# Patient Record
Sex: Female | Born: 1940
Health system: Southern US, Community
[De-identification: ages and names within clinical notes are randomized; demographics above are authoritative.]

## PROBLEM LIST (undated history)

## (undated) DIAGNOSIS — K589 Irritable bowel syndrome without diarrhea: Secondary | ICD-10-CM

## (undated) DIAGNOSIS — N189 Chronic kidney disease, unspecified: Secondary | ICD-10-CM

## (undated) DIAGNOSIS — R7301 Impaired fasting glucose: Secondary | ICD-10-CM

## (undated) DIAGNOSIS — K649 Unspecified hemorrhoids: Secondary | ICD-10-CM

## (undated) DIAGNOSIS — L409 Psoriasis, unspecified: Secondary | ICD-10-CM

## (undated) DIAGNOSIS — I10 Essential (primary) hypertension: Secondary | ICD-10-CM

## (undated) DIAGNOSIS — M1990 Unspecified osteoarthritis, unspecified site: Secondary | ICD-10-CM

## (undated) DIAGNOSIS — Z87442 Personal history of urinary calculi: Secondary | ICD-10-CM

## (undated) DIAGNOSIS — J4520 Mild intermittent asthma, uncomplicated: Secondary | ICD-10-CM

## (undated) DIAGNOSIS — K219 Gastro-esophageal reflux disease without esophagitis: Secondary | ICD-10-CM

## (undated) HISTORY — DX: Psoriasis, unspecified: L40.9

## (undated) HISTORY — DX: Unspecified hemorrhoids: K64.9

## (undated) HISTORY — PX: CHOLECYSTECTOMY: SHX55

## (undated) HISTORY — DX: Mild intermittent asthma, uncomplicated: J45.20

## (undated) HISTORY — PX: TONSILLECTOMY: SUR1361

## (undated) HISTORY — PX: HEMORRHOID SURGERY: SHX153

## (undated) HISTORY — DX: Irritable bowel syndrome, unspecified: K58.9

## (undated) HISTORY — DX: Unspecified osteoarthritis, unspecified site: M19.90

## (undated) HISTORY — DX: Gastro-esophageal reflux disease without esophagitis: K21.9

---
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
## 1985-06-09 HISTORY — PX: ABDOMINAL HYSTERECTOMY: SHX81

## 1997-10-23 ENCOUNTER — Ambulatory Visit (HOSPITAL_COMMUNITY): Admission: RE | Admit: 1997-10-23 | Discharge: 1997-10-23 | Payer: Self-pay | Admitting: Orthopedic Surgery

## 2009-06-12 DIAGNOSIS — M159 Polyosteoarthritis, unspecified: Secondary | ICD-10-CM | POA: Insufficient documentation

## 2010-02-25 DIAGNOSIS — J309 Allergic rhinitis, unspecified: Secondary | ICD-10-CM | POA: Insufficient documentation

## 2010-02-25 DIAGNOSIS — K219 Gastro-esophageal reflux disease without esophagitis: Secondary | ICD-10-CM | POA: Insufficient documentation

## 2010-02-25 DIAGNOSIS — L409 Psoriasis, unspecified: Secondary | ICD-10-CM | POA: Insufficient documentation

## 2010-05-23 DIAGNOSIS — N183 Chronic kidney disease, stage 3 unspecified: Secondary | ICD-10-CM | POA: Insufficient documentation

## 2010-05-23 DIAGNOSIS — I10 Essential (primary) hypertension: Secondary | ICD-10-CM | POA: Insufficient documentation

## 2010-06-20 ENCOUNTER — Encounter
Admission: RE | Admit: 2010-06-20 | Discharge: 2010-06-20 | Payer: Self-pay | Source: Home / Self Care | Attending: Family Medicine | Admitting: Family Medicine

## 2010-07-01 ENCOUNTER — Encounter
Admission: RE | Admit: 2010-07-01 | Discharge: 2010-07-01 | Payer: Self-pay | Source: Home / Self Care | Attending: Family Medicine | Admitting: Family Medicine

## 2010-07-08 DIAGNOSIS — K580 Irritable bowel syndrome with diarrhea: Secondary | ICD-10-CM | POA: Insufficient documentation

## 2011-06-25 ENCOUNTER — Other Ambulatory Visit: Payer: Self-pay | Admitting: Family Medicine

## 2011-06-25 DIAGNOSIS — L719 Rosacea, unspecified: Secondary | ICD-10-CM | POA: Insufficient documentation

## 2011-06-25 DIAGNOSIS — Z1231 Encounter for screening mammogram for malignant neoplasm of breast: Secondary | ICD-10-CM

## 2011-07-04 ENCOUNTER — Ambulatory Visit: Payer: Self-pay

## 2011-07-04 ENCOUNTER — Ambulatory Visit
Admission: RE | Admit: 2011-07-04 | Discharge: 2011-07-04 | Disposition: A | Discharge status: Home / Self Care | Payer: Medicare Other | Source: Ambulatory Visit | Attending: Family Medicine | Admitting: Family Medicine

## 2011-07-04 DIAGNOSIS — Z1231 Encounter for screening mammogram for malignant neoplasm of breast: Secondary | ICD-10-CM

## 2011-09-22 DIAGNOSIS — E781 Pure hyperglyceridemia: Secondary | ICD-10-CM | POA: Insufficient documentation

## 2012-07-02 ENCOUNTER — Other Ambulatory Visit: Payer: Self-pay | Admitting: Family Medicine

## 2012-07-02 DIAGNOSIS — Z780 Asymptomatic menopausal state: Secondary | ICD-10-CM

## 2012-07-02 DIAGNOSIS — Z1231 Encounter for screening mammogram for malignant neoplasm of breast: Secondary | ICD-10-CM

## 2012-07-05 ENCOUNTER — Ambulatory Visit
Admission: RE | Admit: 2012-07-05 | Discharge: 2012-07-05 | Disposition: A | Discharge status: Home / Self Care | Payer: Medicare Other | Source: Ambulatory Visit | Attending: Family Medicine | Admitting: Family Medicine

## 2012-07-05 DIAGNOSIS — Z780 Asymptomatic menopausal state: Secondary | ICD-10-CM

## 2012-07-05 DIAGNOSIS — Z1231 Encounter for screening mammogram for malignant neoplasm of breast: Secondary | ICD-10-CM

## 2012-10-01 DIAGNOSIS — H1045 Other chronic allergic conjunctivitis: Secondary | ICD-10-CM | POA: Insufficient documentation

## 2013-04-06 ENCOUNTER — Other Ambulatory Visit: Payer: Self-pay | Admitting: Family Medicine

## 2013-04-06 DIAGNOSIS — Z1231 Encounter for screening mammogram for malignant neoplasm of breast: Secondary | ICD-10-CM

## 2013-06-09 HISTORY — PX: CATARACT EXTRACTION W/ INTRAOCULAR LENS  IMPLANT, BILATERAL: SHX1307

## 2013-07-07 ENCOUNTER — Ambulatory Visit
Admission: RE | Admit: 2013-07-07 | Discharge: 2013-07-07 | Disposition: A | Discharge status: Home / Self Care | Payer: Medicare Other | Source: Ambulatory Visit | Attending: Family Medicine | Admitting: Family Medicine

## 2013-07-07 DIAGNOSIS — Z1231 Encounter for screening mammogram for malignant neoplasm of breast: Secondary | ICD-10-CM

## 2014-10-05 ENCOUNTER — Other Ambulatory Visit: Payer: Self-pay | Admitting: Family Medicine

## 2014-10-05 DIAGNOSIS — Z1231 Encounter for screening mammogram for malignant neoplasm of breast: Secondary | ICD-10-CM

## 2014-12-05 ENCOUNTER — Encounter (INDEPENDENT_AMBULATORY_CARE_PROVIDER_SITE_OTHER): Payer: Self-pay

## 2014-12-05 ENCOUNTER — Ambulatory Visit
Admission: RE | Admit: 2014-12-05 | Discharge: 2014-12-05 | Disposition: A | Discharge status: Home / Self Care | Payer: Medicare Other | Source: Ambulatory Visit | Attending: Family Medicine | Admitting: Family Medicine

## 2014-12-05 DIAGNOSIS — Z1231 Encounter for screening mammogram for malignant neoplasm of breast: Secondary | ICD-10-CM

## 2015-04-16 DIAGNOSIS — E669 Obesity, unspecified: Secondary | ICD-10-CM | POA: Insufficient documentation

## 2016-03-07 ENCOUNTER — Other Ambulatory Visit: Payer: Self-pay | Admitting: Family Medicine

## 2016-03-07 DIAGNOSIS — I10 Essential (primary) hypertension: Secondary | ICD-10-CM

## 2016-03-07 DIAGNOSIS — R0609 Other forms of dyspnea: Secondary | ICD-10-CM

## 2016-03-21 ENCOUNTER — Other Ambulatory Visit: Payer: Self-pay | Admitting: Family Medicine

## 2016-03-21 DIAGNOSIS — R0602 Shortness of breath: Secondary | ICD-10-CM

## 2016-03-21 DIAGNOSIS — R9431 Abnormal electrocardiogram [ECG] [EKG]: Secondary | ICD-10-CM

## 2016-03-26 ENCOUNTER — Telehealth (HOSPITAL_COMMUNITY): Payer: Self-pay | Admitting: *Deleted

## 2016-03-26 NOTE — Telephone Encounter (Signed)
Patient given detailed instructions per Myocardial Perfusion Study Information Sheet for the test on 03/31/16 Patient notified to arrive 15 minutes early and that it is imperative to arrive on time for appointment to keep from having the test rescheduled.  If you need to cancel or reschedule your appointment, please call the office within 24 hours of your appointment. Failure to do so may result in a cancellation of your appointment, and a $50 no show fee. Patient verbalized understanding   Kirstie Peri

## 2016-03-31 ENCOUNTER — Other Ambulatory Visit (HOSPITAL_COMMUNITY): Payer: BLUE CROSS/BLUE SHIELD

## 2016-03-31 ENCOUNTER — Ambulatory Visit (HOSPITAL_COMMUNITY): Payer: Medicare Other | Attending: Cardiovascular Disease

## 2016-03-31 DIAGNOSIS — R0602 Shortness of breath: Secondary | ICD-10-CM | POA: Diagnosis not present

## 2016-03-31 DIAGNOSIS — R0600 Dyspnea, unspecified: Secondary | ICD-10-CM | POA: Insufficient documentation

## 2016-03-31 DIAGNOSIS — R9431 Abnormal electrocardiogram [ECG] [EKG]: Secondary | ICD-10-CM | POA: Diagnosis not present

## 2016-03-31 MED ORDER — TECHNETIUM TC 99M TETROFOSMIN IV KIT
32.8000 | PACK | Freq: Once | INTRAVENOUS | Status: AC | PRN
  Administered 2016-03-31: 32.8 via INTRAVENOUS
  Filled 2016-03-31: qty 33

## 2016-03-31 MED ORDER — REGADENOSON 0.4 MG/5ML IV SOLN
0.4000 mg | Freq: Once | INTRAVENOUS | Status: AC
  Administered 2016-03-31: 0.4 mg via INTRAVENOUS

## 2016-04-01 ENCOUNTER — Ambulatory Visit (HOSPITAL_COMMUNITY): Payer: Medicare Other | Attending: Cardiology

## 2016-04-01 LAB — MYOCARDIAL PERFUSION IMAGING
CHL CUP NUCLEAR SSS: 8
CHL CUP RESTING HR STRESS: 65 {beats}/min
CSEPPHR: 83 {beats}/min
LHR: 0.39
SDS: 6
SRS: 4
TID: 0.87

## 2016-04-01 MED ORDER — TECHNETIUM TC 99M TETROFOSMIN IV KIT
31.7000 | PACK | Freq: Once | INTRAVENOUS | Status: AC | PRN
Start: 2016-04-01 — End: 2016-04-01
  Administered 2016-04-01: 31.7 via INTRAVENOUS
  Filled 2016-04-01: qty 32

## 2016-10-11 ENCOUNTER — Encounter (HOSPITAL_COMMUNITY): Payer: Self-pay | Admitting: Emergency Medicine

## 2016-10-11 ENCOUNTER — Emergency Department (HOSPITAL_COMMUNITY)
Admission: EM | Admit: 2016-10-11 | Discharge: 2016-10-11 | Disposition: A | Discharge status: Home / Self Care | Payer: Medicare Other | Attending: Emergency Medicine | Admitting: Emergency Medicine

## 2016-10-11 DIAGNOSIS — N12 Tubulo-interstitial nephritis, not specified as acute or chronic: Secondary | ICD-10-CM | POA: Diagnosis not present

## 2016-10-11 DIAGNOSIS — N179 Acute kidney failure, unspecified: Secondary | ICD-10-CM | POA: Diagnosis not present

## 2016-10-11 DIAGNOSIS — N390 Urinary tract infection, site not specified: Secondary | ICD-10-CM | POA: Diagnosis not present

## 2016-10-11 DIAGNOSIS — Z7982 Long term (current) use of aspirin: Secondary | ICD-10-CM | POA: Insufficient documentation

## 2016-10-11 DIAGNOSIS — I10 Essential (primary) hypertension: Secondary | ICD-10-CM | POA: Insufficient documentation

## 2016-10-11 DIAGNOSIS — R509 Fever, unspecified: Secondary | ICD-10-CM | POA: Diagnosis present

## 2016-10-11 HISTORY — DX: Essential (primary) hypertension: I10

## 2016-10-11 LAB — COMPREHENSIVE METABOLIC PANEL
ALT: 14 U/L (ref 14–54)
ANION GAP: 10 (ref 5–15)
AST: 28 U/L (ref 15–41)
Albumin: 3.6 g/dL (ref 3.5–5.0)
Alkaline Phosphatase: 58 U/L (ref 38–126)
BILIRUBIN TOTAL: 1.4 mg/dL — AB (ref 0.3–1.2)
BUN: 33 mg/dL — AB (ref 6–20)
CHLORIDE: 107 mmol/L (ref 101–111)
CO2: 19 mmol/L — ABNORMAL LOW (ref 22–32)
Calcium: 9.3 mg/dL (ref 8.9–10.3)
Creatinine, Ser: 2.24 mg/dL — ABNORMAL HIGH (ref 0.44–1.00)
GFR, EST AFRICAN AMERICAN: 23 mL/min — AB (ref >60)
GFR, EST NON AFRICAN AMERICAN: 20 mL/min — AB (ref >60)
Glucose, Bld: 104 mg/dL — ABNORMAL HIGH (ref 65–99)
POTASSIUM: 4.4 mmol/L (ref 3.5–5.1)
Sodium: 136 mmol/L (ref 135–145)
TOTAL PROTEIN: 7.4 g/dL (ref 6.5–8.1)

## 2016-10-11 LAB — URINALYSIS, ROUTINE W REFLEX MICROSCOPIC
BILIRUBIN URINE: NEGATIVE
GLUCOSE, UA: NEGATIVE mg/dL
KETONES UR: NEGATIVE mg/dL
Nitrite: NEGATIVE
PH: 5 (ref 5.0–8.0)
PROTEIN: 100 mg/dL — AB
Specific Gravity, Urine: 1.015 (ref 1.005–1.030)

## 2016-10-11 LAB — CBC WITH DIFFERENTIAL/PLATELET
BASOS ABS: 0 10*3/uL (ref 0.0–0.1)
Basophils Relative: 0 %
EOS PCT: 1 %
Eosinophils Absolute: 0.1 10*3/uL (ref 0.0–0.7)
HCT: 37.3 % (ref 36.0–46.0)
HEMOGLOBIN: 12.3 g/dL (ref 12.0–15.0)
LYMPHS ABS: 2.4 10*3/uL (ref 0.7–4.0)
LYMPHS PCT: 19 %
MCH: 31.5 pg (ref 26.0–34.0)
MCHC: 33 g/dL (ref 30.0–36.0)
MCV: 95.4 fL (ref 78.0–100.0)
Monocytes Absolute: 1 10*3/uL (ref 0.1–1.0)
Monocytes Relative: 8 %
NEUTROS PCT: 72 %
Neutro Abs: 9.2 10*3/uL — ABNORMAL HIGH (ref 1.7–7.7)
PLATELETS: 188 10*3/uL (ref 150–400)
RBC: 3.91 MIL/uL (ref 3.87–5.11)
RDW: 13.5 % (ref 11.5–15.5)
WBC: 12.7 10*3/uL — AB (ref 4.0–10.5)

## 2016-10-11 LAB — I-STAT CG4 LACTIC ACID, ED: LACTIC ACID, VENOUS: 1.25 mmol/L (ref 0.5–1.9)

## 2016-10-11 MED ORDER — DEXTROSE 5 % IV SOLN
1.0000 g | Freq: Once | INTRAVENOUS | Status: AC
  Administered 2016-10-11: 1 g via INTRAVENOUS
  Filled 2016-10-11: qty 10

## 2016-10-11 MED ORDER — CEPHALEXIN 500 MG PO CAPS: 500.0000 mg | ORAL_CAPSULE | Freq: Three times a day (TID) | ORAL | 0 refills | Status: AC

## 2016-10-11 MED ORDER — SODIUM CHLORIDE 0.9 % IV BOLUS (SEPSIS)
1000.0000 mL | Freq: Once | INTRAVENOUS | Status: AC
  Administered 2016-10-11: 1000 mL via INTRAVENOUS

## 2016-10-11 NOTE — ED Provider Notes (Signed)
[facility] DEPT Provider Note   CSN: [id number] Arrival date & time: 10/11/16  1742     History   Chief Complaint Chief Complaint  Patient presents with  . Urinary Tract Infection  . Fever    HPI Sabrina Ramirez is a 76 y.o. female.  The history is provided by the patient.  Dysuria   This is a new problem. Episode onset: symptoms started 6 days ago. The problem occurs every urination. The problem has been gradually worsening. The quality of the pain is described as burning. The pain is moderate. The maximum temperature recorded prior to her arrival was 101 to 101.9 F (fevers started yesterday). The fever has been present for 1 to 2 days. There is no history of pyelonephritis. Associated symptoms include chills and urgency. Pertinent negatives include no nausea, no vomiting, no discharge and no flank pain. She has tried nothing for the symptoms. Her past medical history is significant for kidney stones (but this does not feel like prior kidney stones).    Past Medical History:  Diagnosis Date  . Hypertension   . Renal disorder     There are no active problems to display for this patient.   Past Surgical History:  Procedure Laterality Date  . CHOLECYSTECTOMY      OB History    No data available       Home Medications    Prior to Admission medications   Medication Sig Start Date End Date Taking? Authorizing Provider  aspirin EC 81 MG tablet Take 81 mg by mouth daily.   Yes [provider]  cetirizine (ZYRTEC) 10 MG tablet Take 10 mg by mouth daily.   Yes [provider]  cholecalciferol (VITAMIN D) 400 units TABS tablet Take 400 Units by mouth daily.   Yes [provider]  CINNAMON PO Take by mouth daily.   Yes [provider]  dextromethorphan (DELSYM) 30 MG/5ML liquid Take 15 mg by mouth as needed for cough.   Yes [provider]  meloxicam (MOBIC) 15 MG tablet Take 15 mg by mouth 2 (two) times daily. 08/25/16  Yes  [provider]  metoprolol succinate (TOPROL-XL) 100 MG 24 hr tablet Take 100 mg by mouth daily. 08/14/16  Yes [provider]  montelukast (SINGULAIR) 10 MG tablet Take 10 mg by mouth daily. 08/14/16  Yes [provider]  nystatin cream (MYCOSTATIN) Apply 1 application topically 2 (two) times daily.   Yes [provider]  omeprazole (PRILOSEC) 20 MG capsule Take 20 mg by mouth daily.   Yes [provider]  traMADol (ULTRAM) 50 MG tablet Take 50 mg by mouth every 6 (six) hours as needed.   Yes [provider]  triamcinolone cream (KENALOG) 0.1 % Apply 1 application topically 2 (two) times daily.   Yes [provider]  TURMERIC PO Take by mouth daily.   Yes [provider]  cephALEXin (KEFLEX) 500 MG capsule Take 1 capsule (500 mg total) by mouth 3 (three) times daily. 10/11/16 10/21/16  Heriberto Antigua, MD    Family History No family history on file.  Social History Social History  Substance Use Topics  . Smoking status: Not on file  . Smokeless tobacco: Not on file  . Alcohol use Not on file     Allergies   Amlodipine; Corn-containing products; Ace inhibitors; Angiotensin receptor blockers; Iodine; and Triamterene-hctz   Review of Systems Review of Systems  Constitutional: Positive for chills and fever.  HENT: Negative.  Respiratory: Negative for cough and shortness of breath.   Cardiovascular: Negative for chest pain.  Gastrointestinal: Positive for abdominal pain (suprapubic). Negative for nausea and vomiting.  Genitourinary: Positive for dysuria and urgency. Negative for flank pain.  Musculoskeletal: Negative.   Neurological: Negative.   All other systems reviewed and are negative.    Physical Exam Updated Vital Signs BP (!) 121/55   Pulse 87   Temp 99.1 F (37.3 C) (Oral)   Resp 16   SpO2 98%   Physical Exam  Constitutional: She appears well-developed and well-nourished. No distress.  HENT:    Head: Normocephalic and atraumatic.  Mouth/Throat: Oropharynx is clear and moist.  Eyes: Conjunctivae and EOM are normal.  Neck: Neck supple.  Cardiovascular: Normal rate, regular rhythm, normal heart sounds and intact distal pulses.   No murmur heard. Pulmonary/Chest: Effort normal and breath sounds normal. No respiratory distress.  Abdominal: Soft. There is tenderness (mild suprapubic ttp).  No flank pain   Musculoskeletal: She exhibits no edema.  Neurological: She is alert. She exhibits normal muscle tone. Coordination normal.  Skin: Skin is warm and dry.  Psychiatric: She has a normal mood and affect.  Nursing note and vitals reviewed.    ED Treatments / Results  Labs (all labs ordered are listed, but only abnormal results are displayed) Labs Reviewed  COMPREHENSIVE METABOLIC PANEL - Abnormal; Notable for the following:       Result Value   CO2 19 (*)    Glucose, Bld 104 (*)    BUN 33 (*)    Creatinine, Ser 2.24 (*)    Total Bilirubin 1.4 (*)    GFR calc non Af Amer 20 (*)    GFR calc Af Amer 23 (*)    All other components within normal limits  CBC WITH DIFFERENTIAL/PLATELET - Abnormal; Notable for the following:    WBC 12.7 (*)    Neutro Abs 9.2 (*)    All other components within normal limits  URINALYSIS, ROUTINE W REFLEX MICROSCOPIC - Abnormal; Notable for the following:    APPearance HAZY (*)    Hgb urine dipstick MODERATE (*)    Protein, ur 100 (*)    Leukocytes, UA LARGE (*)    Bacteria, UA FEW (*)    Squamous Epithelial / LPF 6-30 (*)    All other components within normal limits  URINE CULTURE  I-STAT CG4 LACTIC ACID, ED    EKG  EKG Interpretation None       Radiology No results found.  Procedures Procedures (including critical care time)  Medications Ordered in ED Medications  sodium chloride 0.9 % bolus 1,000 mL (0 mLs Intravenous Stopped 10/11/16 2129)  cefTRIAXone (ROCEPHIN) 1 g in dextrose 5 % 50 mL IVPB (0 g Intravenous Stopped 10/11/16  2129)     Initial Impression / Assessment and Plan / ED Course  I have reviewed the triage vital signs and the nursing notes.  Pertinent labs & imaging results that were available during my care of the patient were reviewed by me and considered in my medical decision making (see chart for details).    EMERGENCY DEPARTMENT US RENAL EXAM  "Study: Limited Retroperitoneal Ultrasound of Kidneys"  INDICATIONS: concern for UTI and history of stone Long and short axis of both kidneys were obtained.   PERFORMED BY: Myself IMAGES ARCHIVED?: Yes LIMITATIONS: None VIEWS USED: Long axis, Short axis and Bladder  INTERPRETATION: No Hydronephrosis    Patient is a 76 year old female with above past medical history  presents with signs and symptoms consistent with a UTI. She does have a history of kidney stones but states this feels completely different. Bedside ultrasound done without evidence of hydronephrosis. Vital signs are unremarkable and exam as above. Labs consistent with the UTI and she does have an AKI with a creatinine of 2.24 with baseline around 1.6. She does admit to not drinking much fluid over the past few days and having dark urine. I have discussed this extensively with patient who wishes to be discharged and not be admitted for IV hydration. She is tolerating PO intake and has family at bedside who will push her to maintain adequate hydration in order to maintain clear urine. She agrees to follow up on Monday with her PCP for a repeat blood check. Given a dose of ceftriaxone here and will prescribe Keflex. Shared decision making was used to agree upon discharge.   I have reviewed all labs. Patient stable for discharge home.  I have reviewed all results with the patient. Advised to f/u Monday with pcp. Patient agrees to stated plan. All questions answered. Advised to call or return to have any questions, new symptoms, change in symptoms, or symptoms that they do not understand.  Final  Clinical Impressions(s) / ED Diagnoses   Final diagnoses:  Urinary tract infection without hematuria, site unspecified  AKI (acute kidney injury) ([place])  Pyelonephritis    New Prescriptions Discharge Medication List as of 10/11/2016  9:21 PM    START taking these medications   Details  cephALEXin (KEFLEX) 500 MG capsule Take 1 capsule (500 mg total) by mouth 3 (three) times daily., Starting Sat 10/11/2016, Until Tue 10/21/2016, Print         Heriberto Antigua, MD 10/12/16 1816    Elnora Morrison, MD 10/13/16 [phone]

## 2016-10-11 NOTE — ED Triage Notes (Signed)
Pt reports diagnosed with kidney stone, has been straining her urine. Pt reports small sand like pieces in her strainer on Monday. Pt reports Wednesday began having increasing chills with fever, last night had a temp of 102. Pt denies flank pain at present but reports her urethral area is irritated. Denies painful urination. Just reports it is irritated all the time. Pt also reports 2 episodes of diarrhea today. Pt took an imodium for this.

## 2016-10-11 NOTE — Discharge Instructions (Signed)
Please have adequate hydration until urine is a clear light yellow tenderness. Please follow-up with your primary care doctor for repeat blood check on Monday or Tuesday. Call or return to have any questions, new symptoms, change in symptoms, or symptoms that they do not understand.

## 2016-10-14 LAB — URINE CULTURE

## 2016-10-15 ENCOUNTER — Telehealth: Payer: Self-pay | Admitting: Emergency Medicine

## 2016-10-15 NOTE — Telephone Encounter (Signed)
Post ED Visit - Positive Culture Follow-up  Culture report reviewed by antimicrobial stewardship pharmacist:  [x]  Elenor Quinones, Pharm.D. []  Heide Guile, Pharm.D., BCPS AQ-ID []  Parks Neptune, Pharm.D., BCPS []  Alycia Rossetti, Pharm.D., BCPS []  [place], [place].D., BCPS, AAHIVP []  Legrand Como, Pharm.D., BCPS, AAHIVP []  Salome Arnt, PharmD, BCPS []  Dimitri Ped, PharmD, BCPS []  Vincenza Hews, PharmD, BCPS  Positive urine culture Treated with cephalexin, organism sensitive to the same and no further patient follow-up is required at this time.  Hazle Nordmann 10/15/2016, 12:22 PM

## 2017-01-23 LAB — LIPID PANEL: HDL: 45 (ref 35–70)

## 2017-05-15 ENCOUNTER — Other Ambulatory Visit: Payer: Self-pay | Admitting: *Deleted

## 2017-05-15 ENCOUNTER — Encounter: Payer: Self-pay | Admitting: *Deleted

## 2017-05-18 ENCOUNTER — Ambulatory Visit: Payer: Medicare Other | Admitting: Family Medicine

## 2017-05-25 ENCOUNTER — Ambulatory Visit (INDEPENDENT_AMBULATORY_CARE_PROVIDER_SITE_OTHER): Payer: Medicare Other | Admitting: Family Medicine

## 2017-05-25 ENCOUNTER — Encounter: Payer: Self-pay | Admitting: Family Medicine

## 2017-05-25 VITALS — BP 136/68 | HR 68 | Temp 98.4°F | Ht 63.5 in | Wt 198.5 lb

## 2017-05-25 DIAGNOSIS — E669 Obesity, unspecified: Secondary | ICD-10-CM

## 2017-05-25 DIAGNOSIS — G43109 Migraine with aura, not intractable, without status migrainosus: Secondary | ICD-10-CM | POA: Insufficient documentation

## 2017-05-25 DIAGNOSIS — M25532 Pain in left wrist: Secondary | ICD-10-CM

## 2017-05-25 DIAGNOSIS — I10 Essential (primary) hypertension: Secondary | ICD-10-CM | POA: Diagnosis not present

## 2017-05-25 DIAGNOSIS — M25531 Pain in right wrist: Secondary | ICD-10-CM | POA: Diagnosis not present

## 2017-05-25 DIAGNOSIS — G250 Essential tremor: Secondary | ICD-10-CM

## 2017-05-25 DIAGNOSIS — J4520 Mild intermittent asthma, uncomplicated: Secondary | ICD-10-CM | POA: Diagnosis not present

## 2017-05-25 DIAGNOSIS — M159 Polyosteoarthritis, unspecified: Secondary | ICD-10-CM

## 2017-05-25 DIAGNOSIS — K635 Polyp of colon: Secondary | ICD-10-CM | POA: Insufficient documentation

## 2017-05-25 DIAGNOSIS — M150 Primary generalized (osteo)arthritis: Secondary | ICD-10-CM | POA: Diagnosis not present

## 2017-05-25 DIAGNOSIS — N183 Chronic kidney disease, stage 3 unspecified: Secondary | ICD-10-CM

## 2017-05-25 DIAGNOSIS — J4530 Mild persistent asthma, uncomplicated: Secondary | ICD-10-CM | POA: Insufficient documentation

## 2017-05-25 HISTORY — DX: Mild intermittent asthma, uncomplicated: J45.20

## 2017-05-25 MED ORDER — SIMVASTATIN 20 MG PO TABS: 20.0000 mg | ORAL_TABLET | Freq: Every day | ORAL | 3 refills | Status: DC

## 2017-05-25 NOTE — Patient Instructions (Signed)
It was so good seeing you again! Thank you for establishing with my new practice and allowing me to continue caring for you. It means a lot to me.   Please schedule a follow up appointment with me in 3 months to f/u on blood pressure medications and wheezing.  We will call you with information regarding your referral appointment. Dr. Lynann Bologna

## 2017-05-25 NOTE — Progress Notes (Signed)
Subjective  CC:  Chief Complaint  Patient presents with  . Establish Care    Transfer from [facility]  . Hypertension  . Chronic Kidney Disease    HPI: Sabrina Ramirez is a 76 y.o. female who presents to the office today to address the problems listed above in the chief complaint. She is a former [facility] patient and is here to reestablish care with me today.    Hypertension f/u: Control is good . Pt reports she is doing well. taking medications as instructed, no medication side effects noted, patient does not perform home BP monitoring, no TIAs, no chest pain on exertion, no dyspnea on exertion, no swelling of ankles. She is on a beta-blocker in spite of new dx of allergic asthma: see below. Has had multiple allergies to other bp meds (cough).  She denies adverse effects from her BP medications. Compliance with medication is good.   Allergic asthma: discussed current increased use of albuterol due to mold, and weather triggering allergies. Albuterol helps as soon as she uses it. She has f/u with allergy in a month. Using recently almost 4-5x/month but   OA: back pain is limiting her function. As well complains of hand pain (1st mcp oa) and wrist pain with shooting needle pain into hand, typically with holding things or bell playing. Has had xrays, and PT with some relief. In august had steroid trigger injection which gave 1-2 months of relief. Uses otc meds. No radicular sxs. No nighttime sxs.   CKD fu: no sxs of fluid overload. Last creat 1.96, nl sodium and potassium, calcium.GFR 25%  Prediabetes: last a1c 6.1 in august. No sxs of hyperglycemia  Last ldl 137, tot chol 182 and last hdl 45; never on meds. + FH CAD. Denies chest pain but admits to getting winded with walking. Rest helps. reviewed prior cards eval. Nl TSH  BP Readings from Last 3 Encounters:  05/25/17 136/68  10/11/16 (!) 121/55   Wt Readings from Last 3 Encounters:  05/25/17 198 lb 8 oz (90 kg)  03/31/16 199 lb (90.3 kg)      No results found for: CHOL Lab Results  Component Value Date   HDL 45 01/23/2017   The 10-year ASCVD risk score Mikey Bussing DC Jr., et al., 2013) is: 25.5%*   Values used to calculate the score:     Age: 32 years     Sex: Female     Is Non-Hispanic African American: No     Diabetic: No     Tobacco smoker: No     Systolic Blood Pressure: 818 mmHg     Is BP treated: Yes     HDL Cholesterol: 45 mg/dL*     Total Cholesterol: 182 mg/dL     * - Cholesterol units were assumed for this score calculation  I reviewed the patients updated PMH, FH, and SocHx.    Patient Active Problem List   Diagnosis Date Noted  . Obesity, Class I, BMI 30-34.9 04/16/2015    Priority: High  . Hypertriglyceridemia 09/22/2011    Priority: High  . Benign essential hypertension 05/23/2010    Priority: High  . Colon polyp 05/25/2017    Priority: Medium  . Ophthalmic migraine 05/25/2017    Priority: Medium  . Essential tremor 05/25/2017    Priority: Medium  . Irritable bowel syndrome with diarrhea 07/08/2010    Priority: Medium  . CKD (chronic kidney disease) stage 3, GFR 30-59 ml/min (HCC) 05/23/2010    Priority: Medium  .  Chronic allergic rhinitis 02/25/2010    Priority: Medium  . GERD (gastroesophageal reflux disease) 02/25/2010    Priority: Medium  . Osteoarthritis, multiple sites 06/12/2009    Priority: Medium  . Conjunctivitis, allergic, chronic 10/01/2012    Priority: Low  . Rosacea 06/25/2011    Priority: Low  . Psoriasis 02/25/2010    Priority: Low  . Allergic asthma, mild intermittent, uncomplicated 67/34/1937    Allergies: Amlodipine; Corn-containing products; Ace inhibitors; Angiotensin receptor blockers; Iodine; and Triamterene-hctz  Social History: Patient  reports that  has never smoked. she has never used smokeless tobacco. She reports that she drinks alcohol. She reports that she does not use drugs.  Current Meds  Medication Sig  . Albuterol Sulfate (PROAIR RESPICLICK) 902  (90 Base) MCG/ACT AEPB Inhale into the lungs.  Marland Kitchen aspirin EC 81 MG tablet Take 81 mg by mouth daily.  Marland Kitchen azelastine (ASTELIN) 0.1 % nasal spray Place 1 spray into the nose daily as needed.   . cetirizine (ZYRTEC) 10 MG tablet Take 10 mg by mouth daily.  Marland Kitchen CINNAMON PO Take by mouth daily. 1/4 teaspoon in coffee grounds  . fluocinonide cream (LIDEX) 0.05 % APPLY SPARINGLY  TO AFFECTED AREA THREE TIMES DAILY for flares. Do not use longer than 2 weeks.  . Glucosamine-Chondroit-Vit C-Mn (GLUCOSAMINE CHONDR 1500 COMPLX PO) Take 2 tablets by mouth daily.  . metoprolol succinate (TOPROL-XL) 100 MG 24 hr tablet Take 100 mg by mouth daily.  . montelukast (SINGULAIR) 10 MG tablet Take 10 mg by mouth daily.  . Multiple Vitamin (MULTIVITAMIN) tablet Take by mouth.  . nystatin cream (MYCOSTATIN) Apply 1 application topically 2 (two) times daily.  Marland Kitchen omeprazole (PRILOSEC) 20 MG capsule Take 20 mg by mouth daily.  . traMADol (ULTRAM) 50 MG tablet Take 50 mg by mouth every 6 (six) hours as needed.  . triamcinolone cream (KENALOG) 0.1 % Apply 1 application topically 2 (two) times daily.  . TURMERIC PO Take by mouth daily.  . Vitamin D, Ergocalciferol, 2000 units CAPS Take 1 capsule by mouth daily.    Review of Systems: Cardiovascular: negative for chest pain, palpitations, leg swelling, orthopnea Respiratory: negative for SOB, wheezing or persistent cough Gastrointestinal: negative for abdominal pain Genitourinary: negative for dysuria or gross hematuria  Objective  Vitals: BP 136/68 (BP Location: Left Arm, Patient Position: Sitting, Cuff Size: Large)   Pulse 68   Temp 98.4 F (36.9 C) (Oral)   Ht 5' 3.5" (1.613 m)   Wt 198 lb 8 oz (90 kg)   SpO2 96%   BMI 34.61 kg/m  General: no acute distress  Psych:  Alert and oriented, normal mood and affect HEENT:  Normocephalic, atraumatic, supple neck  Cardiovascular:  RRR without murmur. no edema Respiratory:  Good breath sounds bilaterally, CTAB with normal  respiratory effort Skin:  Warm, no rashes Neurologic:   Mental status is normal MSK: neg phalens, Bil 1st MCP changes and ttp, nl appearing other hand joints, nl grip and hand strength, essential tremor present  Assessment  1. Benign essential hypertension   2. CKD (chronic kidney disease) stage 3, GFR 30-59 ml/min (HCC)   3. Obesity, Class I, BMI 30-34.9   4. Primary osteoarthritis involving multiple joints   5. Essential tremor   6. Allergic asthma, mild intermittent, uncomplicated   7. Pain in both wrists      Plan    Hypertension f/u: BP control is well controlled. Monitor on bb due to h/o asthma. Monitor for cp or palpitations:  low threshold for cardiac stress testing. Pt declines at this time  Hyperlipidemia f/u: start statin due to elevated ascvd risk. Education given. Recheck 3 months. Work on low cholesterol diet. Recommended exercise program: silver sneaker  SOB/deconditioning: discussed this - start working on exercise and endurance. Cardiac eval IF not improving or worsens with exercise.   CKD: stable.avoid renal toxins. Recheck q 6 months. Check urine protein next visit. Likely htn nephrosclerosis.   Allergic astham: continue current meds; limit albuterol as needed.   OA and wrist pain: refer to ortho for eval and treatment options. Likely carpal tunnel as well.  Education regarding management of these chronic disease states was given. Management strategies discussed on successive visits include dietary and exercise recommendations, goals of achieving and maintaining IBW, and lifestyle modifications aiming for adequate sleep and minimizing stressors.   Follow up: 3 months to recheck lipids, wheezing and htn meds.   Commons side effects, risks, benefits, and alternatives for medications and treatment plan prescribed today were discussed, and the patient expressed understanding of the given instructions. Patient is instructed to call or message via MyChart if he/she has  any questions or concerns regarding our treatment plan. No barriers to understanding were identified. We discussed Red Flag symptoms and signs in detail. Patient expressed understanding regarding what to do in case of urgent or emergency type symptoms.   Medication list was reconciled, printed and provided to the patient in AVS. Patient instructions and summary information was reviewed with the patient as documented in the AVS. This note was prepared with assistance of Dragon voice recognition software. Occasional wrong-word or sound-a-like substitutions may have occurred due to the inherent limitations of voice recognition software  Orders Placed This Encounter  Procedures  . Lipid panel  . Ambulatory referral to Orthopedic Surgery   Meds ordered this encounter  Medications  . simvastatin (ZOCOR) 20 MG tablet    Sig: Take 1 tablet (20 mg total) by mouth at bedtime.    Dispense:  90 tablet    Refill:  3

## 2017-06-15 ENCOUNTER — Other Ambulatory Visit: Payer: Self-pay | Admitting: Orthopedic Surgery

## 2017-06-15 DIAGNOSIS — M545 Low back pain: Secondary | ICD-10-CM

## 2017-06-23 ENCOUNTER — Ambulatory Visit
Admission: RE | Admit: 2017-06-23 | Discharge: 2017-06-23 | Disposition: A | Discharge status: Home / Self Care | Payer: Medicare Other | Source: Ambulatory Visit | Attending: Orthopedic Surgery | Admitting: Orthopedic Surgery

## 2017-06-23 DIAGNOSIS — M545 Low back pain: Secondary | ICD-10-CM

## 2017-07-10 HISTORY — PX: SPINE SURGERY: SHX786

## 2017-07-23 ENCOUNTER — Other Ambulatory Visit: Payer: Self-pay | Admitting: Neurosurgery

## 2017-08-05 NOTE — Pre-Procedure Instructions (Signed)
Sabrina Ramirez  08/05/2017      [facility] ([facility]), [place] [facility] [place] [address] [place] ([place] [address] Phone: [phone] Fax: [phone]    Your procedure is scheduled on  Thursday  08/13/17  Report to [facility] Admitting at 1045 A.M.  Call this number if you have problems the morning of surgery:  [phone]   Remember:  Do not eat food or drink liquids after midnight.  Take these medicines the morning of surgery with A SIP OF WATER- ALBUTEROL INHALER (BRING WITH YOU), BREO- ELLIPTA INHALER, METOPROLOL (TOPROL), OMEPRAZOLE (PRILOSEC)  7 days prior to surgery STOP taking any Aspirin(unless otherwise instructed by your surgeon), Aleve, Naproxen, Ibuprofen, Motrin, Advil, Goody's, BC's, all herbal medications, fish oil, and all vitamins, TURMERIC, CINNAMON   Do not wear jewelry, make-up or nail polish.  Do not wear lotions, powders, or perfumes, or deodorant.  Do not shave 48 hours prior to surgery.  Men may shave face and neck.  Do not bring valuables to the hospital.  [facility] is not responsible for any belongings or valuables.  Contacts, dentures or bridgework may not be worn into surgery.  Leave your suitcase in the car.  After surgery it may be brought to your room.  For patients admitted to the hospital, discharge time will be determined by your treatment team.  Patients discharged the [place] of surgery will not be allowed to drive home.   Name and phone number of your driver:    Special instructions:  [facility] - Preparing for Surgery  Before surgery, you can play an important role.  Because skin is not sterile, your skin needs to be as free of germs as possible.  You can reduce the number of germs on you skin by washing with CHG (chlorahexidine gluconate) soap before surgery.  CHG is an antiseptic cleaner which kills germs and bonds with the skin to continue killing germs even after washing.  Please DO NOT  use if you have an allergy to CHG or antibacterial soaps.  If your skin becomes reddened/irritated stop using the CHG and inform your nurse when you arrive at Short Stay.  Do not shave (including legs and underarms) for at least 48 hours prior to the first CHG shower.  You may shave your face.  Please follow these instructions carefully:   1.  Shower with CHG Soap the night before surgery and the                                morning of Surgery.  2.  If you choose to wash your hair, wash your hair first as usual with your       normal shampoo.  3.  After you shampoo, rinse your hair and body thoroughly to remove the                      Shampoo.  4.  Use CHG as you would any other liquid soap.  You can apply chg directly       to the skin and wash gently with scrungie or a clean washcloth.  5.  Apply the CHG Soap to your body ONLY FROM THE NECK DOWN.        Do not use on open wounds or open sores.  Avoid contact with your eyes,  ears, mouth and genitals (private parts).  Wash genitals (private parts)       with your normal soap.  6.  Wash thoroughly, paying special attention to the area where your surgery        will be performed.  7.  Thoroughly rinse your body with warm water from the neck down.  8.  DO NOT shower/wash with your normal soap after using and rinsing off       the CHG Soap.  9.  Pat yourself dry with a clean towel.            10.  Wear clean pajamas.            11.  Place clean sheets on your bed the night of your first shower and do not        sleep with pets.  [facility]  Do not apply any lotions/deoderants the morning of surgery.  Please wear clean clothes to the hospital/surgery center.    Please read over the following fact sheets that you were given. MRSA Information and Surgical Site Infection Prevention

## 2017-08-06 ENCOUNTER — Encounter (HOSPITAL_COMMUNITY)
Admission: RE | Admit: 2017-08-06 | Discharge: 2017-08-06 | Disposition: A | Discharge status: Home / Self Care | Payer: Medicare Other | Source: Ambulatory Visit | Attending: Neurosurgery | Admitting: Neurosurgery

## 2017-08-06 ENCOUNTER — Encounter (HOSPITAL_COMMUNITY): Payer: Self-pay

## 2017-08-06 ENCOUNTER — Other Ambulatory Visit: Payer: Self-pay

## 2017-08-06 DIAGNOSIS — M4316 Spondylolisthesis, lumbar region: Secondary | ICD-10-CM | POA: Diagnosis not present

## 2017-08-06 DIAGNOSIS — Z01818 Encounter for other preprocedural examination: Secondary | ICD-10-CM | POA: Diagnosis present

## 2017-08-06 HISTORY — DX: Personal history of urinary calculi: Z87.442

## 2017-08-06 HISTORY — DX: Chronic kidney disease, unspecified: N18.9

## 2017-08-06 LAB — BASIC METABOLIC PANEL
Anion gap: 10 (ref 5–15)
BUN: 30 mg/dL — AB (ref 6–20)
CALCIUM: 9.9 mg/dL (ref 8.9–10.3)
CHLORIDE: 110 mmol/L (ref 101–111)
CO2: 22 mmol/L (ref 22–32)
CREATININE: 1.84 mg/dL — AB (ref 0.44–1.00)
GFR calc Af Amer: 30 mL/min — ABNORMAL LOW (ref >60)
GFR calc non Af Amer: 26 mL/min — ABNORMAL LOW (ref >60)
GLUCOSE: 152 mg/dL — AB (ref 65–99)
Potassium: 4.3 mmol/L (ref 3.5–5.1)
Sodium: 142 mmol/L (ref 135–145)

## 2017-08-06 LAB — TYPE AND SCREEN
ABO/RH(D): O NEG
ANTIBODY SCREEN: NEGATIVE

## 2017-08-06 LAB — CBC
HCT: 40.1 % (ref 36.0–46.0)
Hemoglobin: 13.1 g/dL (ref 12.0–15.0)
MCH: 31.4 pg (ref 26.0–34.0)
MCHC: 32.7 g/dL (ref 30.0–36.0)
MCV: 96.2 fL (ref 78.0–100.0)
PLATELETS: 174 10*3/uL (ref 150–400)
RBC: 4.17 MIL/uL (ref 3.87–5.11)
RDW: 13 % (ref 11.5–15.5)
WBC: 7.3 10*3/uL (ref 4.0–10.5)

## 2017-08-06 LAB — ABO/RH: ABO/RH(D): O NEG

## 2017-08-06 LAB — SURGICAL PCR SCREEN
MRSA, PCR: NEGATIVE
Staphylococcus aureus: NEGATIVE

## 2017-08-06 NOTE — Progress Notes (Addendum)
KKX:[id number] Jonni Sanger, MD  Cardiologist: pt denies  EKG: denies past year, obtained today  Stress test: 04/01/16 in Epic  ECHO:  Pt denies  Cardiac Cath: pt denies   Chest x-ray: pt denies, no recent respiratory infections/complications

## 2017-08-07 NOTE — Progress Notes (Signed)
Anesthesia Chart Review:  Pt is a 77 year old female scheduled for L4-5, L5-S1 PLIF, interbody arthrodesis, posterior segmental instrumentation on 08/13/2017 with Consuella Lose, MD  - PCP is Billey Chang, MD  PMH includes:  HTN, CKD (stage III), asthma, GERD. Never smoker. BMI 35  Medications include: Albuterol, ASA 81 mg, Breo Ellipta, metoprolol, Prilosec, simvastatin  BP (!) 139/50   Pulse 77   Temp 36.8 C   Resp 20   Ht 5\' 3"  (1.6 m)   Wt 197 lb 6.4 oz (89.5 kg)   SpO2 96%   BMI 34.97 kg/m   Preoperative labs reviewed.   - Cr 1.84, BUN 30.  This is consistent with prior results in Epic and care everywhere  EKG 08/06/17:  - NSR. Minimal voltage criteria for LVH, may be normal variant. Possible Anterior infarct, age undetermined. Nonspecific T wave abnormality - Appears stable when compared to EKG 03/06/16  Nuclear stress test 04/01/16:  - Normal stress nuclear study with no ischemia or infarction; study not gated; frequent PVCs.  Echo 03/13/16 [facility] [place]): 1.  LV normal in size, wall thickness, and wall motion with a EF 60-65%.  Grade I mild diastolic dysfunction 2.  LA mildly dilated. 3.  RA mildly dilated. 4.  Aortic valve trileaflet.  Mild aortic sclerosis is present with good valvular opening.  There is mild aortic regurgitation present.  If no changes, I anticipate pt can proceed with surgery as scheduled.   Willeen Cass, FNP-BC [facility]/Anesthesiology Phone: [phone] 08/07/2017 3:24 PM

## 2017-08-13 ENCOUNTER — Inpatient Hospital Stay (HOSPITAL_COMMUNITY): Payer: Medicare Other | Admitting: Emergency Medicine

## 2017-08-13 ENCOUNTER — Encounter (HOSPITAL_COMMUNITY): Payer: Self-pay | Admitting: Anesthesiology

## 2017-08-13 ENCOUNTER — Inpatient Hospital Stay (HOSPITAL_COMMUNITY)
Admission: RE | Disposition: A | Discharge status: Home / Self Care | Payer: Self-pay | Source: Ambulatory Visit | Attending: Neurosurgery

## 2017-08-13 ENCOUNTER — Inpatient Hospital Stay (HOSPITAL_COMMUNITY): Payer: Medicare Other | Admitting: Anesthesiology

## 2017-08-13 ENCOUNTER — Inpatient Hospital Stay (HOSPITAL_COMMUNITY)
Admission: RE | Admit: 2017-08-13 | Discharge: 2017-08-14 | DRG: 460 | Disposition: A | Discharge status: Home / Self Care | Payer: Medicare Other | Source: Ambulatory Visit | Attending: Neurosurgery | Admitting: Neurosurgery

## 2017-08-13 ENCOUNTER — Inpatient Hospital Stay (HOSPITAL_COMMUNITY): Payer: Medicare Other

## 2017-08-13 DIAGNOSIS — M4317 Spondylolisthesis, lumbosacral region: Secondary | ICD-10-CM | POA: Diagnosis present

## 2017-08-13 DIAGNOSIS — J4520 Mild intermittent asthma, uncomplicated: Secondary | ICD-10-CM | POA: Diagnosis present

## 2017-08-13 DIAGNOSIS — K219 Gastro-esophageal reflux disease without esophagitis: Secondary | ICD-10-CM | POA: Diagnosis present

## 2017-08-13 DIAGNOSIS — Z419 Encounter for procedure for purposes other than remedying health state, unspecified: Secondary | ICD-10-CM

## 2017-08-13 DIAGNOSIS — Z7951 Long term (current) use of inhaled steroids: Secondary | ICD-10-CM | POA: Diagnosis not present

## 2017-08-13 DIAGNOSIS — Z7982 Long term (current) use of aspirin: Secondary | ICD-10-CM | POA: Diagnosis not present

## 2017-08-13 DIAGNOSIS — M48062 Spinal stenosis, lumbar region with neurogenic claudication: Principal | ICD-10-CM | POA: Diagnosis present

## 2017-08-13 DIAGNOSIS — E669 Obesity, unspecified: Secondary | ICD-10-CM | POA: Diagnosis present

## 2017-08-13 DIAGNOSIS — Z90710 Acquired absence of both cervix and uterus: Secondary | ICD-10-CM | POA: Diagnosis not present

## 2017-08-13 DIAGNOSIS — N183 Chronic kidney disease, stage 3 (moderate): Secondary | ICD-10-CM | POA: Diagnosis present

## 2017-08-13 DIAGNOSIS — M4807 Spinal stenosis, lumbosacral region: Secondary | ICD-10-CM | POA: Diagnosis present

## 2017-08-13 DIAGNOSIS — Z6834 Body mass index (BMI) 34.0-34.9, adult: Secondary | ICD-10-CM

## 2017-08-13 DIAGNOSIS — I129 Hypertensive chronic kidney disease with stage 1 through stage 4 chronic kidney disease, or unspecified chronic kidney disease: Secondary | ICD-10-CM | POA: Diagnosis present

## 2017-08-13 DIAGNOSIS — M4316 Spondylolisthesis, lumbar region: Secondary | ICD-10-CM | POA: Diagnosis present

## 2017-08-13 DIAGNOSIS — M545 Low back pain: Secondary | ICD-10-CM | POA: Diagnosis present

## 2017-08-13 LAB — GLUCOSE, CAPILLARY: Glucose-Capillary: 136 mg/dL — ABNORMAL HIGH (ref 65–99)

## 2017-08-13 SURGERY — POSTERIOR LUMBAR FUSION 2 LEVEL
Anesthesia: General | Site: Spine Lumbar

## 2017-08-13 MED ORDER — MONTELUKAST SODIUM 10 MG PO TABS
10.0000 mg | ORAL_TABLET | Freq: Every day | ORAL | Status: DC
Start: 2017-08-13 — End: 2017-08-14
  Filled 2017-08-13: qty 1

## 2017-08-13 MED ORDER — METHOCARBAMOL 1000 MG/10ML IJ SOLN: 500.0000 mg | Freq: Four times a day (QID) | INTRAMUSCULAR | Status: DC | PRN

## 2017-08-13 MED ORDER — VANCOMYCIN HCL IN DEXTROSE 1-5 GM/200ML-% IV SOLN
INTRAVENOUS | Status: AC
  Filled 2017-08-13: qty 200

## 2017-08-13 MED ORDER — DEXAMETHASONE SODIUM PHOSPHATE 10 MG/ML IJ SOLN
INTRAMUSCULAR | Status: DC | PRN
  Administered 2017-08-13: 10 mg via INTRAVENOUS

## 2017-08-13 MED ORDER — ROCURONIUM BROMIDE 100 MG/10ML IV SOLN
INTRAVENOUS | Status: DC | PRN
  Administered 2017-08-13: 50 mg via INTRAVENOUS

## 2017-08-13 MED ORDER — PROPOFOL 10 MG/ML IV BOLUS
INTRAVENOUS | Status: DC | PRN
  Administered 2017-08-13: 140 mg via INTRAVENOUS

## 2017-08-13 MED ORDER — OXYCODONE HCL 5 MG/5ML PO SOLN: 5.0000 mg | Freq: Once | ORAL | Status: DC | PRN

## 2017-08-13 MED ORDER — CHLORHEXIDINE GLUCONATE CLOTH 2 % EX PADS: 6.0000 | MEDICATED_PAD | Freq: Once | CUTANEOUS | Status: DC

## 2017-08-13 MED ORDER — VANCOMYCIN HCL IN DEXTROSE 1-5 GM/200ML-% IV SOLN: 1000.0000 mg | INTRAVENOUS | Status: DC

## 2017-08-13 MED ORDER — SODIUM CHLORIDE 0.9% FLUSH: 3.0000 mL | Freq: Two times a day (BID) | INTRAVENOUS | Status: DC

## 2017-08-13 MED ORDER — OXYCODONE HCL 5 MG PO TABS
ORAL_TABLET | ORAL | Status: AC
  Filled 2017-08-13: qty 2

## 2017-08-13 MED ORDER — DOCUSATE SODIUM 100 MG PO CAPS
100.0000 mg | ORAL_CAPSULE | Freq: Two times a day (BID) | ORAL | Status: DC
  Administered 2017-08-13 – 2017-08-14 (×2): 100 mg via ORAL
  Filled 2017-08-13 (×2): qty 1

## 2017-08-13 MED ORDER — GLYCOPYRROLATE 0.2 MG/ML IJ SOLN
INTRAMUSCULAR | Status: DC | PRN
  Administered 2017-08-13: 0.2 mg via INTRAVENOUS
  Administered 2017-08-13: .6 mg via INTRAVENOUS

## 2017-08-13 MED ORDER — METOPROLOL SUCCINATE ER 100 MG PO TB24: 100.0000 mg | ORAL_TABLET | Freq: Every day | ORAL | Status: DC

## 2017-08-13 MED ORDER — BUPIVACAINE HCL (PF) 0.5 % IJ SOLN
INTRAMUSCULAR | Status: DC | PRN
  Administered 2017-08-13: 5 mL

## 2017-08-13 MED ORDER — SODIUM CHLORIDE 0.9% FLUSH: 3.0000 mL | INTRAVENOUS | Status: DC | PRN

## 2017-08-13 MED ORDER — THROMBIN (RECOMBINANT) 5000 UNITS EX SOLR
CUTANEOUS | Status: AC
  Filled 2017-08-13: qty 5000

## 2017-08-13 MED ORDER — MENTHOL 3 MG MT LOZG: 1.0000 | LOZENGE | OROMUCOSAL | Status: DC | PRN

## 2017-08-13 MED ORDER — LIDOCAINE-EPINEPHRINE 1 %-1:100000 IJ SOLN
INTRAMUSCULAR | Status: DC | PRN
  Administered 2017-08-13: 5 mL

## 2017-08-13 MED ORDER — METHOCARBAMOL 500 MG PO TABS
500.0000 mg | ORAL_TABLET | Freq: Four times a day (QID) | ORAL | Status: DC | PRN
  Administered 2017-08-13 – 2017-08-14 (×3): 500 mg via ORAL
  Filled 2017-08-13 (×2): qty 1

## 2017-08-13 MED ORDER — VITAMIN D 1000 UNITS PO TABS
2000.0000 [IU] | ORAL_TABLET | Freq: Every day | ORAL | Status: DC
  Administered 2017-08-13 – 2017-08-14 (×2): 2000 [IU] via ORAL
  Filled 2017-08-13 (×2): qty 2

## 2017-08-13 MED ORDER — THROMBIN (RECOMBINANT) 20000 UNITS EX SOLR
CUTANEOUS | Status: DC | PRN
  Administered 2017-08-13: 20 mL via TOPICAL

## 2017-08-13 MED ORDER — PHENYLEPHRINE HCL 10 MG/ML IJ SOLN
INTRAMUSCULAR | Status: DC | PRN
  Administered 2017-08-13: 100 ug via INTRAVENOUS

## 2017-08-13 MED ORDER — LACTATED RINGERS IV SOLN
INTRAVENOUS | Status: DC | PRN
  Administered 2017-08-13 (×2): via INTRAVENOUS

## 2017-08-13 MED ORDER — FLUTICASONE FUROATE-VILANTEROL 200-25 MCG/INH IN AEPB
1.0000 | INHALATION_SPRAY | Freq: Every day | RESPIRATORY_TRACT | Status: DC
  Filled 2017-08-13: qty 28

## 2017-08-13 MED ORDER — MORPHINE SULFATE (PF) 4 MG/ML IV SOLN
2.0000 mg | INTRAVENOUS | Status: DC | PRN
  Administered 2017-08-13: 2 mg via INTRAVENOUS
  Filled 2017-08-13: qty 1

## 2017-08-13 MED ORDER — OXYCODONE HCL 5 MG PO TABS
10.0000 mg | ORAL_TABLET | ORAL | Status: DC | PRN
  Administered 2017-08-13 – 2017-08-14 (×4): 10 mg via ORAL
  Filled 2017-08-13 (×3): qty 2

## 2017-08-13 MED ORDER — FENTANYL CITRATE (PF) 100 MCG/2ML IJ SOLN
INTRAMUSCULAR | Status: DC | PRN
  Administered 2017-08-13 (×3): 50 ug via INTRAVENOUS
  Administered 2017-08-13: 100 ug via INTRAVENOUS

## 2017-08-13 MED ORDER — ONDANSETRON HCL 4 MG PO TABS: 4.0000 mg | ORAL_TABLET | Freq: Four times a day (QID) | ORAL | Status: DC | PRN

## 2017-08-13 MED ORDER — ONDANSETRON HCL 4 MG/2ML IJ SOLN: 4.0000 mg | Freq: Four times a day (QID) | INTRAMUSCULAR | Status: DC | PRN

## 2017-08-13 MED ORDER — FENTANYL CITRATE (PF) 100 MCG/2ML IJ SOLN
INTRAMUSCULAR | Status: AC
  Filled 2017-08-13: qty 2

## 2017-08-13 MED ORDER — ADULT MULTIVITAMIN W/MINERALS CH
1.0000 | ORAL_TABLET | Freq: Every day | ORAL | Status: DC
  Administered 2017-08-13: 1 via ORAL
  Filled 2017-08-13 (×3): qty 1

## 2017-08-13 MED ORDER — OXYCODONE HCL 5 MG PO TABS: 5.0000 mg | ORAL_TABLET | ORAL | Status: DC | PRN

## 2017-08-13 MED ORDER — LIDOCAINE-EPINEPHRINE 1 %-1:100000 IJ SOLN
INTRAMUSCULAR | Status: AC
  Filled 2017-08-13: qty 1

## 2017-08-13 MED ORDER — ARTIFICIAL TEARS OPHTHALMIC OINT: TOPICAL_OINTMENT | OPHTHALMIC | Status: DC | PRN

## 2017-08-13 MED ORDER — SODIUM CHLORIDE 0.9 % IV SOLN: INTRAVENOUS | Status: DC

## 2017-08-13 MED ORDER — ALBUMIN HUMAN 5 % IV SOLN
INTRAVENOUS | Status: DC | PRN
  Administered 2017-08-13 (×2): via INTRAVENOUS

## 2017-08-13 MED ORDER — ACETAMINOPHEN 325 MG PO TABS
650.0000 mg | ORAL_TABLET | ORAL | Status: DC | PRN
  Administered 2017-08-14: 650 mg via ORAL
  Filled 2017-08-13: qty 2

## 2017-08-13 MED ORDER — PHENYLEPHRINE HCL 10 MG/ML IJ SOLN
INTRAVENOUS | Status: DC | PRN
  Administered 2017-08-13: 50 ug/min via INTRAVENOUS

## 2017-08-13 MED ORDER — PHENYLEPHRINE HCL 10 MG/ML IJ SOLN: INTRAMUSCULAR | Status: DC | PRN

## 2017-08-13 MED ORDER — METHOCARBAMOL 500 MG PO TABS
ORAL_TABLET | ORAL | Status: AC
  Filled 2017-08-13: qty 1

## 2017-08-13 MED ORDER — PANTOPRAZOLE SODIUM 40 MG PO TBEC
40.0000 mg | DELAYED_RELEASE_TABLET | Freq: Every day | ORAL | Status: DC
  Administered 2017-08-14: 40 mg via ORAL
  Filled 2017-08-13 (×2): qty 1

## 2017-08-13 MED ORDER — BUPIVACAINE HCL (PF) 0.5 % IJ SOLN
INTRAMUSCULAR | Status: AC
  Filled 2017-08-13: qty 30

## 2017-08-13 MED ORDER — PROPOFOL 10 MG/ML IV BOLUS
INTRAVENOUS | Status: AC
Start: 2017-08-13 — End: ?
  Filled 2017-08-13: qty 20

## 2017-08-13 MED ORDER — FENTANYL CITRATE (PF) 100 MCG/2ML IJ SOLN
25.0000 ug | INTRAMUSCULAR | Status: DC | PRN
  Administered 2017-08-13 (×2): 25 ug via INTRAVENOUS
  Administered 2017-08-13 (×2): 50 ug via INTRAVENOUS

## 2017-08-13 MED ORDER — LIDOCAINE HCL (CARDIAC) 20 MG/ML IV SOLN
INTRAVENOUS | Status: DC | PRN
  Administered 2017-08-13: 30 mg via INTRAVENOUS

## 2017-08-13 MED ORDER — 0.9 % SODIUM CHLORIDE (POUR BTL) OPTIME
TOPICAL | Status: DC | PRN
  Administered 2017-08-13: 1000 mL

## 2017-08-13 MED ORDER — OXYCODONE HCL 5 MG PO TABS: 5.0000 mg | ORAL_TABLET | Freq: Once | ORAL | Status: DC | PRN

## 2017-08-13 MED ORDER — PHENOL 1.4 % MT LIQD: 1.0000 | OROMUCOSAL | Status: DC | PRN

## 2017-08-13 MED ORDER — LORATADINE 10 MG PO TABS
10.0000 mg | ORAL_TABLET | Freq: Every day | ORAL | Status: DC
  Administered 2017-08-13 – 2017-08-14 (×2): 10 mg via ORAL
  Filled 2017-08-13 (×2): qty 1

## 2017-08-13 MED ORDER — NEOSTIGMINE METHYLSULFATE 10 MG/10ML IV SOLN
INTRAVENOUS | Status: DC | PRN
  Administered 2017-08-13: 3 mg via INTRAVENOUS

## 2017-08-13 MED ORDER — BISACODYL 10 MG RE SUPP: 10.0000 mg | Freq: Every day | RECTAL | Status: DC | PRN

## 2017-08-13 MED ORDER — GELATIN ABSORBABLE MT POWD
OROMUCOSAL | Status: DC | PRN
  Administered 2017-08-13 (×2): 5 mL

## 2017-08-13 MED ORDER — CEFAZOLIN SODIUM-DEXTROSE 2-4 GM/100ML-% IV SOLN
2.0000 g | Freq: Three times a day (TID) | INTRAVENOUS | Status: AC
  Administered 2017-08-13 – 2017-08-14 (×2): 2 g via INTRAVENOUS
  Filled 2017-08-13 (×2): qty 100

## 2017-08-13 MED ORDER — LACTATED RINGERS IV SOLN
INTRAVENOUS | Status: DC
  Administered 2017-08-13 (×2): via INTRAVENOUS

## 2017-08-13 MED ORDER — THROMBIN 5000 UNITS EX SOLR
CUTANEOUS | Status: AC
  Filled 2017-08-13: qty 5000

## 2017-08-13 MED ORDER — VANCOMYCIN HCL 1000 MG IV SOLR
INTRAVENOUS | Status: AC
  Administered 2017-08-13: 12:00:00 via INTRAVENOUS
  Filled 2017-08-13 (×2): qty 100

## 2017-08-13 MED ORDER — ALBUTEROL SULFATE (2.5 MG/3ML) 0.083% IN NEBU: 3.0000 mL | INHALATION_SOLUTION | Freq: Four times a day (QID) | RESPIRATORY_TRACT | Status: DC | PRN

## 2017-08-13 MED ORDER — BACITRACIN 50000 UNITS IM SOLR
INTRAMUSCULAR | Status: DC | PRN
  Administered 2017-08-13: 500 mL

## 2017-08-13 MED ORDER — GLUCOSAMINE-CHONDROITIN 500-400 MG PO CAPS: ORAL_CAPSULE | Freq: Every day | ORAL | Status: DC

## 2017-08-13 MED ORDER — ACETAMINOPHEN 650 MG RE SUPP: 650.0000 mg | RECTAL | Status: DC | PRN

## 2017-08-13 MED ORDER — FENTANYL CITRATE (PF) 250 MCG/5ML IJ SOLN
INTRAMUSCULAR | Status: AC
  Filled 2017-08-13: qty 5

## 2017-08-13 MED ORDER — ONDANSETRON HCL 4 MG/2ML IJ SOLN
INTRAMUSCULAR | Status: DC | PRN
  Administered 2017-08-13: 4 mg via INTRAVENOUS

## 2017-08-13 MED ORDER — ONDANSETRON HCL 4 MG/2ML IJ SOLN: 4.0000 mg | Freq: Once | INTRAMUSCULAR | Status: DC | PRN

## 2017-08-13 MED ORDER — THROMBIN 20000 UNITS EX SOLR
CUTANEOUS | Status: AC
  Filled 2017-08-13: qty 20000

## 2017-08-13 MED ORDER — SENNA 8.6 MG PO TABS
1.0000 | ORAL_TABLET | Freq: Two times a day (BID) | ORAL | Status: DC
  Administered 2017-08-13 – 2017-08-14 (×2): 8.6 mg via ORAL
  Filled 2017-08-13 (×2): qty 1

## 2017-08-13 MED ORDER — SODIUM CHLORIDE 0.9 % IV SOLN: 250.0000 mL | INTRAVENOUS | Status: DC

## 2017-08-13 SURGICAL SUPPLY — 82 items
BAG DECANTER FOR FLEXI CONT (MISCELLANEOUS) ×2 IMPLANT
BASKET BONE COLLECTION (BASKET) ×2 IMPLANT
BENZOIN TINCTURE PRP APPL 2/3 (GAUZE/BANDAGES/DRESSINGS) IMPLANT
BIT DRILL 3.5 POWEREASE (BIT) ×2 IMPLANT
BLADE CLIPPER SURG (BLADE) IMPLANT
BLADE SURG 11 STRL SS (BLADE) ×2 IMPLANT
BUR MATCHSTICK NEURO 3.0 LAGG (BURR) ×2 IMPLANT
BUR PRECISION FLUTE 5.0 (BURR) ×4 IMPLANT
CANISTER SUCT 3000ML PPV (MISCELLANEOUS) ×2 IMPLANT
CARTRIDGE OIL MAESTRO DRILL (MISCELLANEOUS) ×1 IMPLANT
CONT SPEC 4OZ CLIKSEAL STRL BL (MISCELLANEOUS) ×2 IMPLANT
COVER BACK TABLE 60X90IN (DRAPES) ×2 IMPLANT
DECANTER SPIKE VIAL GLASS SM (MISCELLANEOUS) ×2 IMPLANT
DERMABOND ADVANCED (GAUZE/BANDAGES/DRESSINGS) ×2
DERMABOND ADVANCED .7 DNX12 (GAUZE/BANDAGES/DRESSINGS) ×2 IMPLANT
DEVICE INTERBODY ELEVATE 23X7 (Cage) ×4 IMPLANT
DEVICE INTERBODY ELEVATE 23X8 (Cage) ×2 IMPLANT
DIFFUSER DRILL AIR PNEUMATIC (MISCELLANEOUS) ×2 IMPLANT
DRAPE C-ARM 42X72 X-RAY (DRAPES) ×2 IMPLANT
DRAPE C-ARMOR (DRAPES) ×2 IMPLANT
DRAPE LAPAROTOMY 100X72X124 (DRAPES) ×2 IMPLANT
DRAPE POUCH INSTRU U-SHP 10X18 (DRAPES) ×2 IMPLANT
DRAPE SURG 17X23 STRL (DRAPES) ×2 IMPLANT
DRSG OPSITE POSTOP 4X6 (GAUZE/BANDAGES/DRESSINGS) ×2 IMPLANT
DURAPREP 26ML APPLICATOR (WOUND CARE) ×2 IMPLANT
ELECT REM PT RETURN 9FT ADLT (ELECTROSURGICAL) ×2
ELECTRODE REM PT RTRN 9FT ADLT (ELECTROSURGICAL) ×1 IMPLANT
GAUZE SPONGE 4X4 12PLY STRL (GAUZE/BANDAGES/DRESSINGS) IMPLANT
GAUZE SPONGE 4X4 16PLY XRAY LF (GAUZE/BANDAGES/DRESSINGS) IMPLANT
GLOVE BIO SURGEON STRL SZ7.5 (GLOVE) ×4 IMPLANT
GLOVE BIO SURGEON STRL SZ8 (GLOVE) ×2 IMPLANT
GLOVE BIOGEL PI IND STRL 6.5 (GLOVE) ×4 IMPLANT
GLOVE BIOGEL PI IND STRL 7.5 (GLOVE) ×4 IMPLANT
GLOVE BIOGEL PI IND STRL 8.5 (GLOVE) ×1 IMPLANT
GLOVE BIOGEL PI INDICATOR 6.5 (GLOVE) ×4
GLOVE BIOGEL PI INDICATOR 7.5 (GLOVE) ×4
GLOVE BIOGEL PI INDICATOR 8.5 (GLOVE) ×1
GLOVE ECLIPSE 7.0 STRL STRAW (GLOVE) ×6 IMPLANT
GLOVE EXAM NITRILE LRG STRL (GLOVE) IMPLANT
GLOVE EXAM NITRILE XL STR (GLOVE) IMPLANT
GLOVE EXAM NITRILE XS STR PU (GLOVE) IMPLANT
GLOVE SURG SS PI 6.0 STRL IVOR (GLOVE) ×8 IMPLANT
GOWN STRL REUS W/ TWL LRG LVL3 (GOWN DISPOSABLE) ×4 IMPLANT
GOWN STRL REUS W/ TWL XL LVL3 (GOWN DISPOSABLE) IMPLANT
GOWN STRL REUS W/TWL 2XL LVL3 (GOWN DISPOSABLE) IMPLANT
GOWN STRL REUS W/TWL LRG LVL3 (GOWN DISPOSABLE) ×4
GOWN STRL REUS W/TWL XL LVL3 (GOWN DISPOSABLE)
HEMOSTAT POWDER KIT SURGIFOAM (HEMOSTASIS) ×4 IMPLANT
KIT BASIN OR (CUSTOM PROCEDURE TRAY) ×2 IMPLANT
KIT INFUSE X SMALL 1.4CC (Orthopedic Implant) ×2 IMPLANT
KIT POSITION SURG JACKSON T1 (MISCELLANEOUS) ×2 IMPLANT
KIT ROOM TURNOVER OR (KITS) ×2 IMPLANT
MILL MEDIUM DISP (BLADE) ×2 IMPLANT
NEEDLE HYPO 18GX1.5 BLUNT FILL (NEEDLE) IMPLANT
NEEDLE HYPO 22GX1.5 SAFETY (NEEDLE) ×2 IMPLANT
NEEDLE SPNL 18GX3.5 QUINCKE PK (NEEDLE) ×2 IMPLANT
NS IRRIG 1000ML POUR BTL (IV SOLUTION) ×2 IMPLANT
OIL CARTRIDGE MAESTRO DRILL (MISCELLANEOUS) ×2
PACK LAMINECTOMY NEURO (CUSTOM PROCEDURE TRAY) ×2 IMPLANT
PAD ARMBOARD 7.5X6 YLW CONV (MISCELLANEOUS) ×6 IMPLANT
PASTE BONE GRAFTON 5CC (Bone Implant) ×2 IMPLANT
PATTIES SURGICAL 1X1 (DISPOSABLE) ×2 IMPLANT
ROD SOLERA 55MM (Rod) ×4 IMPLANT
SCREW 5.5X30MM (Screw) ×4 IMPLANT
SCREW 5.5X35MM (Screw) ×4 IMPLANT
SCREW BN 35X5.5XMA NS SPNE (Screw) ×4 IMPLANT
SCREW SET SOLERA (Screw) ×6 IMPLANT
SCREW SET SOLERA TI (Screw) ×6 IMPLANT
SPACER SPNL XLORDOTIC 23X8X (Cage) ×2 IMPLANT
SPCR SPNL XLORDOTIC 23X8X (Cage) ×2 IMPLANT
SPONGE LAP 4X18 X RAY DECT (DISPOSABLE) IMPLANT
SPONGE SURGIFOAM ABS GEL 100 (HEMOSTASIS) ×2 IMPLANT
STRIP CLOSURE SKIN 1/2X4 (GAUZE/BANDAGES/DRESSINGS) IMPLANT
SUT VIC AB 0 CT1 18XCR BRD8 (SUTURE) ×2 IMPLANT
SUT VIC AB 0 CT1 8-18 (SUTURE) ×2
SUT VIC AB 3-0 FS2 27 (SUTURE) IMPLANT
SUT VICRYL 3-0 RB1 18 ABS (SUTURE) ×4 IMPLANT
SYR 3ML LL SCALE MARK (SYRINGE) ×2 IMPLANT
TOWEL GREEN STERILE (TOWEL DISPOSABLE) ×2 IMPLANT
TOWEL GREEN STERILE FF (TOWEL DISPOSABLE) ×2 IMPLANT
TRAY FOLEY W/METER SILVER 16FR (SET/KITS/TRAYS/PACK) ×2 IMPLANT
WATER STERILE IRR 1000ML POUR (IV SOLUTION) ×2 IMPLANT

## 2017-08-13 NOTE — Anesthesia Preprocedure Evaluation (Signed)
Anesthesia Evaluation  Patient identified by MRN, date of birth, ID band Patient awake    Reviewed: Allergy & Precautions, NPO status , Patient's Chart, lab work & pertinent test results  Airway Mallampati: II  TM Distance: >3 FB Neck ROM: Full    Dental  (+) Teeth Intact, Dental Advisory Given   Pulmonary    breath sounds clear to auscultation       Cardiovascular hypertension,  Rhythm:Regular Rate:Normal     Neuro/Psych    GI/Hepatic   Endo/Other    Renal/GU      Musculoskeletal   Abdominal (+) + obese,   Peds  Hematology   Anesthesia Other Findings   Reproductive/Obstetrics                             Anesthesia Physical Anesthesia Plan  ASA: III  Anesthesia Plan: General   Post-op Pain Management:    Induction: Intravenous  PONV Risk Score and Plan: Ondansetron and Dexamethasone  Airway Management Planned: Oral ETT  Additional Equipment:   Intra-op Plan:   Post-operative Plan:   Informed Consent: I have reviewed the patients History and Physical, chart, labs and discussed the procedure including the risks, benefits and alternatives for the proposed anesthesia with the patient or authorized representative who has indicated his/her understanding and acceptance.   Dental advisory given  Plan Discussed with: CRNA and Anesthesiologist  Anesthesia Plan Comments:         Anesthesia Quick Evaluation

## 2017-08-13 NOTE — Transfer of Care (Signed)
Immediate Anesthesia Transfer of Care Note  Patient: Sabrina Ramirez  Procedure(s) Performed: POSTERIOR LUMBAR INTERBODY FUSION LUMBAR FOUR- LUMBAR FIVE, LUMBAR FIVE- SACRAL ONE, INTERBODY ARTHRODESIS, POSTERIOR SEGMENTAL INSTRUMENTATION (N/A Spine Lumbar)  Patient Location: PACU  Anesthesia Type:General  Level of Consciousness: awake, alert  and oriented  Airway & Oxygen Therapy: Patient Spontanous Breathing and Patient connected to nasal cannula oxygen  Post-op Assessment: Report given to RN and Post -op Vital signs reviewed and stable  Post vital signs: Reviewed and stable  Last Vitals:  Vitals:   08/13/17 1056 08/13/17 1825  BP: (!) 147/71 115/60  Pulse: 64 (!) 54  Resp: 20 15  Temp: (!) 36.4 C 36.5 C  SpO2: 98% 97%    Last Pain:  Vitals:   08/13/17 1123  TempSrc:   PainSc: 8       Patients Stated Pain Goal: 1 (98/92/11 9417)  Complications: No apparent anesthesia complications

## 2017-08-13 NOTE — H&P (Signed)
Chief Complaint   Back pain  HPI   HPI: Sabrina Ramirez is a 77 y.o. female with long standing history of back and leg pain.  She says she has had some chronic back pain for quite some time, but symptoms have progressively worsened over the past few years.  She describes pain in her lower back almost like a pressure, worsened when she stands or walks for any length of time.  Over the last several months she has also been describing progressively worsening soreness in her legs whenever she walks.  She says when she is at the grocery store she has to be hunched over a shopping cart to get some relief.  She has not really noted any numbness or tingling in her legs, nor any changes in bowel or bladder function.  She has been through a course of physical therapy but this has not provided any real relief.  She has also been on some muscle relaxers but these have not helped.  She has a history of chronic kidney disease and is unable to tolerate NSAIDs. An MRI of her lumbar spine was ordered and significant for L4-5 and L5-S1grade 1 anterolisthesis, with significant bilateral facet arthropathy.  At L4-5 there is moderate central and lateral recess stenosis, while at L5-S1 there is moderate to severe central and bilateral lateral recess stenosis. It was discussed that she either continue to treat conservatively with injection therapy, or she undergo surgery. She elected to undergo surgery and presents today for the procedure. She is without any concerns.  Patient Active Problem List   Diagnosis Date Noted  . Colon polyp 05/25/2017  . Ophthalmic migraine 05/25/2017  . Essential tremor 05/25/2017  . Allergic asthma, mild intermittent, uncomplicated 61/60/7371  . Obesity, Class I, BMI 30-34.9 04/16/2015  . Conjunctivitis, allergic, chronic 10/01/2012  . Hypertriglyceridemia 09/22/2011  . Rosacea 06/25/2011  . Irritable bowel syndrome with diarrhea 07/08/2010  . Benign essential hypertension 05/23/2010  .  CKD (chronic kidney disease) stage 3, GFR 30-59 ml/min (HCC) 05/23/2010  . Chronic allergic rhinitis 02/25/2010  . GERD (gastroesophageal reflux disease) 02/25/2010  . Psoriasis 02/25/2010  . Osteoarthritis, multiple sites 06/12/2009    PMH: Past Medical History:  Diagnosis Date  . Allergic asthma, mild intermittent, uncomplicated 12/03/9483   Dr. Donneta Romberg, allergist  . Arthritis   . Chronic kidney disease    Stage III kidney disease  . GERD (gastroesophageal reflux disease)   . Hemorrhoids   . History of kidney stones   . Hypertension   . IBS (irritable bowel syndrome)   . Psoriasis     PSH: Past Surgical History:  Procedure Laterality Date  . ABDOMINAL HYSTERECTOMY  1987  . CATARACT EXTRACTION W/ INTRAOCULAR LENS  IMPLANT, BILATERAL Bilateral 2015  . CHOLECYSTECTOMY    . HEMORRHOID SURGERY    . TONSILLECTOMY      Medications Prior to Admission  Medication Sig Dispense Refill Last Dose  . Albuterol Sulfate (PROAIR RESPICLICK) 462 (90 Base) MCG/ACT AEPB Inhale 1-2 puffs into the lungs every 6 (six) hours as needed (for wheezing/shortness of breath).    Taking  . aspirin EC 81 MG tablet Take 81 mg by mouth at bedtime.    Taking  . azelastine (ASTELIN) 0.1 % nasal spray Place 1 spray into the nose daily as needed (for allergies.).    Taking  . cetirizine (ZYRTEC) 10 MG tablet Take 10 mg by mouth daily.   Taking  . Cholecalciferol (VITAMIN D3) 2000 units TABS Take 2,000  Units by mouth daily.     Marland Kitchen CINNAMON PO Take 8.11 applicators by mouth daily. 1/4 teaspoon in coffee grounds   Taking  . fluocinonide cream (LIDEX) 0.05 % APPLY SPARINGLY  TO AFFECTED AREA THREE TIMES DAILY for psoriasis flares. Do not use longer than 2 weeks.   Taking  . fluticasone furoate-vilanterol (BREO ELLIPTA) 200-25 MCG/INH AEPB Inhale 1 puff into the lungs daily.     . Glucosamine-Chondroitin (COSAMIN DS PO) Take 2 tablets by mouth daily.     . metoprolol succinate (TOPROL-XL) 100 MG 24 hr tablet Take  100 mg by mouth daily.   Taking  . montelukast (SINGULAIR) 10 MG tablet Take 10 mg by mouth at bedtime.    Taking  . Multiple Vitamin (MULTIVITAMIN) tablet Take 1 tablet by mouth daily. Senior Multivitamin   Taking  . nystatin cream (MYCOSTATIN) Apply 1 application topically 2 (two) times daily as needed (for skin irritation.). FOR FUNGAL INFECTIONS UNDER BREAST OR IN GROIN AREA   Taking  . omeprazole (PRILOSEC) 20 MG capsule Take 20 mg by mouth daily.   Taking  . triamcinolone cream (KENALOG) 0.1 % Apply 1 application topically 2 (two) times daily as needed (for skin rash (Summertime)).    Taking  . TURMERIC PO Take 5.72 applicators by mouth daily. 0.25 teaspoonful directly into coffee   Taking  . simvastatin (ZOCOR) 20 MG tablet Take 1 tablet (20 mg total) by mouth at bedtime. (Patient not taking: Reported on 07/28/2017) 90 tablet 3 Not Taking at Unknown time    SH: Social History   Tobacco Use  . Smoking status: Never Smoker  . Smokeless tobacco: Never Used  Substance Use Topics  . Alcohol use: Yes    Comment: occassionally  . Drug use: No    MEDS: Prior to Admission medications   Medication Sig Start Date End Date Taking? Authorizing Provider  Albuterol Sulfate (PROAIR RESPICLICK) 620 (90 Base) MCG/ACT AEPB Inhale 1-2 puffs into the lungs every 6 (six) hours as needed (for wheezing/shortness of breath).  06/04/16  Yes [provider]  aspirin EC 81 MG tablet Take 81 mg by mouth at bedtime.    Yes [provider]  azelastine (ASTELIN) 0.1 % nasal spray Place 1 spray into the nose daily as needed (for allergies.).  02/05/17  Yes [provider]  cetirizine (ZYRTEC) 10 MG tablet Take 10 mg by mouth daily.   Yes [provider]  Cholecalciferol (VITAMIN D3) 2000 units TABS Take 2,000 Units by mouth daily.   Yes [provider]  CINNAMON PO Take 3.55 applicators by mouth daily. 1/4 teaspoon in coffee grounds   Yes [provider]   fluocinonide cream (LIDEX) 0.05 % APPLY SPARINGLY  TO AFFECTED AREA THREE TIMES DAILY for psoriasis flares. Do not use longer than 2 weeks. 12/31/16  Yes [provider]  fluticasone furoate-vilanterol (BREO ELLIPTA) 200-25 MCG/INH AEPB Inhale 1 puff into the lungs daily.   Yes [provider]  Glucosamine-Chondroitin (COSAMIN DS PO) Take 2 tablets by mouth daily.   Yes [provider]  metoprolol succinate (TOPROL-XL) 100 MG 24 hr tablet Take 100 mg by mouth daily. 08/14/16  Yes [provider]  montelukast (SINGULAIR) 10 MG tablet Take 10 mg by mouth at bedtime.  08/14/16  Yes [provider]  Multiple Vitamin (MULTIVITAMIN) tablet Take 1 tablet by mouth daily. Senior Multivitamin   Yes [provider]  nystatin cream (MYCOSTATIN) Apply 1 application topically 2 (two) times daily  as needed (for skin irritation.). FOR FUNGAL INFECTIONS UNDER BREAST OR IN GROIN AREA   Yes [provider]  omeprazole (PRILOSEC) 20 MG capsule Take 20 mg by mouth daily.   Yes [provider]  triamcinolone cream (KENALOG) 0.1 % Apply 1 application topically 2 (two) times daily as needed (for skin rash (Summertime)).    Yes [provider]  TURMERIC PO Take 7.67 applicators by mouth daily. 0.25 teaspoonful directly into coffee   Yes [provider]  simvastatin (ZOCOR) 20 MG tablet Take 1 tablet (20 mg total) by mouth at bedtime. Patient not taking: Reported on 07/28/2017 05/25/17   Leamon Arnt, MD    ALLERGY: Allergies  Allergen Reactions  . Corn-Containing Products Other (See Comments)    PLEASE BE ADVISED HIGH ALERT FOR GLUCOSE DRIPS/POPSICLES/JELLO FOR SWEETENERS WITH CORN   HEADACHES/RUNNY NOSE/GI UPSET/LARGE INTESTINE GROWTH/STOMACH CRAMPING    . Amlodipine Swelling  . Ace Inhibitors Cough  . Adhesive [Tape] Rash    PAPER TAPE OK  . Angiotensin Receptor Blockers Cough  . Iodine Rash  . Triamterene-Hctz Cough     Social History   Tobacco Use  . Smoking status: Never Smoker  . Smokeless tobacco: Never Used  Substance Use Topics  . Alcohol use: Yes    Comment: occassionally     Family History  Problem Relation Age of Onset  . Stroke Mother   . Diabetes Father   . Heart attack Father   . Heart disease Father   . Hyperlipidemia Father   . Hypertension Father   . Stroke Father   . Diabetes Sister   . Hypertension Sister   . Transient ischemic attack Sister   . COPD Maternal Grandmother   . Heart disease Maternal Grandmother   . Deep vein thrombosis Maternal Grandfather   . Stroke Maternal Grandfather   . Deep vein thrombosis Paternal Grandmother   . Hypertension Paternal Grandmother   . Hypertension Daughter   . Arthritis Maternal Aunt   . COPD Maternal Aunt   . Hypertension Maternal Aunt   . Diabetes Paternal Uncle   . Heart disease Paternal Uncle   . Stroke Paternal Uncle      ROS   Review of Systems  Constitutional: Negative.   HENT: Negative.   Eyes: Negative.   Respiratory: Negative.   Cardiovascular: Negative.   Gastrointestinal: Negative.   Genitourinary: Negative.   Musculoskeletal: Positive for back pain and myalgias. Negative for neck pain.  Skin: Negative.   Neurological: Positive for tingling. Negative for dizziness, tremors, sensory change, speech change, focal weakness, seizures, loss of consciousness and headaches.    Exam   There were no vitals filed for this visit. General appearance: WDWN, NAD Eyes: PERRL, Fundoscopic: normal Cardiovascular: Regular rate and rhythm without murmurs, rubs, gallops. No edema or variciosities. Distal pulses normal. Pulmonary: Clear to auscultation Musculoskeletal:     Muscle tone upper extremities: Normal    Muscle tone lower extremities: Normal    Motor exam: Upper Extremities Deltoid Bicep Tricep Grip  Right 5/5 5/5 5/5 5/5  Left 5/5 5/5 5/5 5/5   Lower Extremity IP Quad PF DF EHL  Right 5/5 5/5 5/5 5/5 5/5   Left 5/5 5/5 5/5 5/5 5/5   Neurological Awake, alert, oriented Memory and concentration grossly intact Speech fluent, appropriate CNII: Visual fields normal CNIII/IV/VI: EOMI CNV: Facial sensation normal CNVII: Symmetric, normal strength CNVIII: Grossly normal CNIX: Normal palate movement CNXI: Trap and SCM strength normal CN XII: Tongue protrusion  normal Sensation grossly intact to LT DTR: Normal Coordination (finger/nose & heel/shin): Normal  Results - Imaging/Labs   No results found for this or any previous visit (from the past 48 hour(s)).  No results found.  Impression/Plan   77 y.o. female with back and leg pain consistent with neurogenic claudication related to moderate to severe spinal stenosis of multifactorial origin with spondylolisthesis at L4-5 and L5-S1.  She has had progression of symptoms despite reasonable conservative treatment including medication and a course of physical therapy. It was recommended she under L4-5 and L5-S1 PLIF. After discussing the details of the procedure in the office, as well as the risks which include but are not limited to nerve root injury leading to leg or foot weakness/numbness and/or bowel and bladder dysfunction, CSF leak, bleeding, and infection.  Possible outcomes of surgery were also discussed including the possibility of persistence or worsening of pain symptoms and the possibility of accelerated adjacent level degeneration. The general risks of anesthesia were also reviewed including heart attack, stroke, and DVT/PE she elected to proceed with surgery. All questions were answered. She wishes to proceed.

## 2017-08-13 NOTE — Anesthesia Postprocedure Evaluation (Signed)
Anesthesia Post Note  Patient: Sabrina Ramirez  Procedure(s) Performed: POSTERIOR LUMBAR INTERBODY FUSION LUMBAR FOUR- LUMBAR FIVE, LUMBAR FIVE- SACRAL ONE, INTERBODY ARTHRODESIS, POSTERIOR SEGMENTAL INSTRUMENTATION (N/A Spine Lumbar)     Patient location during evaluation: PACU Anesthesia Type: General Level of consciousness: awake and alert Pain management: pain level controlled Vital Signs Assessment: post-procedure vital signs reviewed and stable Respiratory status: spontaneous breathing, nonlabored ventilation, respiratory function stable and patient connected to nasal cannula oxygen Cardiovascular status: blood pressure returned to baseline and stable Postop Assessment: no apparent nausea or vomiting Anesthetic complications: no    Last Vitals:  Vitals:   08/13/17 1056 08/13/17 1825  BP: (!) 147/71 115/60  Pulse: 64 (!) 54  Resp: 20 15  Temp: (!) 36.4 C 36.5 C  SpO2: 98% 97%    Last Pain:  Vitals:   08/13/17 1123  TempSrc:   PainSc: 8                  Betsi Crespi COKER

## 2017-08-13 NOTE — Progress Notes (Addendum)
Checked with pharmacy regarding contraindication for Vancomycin.  Pharmacy states that it is OKAY to give Vancomycin, Vancomycin must be mixed in Normal Saline.

## 2017-08-13 NOTE — Anesthesia Procedure Notes (Signed)
Procedure Name: Intubation Date/Time: 08/13/2017 1:31 PM Performed by: Eligha Bridegroom, CRNA Pre-anesthesia Checklist: Patient identified, Emergency Drugs available, Suction available, Patient being monitored and Timeout performed Patient Re-evaluated:Patient Re-evaluated prior to induction Oxygen Delivery Method: Circle system utilized Preoxygenation: Pre-oxygenation with 100% oxygen Induction Type: IV induction Ventilation: Mask ventilation without difficulty and Oral airway inserted - appropriate to patient size Laryngoscope Size: Mac, 3 and Glidescope Grade View: Grade III Tube type: Oral Tube size: 6.5 mm Airway Equipment and Method: Video-laryngoscopy and Stylet Placement Confirmation: ETT inserted through vocal cords under direct vision,  positive ETCO2 and breath sounds checked- equal and bilateral Secured at: 21 cm Tube secured with: Tape Dental Injury: Teeth and Oropharynx as per pre-operative assessment  Difficulty Due To: Difficult Airway- due to anterior larynx

## 2017-08-14 LAB — CBC
HCT: 29.4 % — ABNORMAL LOW (ref 36.0–46.0)
Hemoglobin: 9.7 g/dL — ABNORMAL LOW (ref 12.0–15.0)
MCH: 31.3 pg (ref 26.0–34.0)
MCHC: 33 g/dL (ref 30.0–36.0)
MCV: 94.8 fL (ref 78.0–100.0)
Platelets: 134 10*3/uL — ABNORMAL LOW (ref 150–400)
RBC: 3.1 MIL/uL — ABNORMAL LOW (ref 3.87–5.11)
RDW: 12.8 % (ref 11.5–15.5)
WBC: 13.6 10*3/uL — ABNORMAL HIGH (ref 4.0–10.5)

## 2017-08-14 LAB — BASIC METABOLIC PANEL
Anion gap: 12 (ref 5–15)
BUN: 30 mg/dL — ABNORMAL HIGH (ref 6–20)
CO2: 24 mmol/L (ref 22–32)
Calcium: 8.4 mg/dL — ABNORMAL LOW (ref 8.9–10.3)
Chloride: 104 mmol/L (ref 101–111)
Creatinine, Ser: 1.82 mg/dL — ABNORMAL HIGH (ref 0.44–1.00)
GFR calc Af Amer: 30 mL/min — ABNORMAL LOW (ref >60)
GFR calc non Af Amer: 26 mL/min — ABNORMAL LOW (ref >60)
GLUCOSE: 164 mg/dL — AB (ref 65–99)
POTASSIUM: 4.4 mmol/L (ref 3.5–5.1)
Sodium: 140 mmol/L (ref 135–145)

## 2017-08-14 MED ORDER — METHOCARBAMOL 500 MG PO TABS: 500.0000 mg | ORAL_TABLET | Freq: Four times a day (QID) | ORAL | 0 refills | Status: DC | PRN

## 2017-08-14 MED ORDER — OXYCODONE-ACETAMINOPHEN 5-325 MG PO TABS: 1.0000 | ORAL_TABLET | ORAL | 0 refills | Status: DC | PRN

## 2017-08-14 MED ORDER — OXYCODONE-ACETAMINOPHEN 5-325 MG PO TABS
1.0000 | ORAL_TABLET | ORAL | Status: DC | PRN
  Administered 2017-08-14: 2 via ORAL
  Filled 2017-08-14: qty 2

## 2017-08-14 MED ORDER — ASPIRIN EC 81 MG PO TBEC: 81.0000 mg | DELAYED_RELEASE_TABLET | Freq: Every day | ORAL | Status: AC

## 2017-08-14 MED FILL — Thrombin For Soln 20000 Unit: CUTANEOUS | Qty: 1 | Status: AC

## 2017-08-14 MED FILL — Thrombin (Recombinant) For Soln 5000 Unit: CUTANEOUS | Qty: 5000 | Status: AC

## 2017-08-14 MED FILL — Thrombin For Soln 5000 Unit: CUTANEOUS | Qty: 5000 | Status: AC

## 2017-08-14 NOTE — Progress Notes (Signed)
Occupational Therapy Treatment Patient Details Name: Sabrina Ramirez MRN: [id number] DOB: December 10, 1940 Today's Date: 08/14/2017    History of present illness Patient is a 77 y/o female admitted for lumbar decompression/fusion at L4-5 and L5-S1.    OT comments  All education is complete and patient indicates understanding. Pt will have toilet tongs prior to d/c and friend leaving room to purchase. Pt will have family purchase command hooks to use with hand held shower and toilet tongs.   Follow Up Recommendations  No OT follow up    Equipment Recommendations  3 in 1 bedside commode;Other (comment)    Recommendations for Other Services      Precautions / Restrictions Precautions Precautions: Back Precaution Booklet Issued: Yes (comment) Precaution Comments: pending brace arrival  Required Braces or Orthoses: (pending arrival)       Mobility Bed Mobility Overal bed mobility: Modified Independent             General bed mobility comments: cues for positioning to adhere to precautions but able to perform without (A)  Transfers Overall transfer level: Needs assistance Equipment used: Rolling walker (2 wheeled) Transfers: Sit to/from Stand Sit to Stand: Supervision         General transfer comment: cue for hand placement with RW    Balance Overall balance assessment: Needs assistance   Sitting balance-Leahy Scale: Good       Standing balance-Leahy Scale: Fair                             ADL either performed or assessed with clinical judgement   ADL                 Lower Body Dressing: Supervision/safety Lower Body Dressing Details (indicate cue type and reason): able to cross bil LE and advised to dress L LE first due to pain/ weakness  Toilet Transfer: Min guard   Toileting- Clothing Manipulation and Hygiene: Min guard Toileting - Clothing Manipulation Details (indicate cue type and reason): educated on hygiene and toilet tongs. friend to  purchase toilet tongs from gift shop prior to d/c  Functional mobility during ADLs: Supervision/safety General ADL Comments: pt completed chair to bed transfer supervision     Vision Baseline Vision/History: Wears glasses Wears Glasses: At all times     Perception     Praxis      Cognition Arousal/Alertness: Awake/alert Behavior During Therapy: [facility] for tasks assessed/performed Overall Cognitive Status: Within Functional Limits for tasks assessed                                          Exercises     Shoulder Instructions       General Comments dressing changed during evaluation by RN. Dressing saturated again this session    Pertinent Vitals/ Pain       Pain Assessment: 0-10 Pain Score: 5  Pain Location: back and hips Pain Descriptors / Indicators: Aching;Operative site guarding Pain Intervention(s): Monitored during session;Premedicated before session;Repositioned  Home Living Family/patient expects to be discharged to:: Private residence Living Arrangements: Children;Non-relatives/Friends Available Help at Discharge: Family;Friend(s);Available 24 hours/[place] Type of Home: House Home Access: Stairs to enter [employer] of Steps: (1&1) Entrance Stairs-Rails: None Home Layout: One level     Bathroom Shower/Tub: Occupational psychologist: Standard     Home Equipment:  Walker - 2 wheels;Shower seat;Adaptive equipment(borrowed DME) Adaptive Equipment: Reacher Additional Comments: borrowed walker from friend       Prior Functioning/Environment Level of Independence: Independent            Frequency  Min 2X/week        Progress Toward Goals  OT Goals(current goals can now be found in the care plan section)  Progress towards OT goals: Goals met/education completed, patient discharged from OT  Acute Rehab OT Goals Patient Stated Goal: to be able to toilet independently OT Goal Formulation: With patient/family Time  For Goal Achievement: 08/21/17 Potential to Achieve Goals: Good ADL Goals Pt Will Perform Lower Body Dressing: with supervision;sit to/from stand Pt Will Transfer to Toilet: with supervision;ambulating;bedside commode(with AE)  Plan Discharge plan remains appropriate    Co-evaluation                 AM-PAC PT "6 Clicks" Daily Activity     Outcome Measure   Help from another person eating meals?: None Help from another person taking care of personal grooming?: None Help from another person toileting, which includes using toliet, bedpan, or urinal?: None Help from another person bathing (including washing, rinsing, drying)?: None Help from another person to put on and taking off regular upper body clothing?: None Help from another person to put on and taking off regular lower body clothing?: None 6 Click Score: 24    End of Session Equipment Utilized During Treatment: Rolling walker  OT Visit Diagnosis: Muscle weakness (generalized) (M62.81)   Activity Tolerance Patient tolerated treatment well   Patient Left in bed;with call bell/phone within reach;with family/visitor present   Nurse Communication Mobility status;Precautions        Time: 1000-1026 OT Time Calculation (min): 26 min  Charges: OT General Charges $OT Visit: 1 Visit OT Evaluation $OT Eval Moderate Complexity: 1 Mod OT Treatments $Self Care/Home Management : 23-37 mins   Jeri Modena   OTR/L Pager: [phone] Office: [phone] .    Parke Poisson B 08/14/2017, 10:49 AM

## 2017-08-14 NOTE — Evaluation (Signed)
Physical Therapy Evaluation Patient Details Name: Sabrina Ramirez MRN: [id number] DOB: [birth year] Today's Date: 08/14/2017   History of Present Illness  Patient is a 77 y/o female admitted for lumbar decompression/fusion at L4-5 and L5-S1.   Clinical Impression  Patient presents with decreased mobility after back surgery due to pain and limited knowledge of precautions.  She seems she will progress without follow up PT at this time with enough available assistance at home.  Discussed if she does not progress as expected to contact MD for referral for HHPT vs outpatient PT.  Will sign off as pt for d/c home this pm.     Follow Up Recommendations No PT follow up    Equipment Recommendations  3in1 (PT);Rolling walker with 5" wheels    Recommendations for Other Services       Precautions / Restrictions Precautions Precautions: Back Precaution Booklet Issued: Yes (comment) Precaution Comments: able to state 3/3 precautions following OT session; reviewed precautions during session      Mobility  Bed Mobility               General bed mobility comments: up in bathroom with OT upon arrival  Transfers Overall transfer level: Needs assistance   Transfers: Sit to/from Stand Sit to Stand: Supervision         General transfer comment: cues for technique to sit on recliner in room  Ambulation/Gait Ambulation/Gait assistance: Supervision;Min guard Ambulation Distance (Feet): 100 Feet Assistive device: Rolling walker (2 wheeled) Gait Pattern/deviations: Step-through pattern;Decreased stride length     General Gait Details: staying in walker safely without assist, slow pace  Stairs Stairs: Yes Stairs assistance: Min assist Stair Management: No rails;Forwards;With walker;Backwards Number of Stairs: 2 General stair comments: practiced with HHA forwards and with walker backwards, but later reports does not have two steps together, but 1 step and a landing and one  more  Wheelchair Mobility    Modified Rankin (Stroke Patients Only)       Balance Overall balance assessment: Needs assistance   Sitting balance-Leahy Scale: Good       Standing balance-Leahy Scale: Fair                               Pertinent Vitals/Pain Pain Assessment: 0-10 Pain Score: 5  Pain Location: back and hips Pain Descriptors / Indicators: Aching;Operative site guarding Pain Intervention(s): Repositioned;Monitored during session    [place] expects to be discharged to:: Private residence Living Arrangements: Non-relatives/Friends Available Help at Discharge: Family;Friend(s);Available 24 hours/[place] Type of Home: House Home Access: Stairs to enter Entrance Stairs-Rails: None Entrance Stairs-Number of Steps: 1&1 Home Layout: One level Home Equipment: None Additional Comments: borrowed a walker    Prior Function Level of Independence: Independent               Hand Dominance        Extremity/Trunk Assessment   Upper Extremity Assessment Upper Extremity Assessment: Defer to OT evaluation    Lower Extremity Assessment Lower Extremity Assessment: Generalized weakness    Cervical / Trunk Assessment Cervical / Trunk Assessment: Other exceptions Cervical / Trunk Exceptions: s/p fusion  Communication   Communication: No difficulties  Cognition Arousal/Alertness: Awake/alert Behavior During Therapy: WFL for tasks assessed/performed Overall Cognitive Status: Within Functional Limits for tasks assessed  General Comments General comments (skin integrity, edema, etc.): friend in room and equipment delivered prior to end of session (RW & 3:1)    Exercises     Assessment/Plan    PT Assessment Patent does not need any further PT services  PT Problem List         PT Treatment Interventions      PT Goals (Current goals can be found in the Care Plan section)   Acute Rehab PT Goals PT Goal Formulation: All assessment and education complete, DC therapy    Frequency     Barriers to discharge        Co-evaluation               AM-PAC PT "6 Clicks" Daily Activity  Outcome Measure Difficulty turning over in bed (including adjusting bedclothes, sheets and blankets)?: A Lot Difficulty moving from lying on back to sitting on the side of the bed? : A Lot Difficulty sitting down on and standing up from a chair with arms (e.g., wheelchair, bedside commode, etc,.)?: A Little Help needed moving to and from a bed to chair (including a wheelchair)?: A Little Help needed walking in hospital room?: A Little Help needed climbing 3-5 steps with a railing? : A Little 6 Click Score: 16    End of Session Equipment Utilized During Treatment: Gait belt Activity Tolerance: Patient tolerated treatment well Patient left: in chair;with call bell/phone within reach;with family/visitor present        Time: 4888-9169 PT Time Calculation (min) (ACUTE ONLY): 17 min   Charges:   PT Evaluation $PT Eval Low Complexity: 1 Low     PT G CodesMagda Kiel, [place] [phone] 08/14/2017   Reginia Naas 08/14/2017, 10:33 AM

## 2017-08-14 NOTE — Progress Notes (Signed)
Patient alert and oriented, mae's well, voiding adequate amount of urine, swallowing without difficulty, no c/o pain at time of discharge. Patient discharged home with family. Script and discharged instructions given to patient. Patient and family stated understanding of instructions given. Patient has an appointment with Dr. 

## 2017-08-14 NOTE — Evaluation (Signed)
Occupational Therapy Evaluation Patient Details Name: Sabrina Ramirez MRN: [id number] DOB: 1940-07-29 Today's Date: 08/14/2017    History of Present Illness Patient is a 77 y/o female admitted for lumbar decompression/fusion at L4-5 and L5-S1.    Clinical Impression   Patient is s/p L4-5 L5-S1 surgery resulting in functional limitations due to the deficits listed below (see OT problem list). Pt was independent prior to admission and wants to return to all task since she lives alone. A friend and daughter will (A) upon d/c PRN. Pt currently requires (A) for toileting.  Patient will benefit from skilled OT acutely to increase independence and safety with ADLS to allow discharge home with 3n1 and RW.     Follow Up Recommendations  No OT follow up    Equipment Recommendations  3 in 1 bedside commode;Other (comment)(rw)    Recommendations for Other Services       Precautions / Restrictions Precautions Precautions: Back Precaution Booklet Issued: Yes (comment) Precaution Comments: handout provided and reviewed for adls with back precautions. pt able to recall precautions at the end of the session Required Braces or Orthoses: (pending arrival)      Mobility Bed Mobility Overal bed mobility: Modified Independent             General bed mobility comments: cues for positioning to adhere to precautions but able to perform without (A)  Transfers Overall transfer level: Needs assistance Equipment used: Rolling walker (2 wheeled) Transfers: Sit to/from Stand Sit to Stand: Supervision         General transfer comment: cue for hand placement with RW    Balance Overall balance assessment: Needs assistance   Sitting balance-Leahy Scale: Good       Standing balance-Leahy Scale: Fair                             ADL either performed or assessed with clinical judgement   ADL Overall ADL's : Needs assistance/impaired Eating/Feeding: Independent   Grooming:  Wash/dry hands;Wash/dry face;Oral care;Supervision/safety                  Toileting- Clothing Manipulation and Hygiene: Min guard Toileting - Clothing Manipulation Details (indicate cue type and reason): educated on hygiene and toilet tongs. friend to purchase toilet tongs from gift shop prior to d/c  Tub/ Shower Transfer: English as a second language teacher Details (indicate cue type and reason): demonstrates with backing up to shower and using RW for hand placement Functional mobility during ADLs: Supervision/safety General ADL Comments: pt educated on bed transfer, height of walker, height of 3n1, use of step stool for bed transfer, shower transfer, chair positioning, adls with reaching and crossing bil le.   Back handout provided and reviewed adls in detail. Pt educated on: clothing between brace, never sleep in brace, set an alarm at night for medication, avoid sitting for long periods of time, correct bed positioning for sleeping, correct sequence for bed mobility, avoiding lifting more than 5 pounds and never wash directly over incision. A second session for LB to follow     Vision Baseline Vision/History: Wears glasses Wears Glasses: At all times       Perception     Praxis      Pertinent Vitals/Pain Pain Assessment: 0-10 Pain Score: 5  Pain Location: back and hips Pain Descriptors / Indicators: Aching;Operative site guarding Pain Intervention(s): Monitored during session;Premedicated before session;Repositioned     Hand Dominance Right   Extremity/Trunk Assessment  Upper Extremity Assessment Upper Extremity Assessment: Generalized weakness   Lower Extremity Assessment Lower Extremity Assessment: Defer to PT evaluation   Cervical / Trunk Assessment Cervical / Trunk Assessment: Other exceptions Cervical / Trunk Exceptions: s/p surg   Communication Communication Communication: No difficulties   Cognition Arousal/Alertness: Awake/alert Behavior During  Therapy: WFL for tasks assessed/performed Overall Cognitive Status: Within Functional Limits for tasks assessed                                     General Comments  friend in room and equipment delivered prior to end of session (RW & 3:1)    Exercises     Shoulder Instructions      Home Living Family/patient expects to be discharged to:: Private residence Living Arrangements: Children;Non-relatives/Friends Available Help at Discharge: Family;Friend(s);Available 24 hours/[place] Type of Home: House Home Access: Stairs to enter [employer] of Steps: (1&1) Entrance Stairs-Rails: None Home Layout: One level     Bathroom Shower/Tub: Occupational psychologist: Standard     Home Equipment: Environmental consultant - 2 wheels;Shower seat;Adaptive equipment(borrowed DME) Adaptive Equipment: Reacher Additional Comments: borrowed walker from friend       Prior Functioning/Environment Level of Independence: Independent                 OT Problem List: Decreased strength;Decreased activity tolerance;Impaired balance (sitting and/or standing);Obesity;Pain      OT Treatment/Interventions: Self-care/ADL training;Therapeutic exercise;DME and/or AE instruction;Therapeutic activities;Balance training;Patient/family education    OT Goals(Current goals can be found in the care plan section) Acute Rehab OT Goals Patient Stated Goal: to be able to toilet independently OT Goal Formulation: With patient/family Time For Goal Achievement: 08/21/17 Potential to Achieve Goals: Good  OT Frequency: Min 2X/week   Barriers to D/C:            Co-evaluation              AM-PAC PT "6 Clicks" Daily Activity     Outcome Measure Help from another person eating meals?: None Help from another person taking care of personal grooming?: A Little Help from another person toileting, which includes using toliet, bedpan, or urinal?: A Little Help from another person bathing  (including washing, rinsing, drying)?: A Little Help from another person to put on and taking off regular upper body clothing?: A Little Help from another person to put on and taking off regular lower body clothing?: A Little 6 Click Score: 19   End of Session Equipment Utilized During Treatment: Rolling walker Nurse Communication: Mobility status;Precautions  Activity Tolerance: Patient tolerated treatment well Patient left: Other (comment)(with PT)  OT Visit Diagnosis: Muscle weakness (generalized) (M62.81)                Time: 4401-0272 OT Time Calculation (min): 47 min Charges:  OT General Charges $OT Visit: 1 Visit OT Evaluation $OT Eval Moderate Complexity: 1 Mod OT Treatments $Self Care/Home Management : 23-37 mins G-Codes:      Jeri Modena   OTR/L Pager: [phone] Office: [phone] .   Parke Poisson B 08/14/2017, 10:43 AM

## 2017-08-14 NOTE — Discharge Summary (Signed)
Physician Discharge Summary  Patient ID: Sabrina Ramirez MRN: [id number] DOB/AGE: 1941-04-11 77 y.o.  Admit date: 08/13/2017 Discharge date: 08/14/2017  Admission Diagnoses:  Spondylolisthesis, L4-5, L5-S1 Spinal stenosis  Discharge Diagnoses:  Same Active Problems:   Spondylolisthesis at L5-S1 level   Discharged Condition: Stable  Hospital Course:  Sabrina Ramirez is a 77 y.o. female electively admitted after lumbar decompression/fusion at L4-5 and L5-S1. She was at neurologic baseline postop, ambulating with assistive device, tolerating diet, voiding normally with pain under control with oral medication.  Treatments: Surgery - PLIF L4-5, L5-S1  Discharge Exam: Blood pressure 113/61, pulse 68, temperature 98.9 F (37.2 C), temperature source Oral, resp. rate 18, height 5\' 3"  (1.6 m), weight 89.5 kg (197 lb 6.4 oz), SpO2 95 %. Awake, alert, oriented Speech fluent, appropriate CN grossly intact 5/5 BUE/BLE Wound c/d/i  Disposition: 01-Home or Self Care  Discharge Instructions    Call MD for:  redness, tenderness, or signs of infection (pain, swelling, redness, odor or green/yellow discharge around incision site)   Complete by:  As directed    Call MD for:  temperature >100.4   Complete by:  As directed    Diet - low sodium heart healthy   Complete by:  As directed    Discharge instructions   Complete by:  As directed    Walk at home as much as possible, at least 4 times / [place]   Increase activity slowly   Complete by:  As directed    Lifting restrictions   Complete by:  As directed    No lifting > 10 lbs   May shower / Bathe   Complete by:  As directed    48 hours after surgery   May walk up steps   Complete by:  As directed    No dressing needed   Complete by:  As directed    Other Restrictions   Complete by:  As directed    No bending/twisting at waist   Remove dressing in 24 hours   Complete by:  As directed      Allergies as of 08/14/2017      Reactions    Corn-containing Products Other (See Comments)   PLEASE BE ADVISED HIGH ALERT FOR GLUCOSE DRIPS/POPSICLES/JELLO FOR SWEETENERS WITH CORN   HEADACHES/RUNNY NOSE/GI UPSET/LARGE INTESTINE GROWTH/STOMACH CRAMPING   Amlodipine Swelling   Ace Inhibitors Cough   Adhesive [tape] Rash   PAPER TAPE OK   Angiotensin Receptor Blockers Cough   Iodine Rash   Triamterene-hctz Cough      Medication List    STOP taking these medications   TURMERIC PO     TAKE these medications   aspirin EC 81 MG tablet Take 1 tablet (81 mg total) by mouth at bedtime. Start taking on:  08/20/2017 What changed:  These instructions start on 08/20/2017. If you are unsure what to do until then, ask your doctor or other care provider.   azelastine 0.1 % nasal spray Commonly known as:  ASTELIN Place 1 spray into the nose daily as needed (for allergies.).   BREO ELLIPTA 200-25 MCG/INH Aepb Generic drug:  fluticasone furoate-vilanterol Inhale 1 puff into the lungs daily.   cetirizine 10 MG tablet Commonly known as:  ZYRTEC Take 10 mg by mouth daily.   CINNAMON PO Take 4.74 applicators by mouth daily. 1/4 teaspoon in coffee grounds   COSAMIN DS PO Take 2 tablets by mouth daily.   fluocinonide cream 0.05 % Commonly known as:  LIDEX  APPLY SPARINGLY  TO AFFECTED AREA THREE TIMES DAILY for psoriasis flares. Do not use longer than 2 weeks.   methocarbamol 500 MG tablet Commonly known as:  ROBAXIN Take 1 tablet (500 mg total) by mouth every 6 (six) hours as needed for muscle spasms.   metoprolol succinate 100 MG 24 hr tablet Commonly known as:  TOPROL-XL Take 100 mg by mouth daily.   montelukast 10 MG tablet Commonly known as:  SINGULAIR Take 10 mg by mouth at bedtime.   multivitamin tablet Take 1 tablet by mouth daily. Senior Multivitamin   nystatin cream Commonly known as:  MYCOSTATIN Apply 1 application topically 2 (two) times daily as needed (for skin irritation.). FOR FUNGAL INFECTIONS UNDER  BREAST OR IN GROIN AREA   omeprazole 20 MG capsule Commonly known as:  PRILOSEC Take 20 mg by mouth daily.   oxyCODONE-acetaminophen 5-325 MG tablet Commonly known as:  PERCOCET/ROXICET Take 1-2 tablets by mouth every 4 (four) hours as needed for severe pain.   PROAIR RESPICLICK 353 (90 Base) MCG/ACT Aepb Generic drug:  Albuterol Sulfate Inhale 1-2 puffs into the lungs every 6 (six) hours as needed (for wheezing/shortness of breath).   simvastatin 20 MG tablet Commonly known as:  ZOCOR Take 1 tablet (20 mg total) by mouth at bedtime.   triamcinolone cream 0.1 % Commonly known as:  KENALOG Apply 1 application topically 2 (two) times daily as needed (for skin rash (Summertime)).   Vitamin D3 2000 units Tabs Take 2,000 Units by mouth daily.      Follow-up Information    Consuella Lose, MD Follow up in 3 week(s).   Specialty:  Neurosurgery Contact information: [address] [place] [address] [phone]           Signed: Jairo Ben 08/14/2017, 9:35 AM

## 2017-08-14 NOTE — Op Note (Signed)
PREOP DIAGNOSIS:  1. Lumbar spinal stenosis, spondylolisthesis L4-5, L5-S1  POSTOP DIAGNOSIS: Same  PROCEDURE: 1. L5, S1 laminectomy with facetectomy for decompression of exiting nerve roots, more than would be required for placement of interbody graft 2. Placement of anterior interbody device - Medtronic expandable cage, 60mm x 2 @ L5-S1, 60mm x 2 @ L4-5 3. Posterior segmental instrumentation using cortical pedicle screws at L4-S1 4. Interbody arthrodesis, L4-5, L5-S1 5. Use of locally harvested bone autograft 6. Use of non-structural bone allograft - rhBMP-2  SURGEON: Dr. Consuella Lose, MD  ASSISTANT: Dr. Erline Levine, MD  ANESTHESIA: General Endotracheal  EBL: 600cc  SPECIMENS: None  DRAINS: None  COMPLICATIONS: None immediate  CONDITION: Hemodynamically stable to PACU  HISTORY: Sabrina Ramirez is a 77 y.o. female who has been seen in the outpatient clinic with back and leg pain related to multifactorial stenosis with spondylolisthesis at L4-5 and L5-S1. She attempted multiple conservative treatments and ultimately elected to proceed with surgical decompression and fusion. Risks and benefits were reviewed and consent was obtained.  PROCEDURE IN DETAIL: After informed consent was obtained and witnessed, the patient was brought to the operating room. After induction of general anesthesia, the patient was positioned on the operative table in the prone position. All pressure points were meticulously padded. Incision was then marked out and prepped and draped in the usual sterile fashion.  After timeout was conducted, skin was infiltrated with local anesthetic. Skin incision was then made sharply and Bovie electrocautery was used to dissect the subcutaneous tissue until the lumbodorsal fascia was identified and incised. The muscle was then elevated in the subperiosteal plane and the L4, L5, and superior S1 lamina as well as the L4-5 and L5-S1 facet complexes were identified.  Self-retaining retractors were then placed. Levels were confirmed with intraoperative fluoroscopy.  At this point attention was turned to decompression. Complete L4 and L5 laminectomy with facetectomy was completed using a combination of Kerrison rongeurs and a high-speed drill. The exiting L4 and L5 roots as well as the S1 roots were identified. Good decompression of the exiting and traversing roots was confirmed.  Disc spaces at both L4-5 and L5-S1 were then identified, incised, and using a combination of shavers, curettes and rongeurs, complete discectomy was completed. Endplates were prepared with rongeurs. 23mm expandable cages were tapped into place at L5-S1. 31mm expandable cages were tapped into place at L4-5.  Bone harvested during decompression was mixed with extender and BMP and packed into the interspace prior to cage insertion. Good position was confirmed with fluoroscopy.  At this point, the L4, L5, and S1 cortical pedicle screws were drilled and tapped to 5.5 mm. The pilot holes were probed with a sound and no breaches were identified. Screws were then placed in L4, L5, and S1. 36mm rod was then placed, set screws placed and final tightened. Final AP and lateral fluoroscopic images confirmed good position.  The wound was then irrigated with copious amounts of antibiotic saline, then closed in standard fashion using a combination of interrupted 0 and 3-0 Vicryl stitches in the muscular, fascial, and subcutaneous layers. Skin was then closed using standard Dermabond. Sterile dressing was then applied. The patient was then transferred to the stretcher, extubated, and taken to the postanesthesia care unit in stable hemodynamic condition.  At the end of the case all sponge, needle, cottonoid, and instrument counts were correct.

## 2017-08-20 ENCOUNTER — Telehealth: Payer: Self-pay | Admitting: Emergency Medicine

## 2017-08-20 NOTE — Telephone Encounter (Signed)
Copied from [place]. Topic: Appointment Scheduling - Scheduling Inquiry for Clinic >> Aug 20, 2017  1:54 PM Antonieta Iba C wrote: Reason for CRM: pt has an apt scheduled for Monday 08/24/17. Pt says that she just had back surgery and is not quite ambulatory yet. Pt would like to reschedule but would like to be sure that she will not have trouble with receiving refills if she reschedule. Left apt scheduled until pt has been advised.    Please advise.    CB: [phone]     Appt canceled. Patient will call back to reschedule.   Doloris Hall,  LPN

## 2017-08-21 ENCOUNTER — Telehealth: Payer: Self-pay | Admitting: Family Medicine

## 2017-08-21 NOTE — Telephone Encounter (Signed)
Spoke with Daughter, Advised okay to Take medication with tylenol. I discussed side effects of flexeril with daughter and advised daughter to call neuro surgeon about possible complications with patients behavior and possible thoughts of moving into a rehab facility.    AP, LPN

## 2017-08-21 NOTE — Telephone Encounter (Signed)
Copied from [place] [id number]. Topic: Quick Communication - See Telephone Encounter >> Aug 21, 2017  9:37 AM Aurelio Brash B wrote: CRM for notification. See Telephone encounter for:    PT was prescribed  oxyCODONE-acetaminophen (PERCOCET/ROXICET) 5-325 MG tablet  for pain after surgery  by Dr Kathyrn Sheriff. Family just found out Dr Jonni Sanger did not want pt taking   tylenol due to kidneys.  Shes been taking the meds for aprox a week-   Family would like to talk to Dr Jonni Sanger concerning this -  Daughter Mignon contact number is [phone]   08/21/17.

## 2017-08-21 NOTE — Telephone Encounter (Signed)
I spoke with patients daughter this morning, She states that her mother is still in a good amount of pain from surgery. Neuro Surgeon prescribed Oxy with Acetaminophen. Per daughter she states that Dr. Jonni Sanger did not want her taking tylenol containing products due to her kidneys. Patients daughter states that she has a prescription for Ultram is it okay to switch to that?  Patients daughter also reports, patient is taking Flexeril and is causing the patient to be extremely loopy to the point she cannot make a sentence.   Please advise.    Doloris Hall,  LPN

## 2017-08-24 ENCOUNTER — Ambulatory Visit: Payer: Medicare Other | Admitting: Family Medicine

## 2017-10-28 ENCOUNTER — Ambulatory Visit (INDEPENDENT_AMBULATORY_CARE_PROVIDER_SITE_OTHER): Payer: Medicare Other

## 2017-10-28 ENCOUNTER — Other Ambulatory Visit: Payer: Self-pay

## 2017-10-28 VITALS — BP 138/70 | HR 60 | Ht 64.0 in | Wt 192.0 lb

## 2017-10-28 DIAGNOSIS — Z0000 Encounter for general adult medical examination without abnormal findings: Secondary | ICD-10-CM

## 2017-10-28 DIAGNOSIS — Z1239 Encounter for other screening for malignant neoplasm of breast: Secondary | ICD-10-CM

## 2017-10-28 DIAGNOSIS — Z23 Encounter for immunization: Secondary | ICD-10-CM

## 2017-10-28 MED ORDER — ZOSTER VAC RECOMB ADJUVANTED 50 MCG/0.5ML IM SUSR
0.5000 mL | Freq: Once | INTRAMUSCULAR | 1 refills | Status: AC
Start: 2017-10-28 — End: 2017-10-28

## 2017-10-28 NOTE — Progress Notes (Signed)
Subjective:   Sabrina Ramirez is a 77 y.o. female who presents for Medicare Annual (Subsequent) preventive examination.  Review of Systems:  No ROS.  Medicare Wellness Visit. Additional risk factors are reflected in the social history.  Cardiac Risk Factors include: advanced age (>42men, >12 women);hypertension;obesity (BMI >30kg/m2);family history of premature cardiovascular disease Sleep patterns: Sleeps 9 hours.  Home Safety/Smoke Alarms: Feels safe in home. Smoke alarms in place.  Living environment; residence and Firearm Safety: Lives alone in 1 story home. Step (1) at door.  Seat Belt Safety/Bike Helmet: Wears seat belt.   Female:   Pap-N/A       Mammo-12/05/2014, BI-RADS CATEGORY  1: Negative. Ordered today.  Dexa scan-07/05/2012, normal. Declines testing.  CCS-Colonoscopy, pt reports < 10 years. Reports normal (IBS). [place].      Objective:     Vitals: BP 138/70 (BP Location: Left Arm, Patient Position: Sitting, Cuff Size: Normal)   Pulse 60   Ht 5\' 4"  (1.626 m)   Wt 192 lb (87.1 kg)   SpO2 98%   BMI 32.96 kg/m   Body mass index is 32.96 kg/m.  Advanced Directives 10/28/2017 08/06/2017 10/11/2016  Does Patient Have a Medical Advance Directive? Yes Yes Yes  Type of Paramedic of [place];Living will [place];Living will Living will;Healthcare Power of Attorney  Does patient want to make changes to medical advance directive? - No - Patient declined -  Copy of [place] in Chart? No - copy requested No - copy requested -    Tobacco Social History   Tobacco Use  Smoking Status Never Smoker  Smokeless Tobacco Never Used     Counseling given: Not Answered   Past Medical History:  Diagnosis Date  . Allergic asthma, mild intermittent, uncomplicated 24/23/5361   Dr. Donneta Romberg, allergist  . Arthritis   . Chronic kidney disease    Stage III kidney disease  . GERD (gastroesophageal reflux disease)   .  Hemorrhoids   . History of kidney stones   . Hypertension   . IBS (irritable bowel syndrome)   . Psoriasis    Past Surgical History:  Procedure Laterality Date  . ABDOMINAL HYSTERECTOMY  1987  . CATARACT EXTRACTION W/ INTRAOCULAR LENS  IMPLANT, BILATERAL Bilateral 2015  . CHOLECYSTECTOMY    . HEMORRHOID SURGERY    . SPINE SURGERY  07/10/2017  . TONSILLECTOMY     Family History  Problem Relation Age of Onset  . Stroke Mother   . Diabetes Father   . Heart attack Father   . Heart disease Father   . Hyperlipidemia Father   . Hypertension Father   . Stroke Father   . Diabetes Sister   . Hypertension Sister   . Transient ischemic attack Sister   . COPD Maternal Grandmother   . Heart disease Maternal Grandmother   . Deep vein thrombosis Maternal Grandfather   . Stroke Maternal Grandfather   . Deep vein thrombosis Paternal Grandmother   . Hypertension Paternal Grandmother   . Hypertension Daughter   . Arthritis Maternal Aunt   . COPD Maternal Aunt   . Hypertension Maternal Aunt   . Diabetes Paternal Uncle   . Heart disease Paternal Uncle   . Stroke Paternal Uncle    Social History   Socioeconomic History  . Marital status: Divorced    Spouse name: Not on file  . Number of children: Not on file  . Years of education: Not on file  .  Highest education level: Not on file  Occupational History  . Not on file  Social Needs  . Financial resource strain: Not on file  . Food insecurity:    Worry: Not on file    Inability: Not on file  . Transportation needs:    Medical: Not on file    Non-medical: Not on file  Tobacco Use  . Smoking status: Never Smoker  . Smokeless tobacco: Never Used  Substance and Sexual Activity  . Alcohol use: Yes    Comment: occassionally  . Drug use: No  . Sexual activity: Never  Lifestyle  . Physical activity:    Days per week: Not on file    Minutes per session: Not on file  . Stress: Not on file  Relationships  . Social connections:      Talks on phone: Not on file    Gets together: Not on file    Attends religious service: Not on file    Active member of club or organization: Not on file    Attends meetings of clubs or organizations: Not on file    Relationship status: Not on file  Other Topics Concern  . Not on file  Social History Narrative  . Not on file    Outpatient Encounter Medications as of 10/28/2017  Medication Sig  . Albuterol Sulfate (PROAIR RESPICLICK) 981 (90 Base) MCG/ACT AEPB Inhale 1-2 puffs into the lungs every 6 (six) hours as needed (for wheezing/shortness of breath).   Marland Kitchen aspirin EC 81 MG tablet Take 1 tablet (81 mg total) by mouth at bedtime.  Marland Kitchen azelastine (ASTELIN) 0.1 % nasal spray Place 1 spray into the nose daily as needed (for allergies.).   Marland Kitchen cetirizine (ZYRTEC) 10 MG tablet Take 10 mg by mouth daily.  . Cholecalciferol (VITAMIN D3) 2000 units TABS Take 2,000 Units by mouth daily.  Marland Kitchen CINNAMON PO Take 1.91 applicators by mouth daily. 1/4 teaspoon in coffee grounds  . fluocinonide cream (LIDEX) 0.05 % APPLY SPARINGLY  TO AFFECTED AREA THREE TIMES DAILY for psoriasis flares. Do not use longer than 2 weeks.  . fluticasone furoate-vilanterol (BREO ELLIPTA) 200-25 MCG/INH AEPB Inhale 1 puff into the lungs daily.  . Glucosamine-Chondroitin (COSAMIN DS PO) Take 2 tablets by mouth daily.  . metoprolol succinate (TOPROL-XL) 100 MG 24 hr tablet Take 100 mg by mouth daily.  . montelukast (SINGULAIR) 10 MG tablet Take 10 mg by mouth at bedtime.   . Multiple Vitamin (MULTIVITAMIN) tablet Take 1 tablet by mouth daily. Senior Multivitamin  . nystatin cream (MYCOSTATIN) Apply 1 application topically 2 (two) times daily as needed (for skin irritation.). FOR FUNGAL INFECTIONS UNDER BREAST OR IN GROIN AREA  . omeprazole (PRILOSEC) 20 MG capsule Take 20 mg by mouth daily.  Marland Kitchen triamcinolone cream (KENALOG) 0.1 % Apply 1 application topically 2 (two) times daily as needed (for skin rash (Summertime)).   . TURMERIC  PO Take by mouth.  . methocarbamol (ROBAXIN) 500 MG tablet Take 1 tablet (500 mg total) by mouth every 6 (six) hours as needed for muscle spasms. (Patient not taking: Reported on 10/28/2017)  . oxyCODONE-acetaminophen (PERCOCET/ROXICET) 5-325 MG tablet Take 1-2 tablets by mouth every 4 (four) hours as needed for severe pain. (Patient not taking: Reported on 10/28/2017)  . simvastatin (ZOCOR) 20 MG tablet Take 1 tablet (20 mg total) by mouth at bedtime. (Patient not taking: Reported on 07/28/2017)  . Zoster Vaccine Adjuvanted [facility]) injection Inject 0.5 mLs into the muscle once for 1 dose.  No facility-administered encounter medications on file as of 10/28/2017.     Activities of Daily Living In your present state of health, do you have any difficulty performing the following activities: 10/28/2017 08/06/2017  Hearing? N N  Vision? N N  Difficulty concentrating or making decisions? N N  Walking or climbing stairs? N Y  Dressing or bathing? N N  Doing errands, shopping? N -  Preparing Food and eating ? N -  Using the Toilet? N -  In the past six months, have you accidently leaked urine? N -  Do you have problems with loss of bowel control? N -  Managing your Medications? N -  Managing your Finances? N -  Housekeeping or managing your Housekeeping? N -  Some recent data might be hidden    Patient Care Team: Leamon Arnt, MD as PCP - General (Family Medicine) Mosetta Anis, MD as Referring Physician (Allergy) Consuella Lose, MD as Consulting Physician (Neurosurgery) Mottinger, Sharyn Lull (Dentistry)    Assessment:   This is a routine wellness examination for [place].  Exercise Activities and Dietary recommendations Current Exercise Habits: Home exercise routine, Type of exercise: Other - see comments(PT exercises; standing/balancing exercises), Time (Minutes): 15, Frequency (Times/Week): 1, Weekly Exercise (Minutes/Week): 15, Exercise limited by: None identified Diet (meal  preparation, eat out, water intake, caffeinated beverages, dairy products, fruits and vegetables): Drinks water.   Breakfast: yogurt; egg; toast; coffee Lunch: sandwich Dinner: protein and vegetables.     Goals    . Patient Stated     Maintain current health by increasing activity.         Depression Screen PHQ 2/9 Scores 10/28/2017  PHQ - 2 Score 0     Cognitive Function       Ad8 score reviewed for issues:  Issues making decisions: no  Less interest in hobbies / activities: no  Repeats questions, stories (family complaining): no  Trouble using ordinary gadgets (microwave, computer, phone): no  Forgets the month or year: no  Mismanaging finances: no  Remembering appts: no  Daily problems with thinking and/or memory: no Ad8 score is=0     Immunization History  Administered Date(s) Administered  . Influenza, High Dose Seasonal PF 03/02/2012, 04/06/2013, 04/21/2014, 04/16/2015, 03/06/2016, 04/09/2017  . Influenza-Unspecified 04/12/2011  . Pneumococcal Conjugate-13 10/05/2014  . Pneumococcal Polysaccharide-23 04/23/2010  . Pneumococcal-Unspecified 04/23/2010  . Tdap 05/23/2010  . Zoster 06/09/2004     Screening Tests Health Maintenance  Topic Date Due  . INFLUENZA VACCINE  01/07/2018  . TETANUS/TDAP  05/23/2020  . DEXA SCAN  Completed  . PNA vac Low Risk Adult  Completed        Plan:     Shingles vaccine at pharmacy.   Schedule mammogram.  Call regarding PT and wrist consult.   Continue doing brain stimulating activities (puzzles, reading, adult coloring books, staying active) to keep memory sharp.   Bring a copy of your living will and/or healthcare power of attorney to your next office visit.  I have personally reviewed and noted the following in the patient's chart:   . Medical and social history . Use of alcohol, tobacco or illicit drugs  . Current medications and supplements . Functional ability and status . Nutritional  status . Physical activity . Advanced directives . List of other physicians . Hospitalizations, surgeries, and ER visits in previous 12 months . Vitals . Screenings to include cognitive, depression, and falls . Referrals and appointments  In addition, I have reviewed and discussed with  patient certain preventive protocols, quality metrics, and best practice recommendations. A written personalized care plan for preventive services as well as general preventive health recommendations were provided to patient.     Gerilyn Nestle, RN  10/28/2017

## 2017-10-28 NOTE — Patient Instructions (Addendum)
Shingles vaccine at pharmacy.   Schedule mammogram.  Call regarding PT and wrist consult.   Continue doing brain stimulating activities (puzzles, reading, adult coloring books, staying active) to keep memory sharp.   Bring a copy of your living will and/or healthcare power of attorney to your next office visit.  Health Maintenance, Female Adopting a healthy lifestyle and getting preventive care can go a long way to promote health and wellness. Talk with your health care provider about what schedule of regular examinations is right for you. This is a good chance for you to check in with your provider about disease prevention and staying healthy. In between checkups, there are plenty of things you can do on your own. Experts have done a lot of research about which lifestyle changes and preventive measures are most likely to keep you healthy. Ask your health care provider for more information. Weight and diet Eat a healthy diet  Be sure to include plenty of vegetables, fruits, low-fat dairy products, and lean protein.  Do not eat a lot of foods high in solid fats, added sugars, or salt.  Get regular exercise. This is one of the most important things you can do for your health. ? Most adults should exercise for at least 150 minutes each week. The exercise should increase your heart rate and make you sweat (moderate-intensity exercise). ? Most adults should also do strengthening exercises at least twice a week. This is in addition to the moderate-intensity exercise.  Maintain a healthy weight  Body mass index (BMI) is a measurement that can be used to identify possible weight problems. It estimates body fat based on height and weight. Your health care provider can help determine your BMI and help you achieve or maintain a healthy weight.  For females 60 years of age and older: ? A BMI below 18.5 is considered underweight. ? A BMI of 18.5 to 24.9 is normal. ? A BMI of 25 to 29.9 is  considered overweight. ? A BMI of 30 and above is considered obese.  Watch levels of cholesterol and blood lipids  You should start having your blood tested for lipids and cholesterol at 77 years of age, then have this test every 5 years.  You may need to have your cholesterol levels checked more often if: ? Your lipid or cholesterol levels are high. ? You are older than 77 years of age. ? You are at high risk for heart disease.  Cancer screening Lung Cancer  Lung cancer screening is recommended for adults 64-61 years old who are at high risk for lung cancer because of a history of smoking.  A yearly low-dose CT scan of the lungs is recommended for people who: ? Currently smoke. ? Have quit within the past 15 years. ? Have at least a 30-pack-year history of smoking. A pack year is smoking an average of one pack of cigarettes a [place] for 1 year.  Yearly screening should continue until it has been 15 years since you quit.  Yearly screening should stop if you develop a health problem that would prevent you from having lung cancer treatment.  Breast Cancer  Practice breast self-awareness. This means understanding how your breasts normally appear and feel.  It also means doing regular breast self-exams. Let your health care provider know about any changes, no matter how small.  If you are in your 20s or 30s, you should have a clinical breast exam (CBE) by a health care provider every 1-3 years as  part of a regular health exam.  If you are 40 or older, have a CBE every year. Also consider having a breast X-ray (mammogram) every year.  If you have a family history of breast cancer, talk to your health care provider about genetic screening.  If you are at high risk for breast cancer, talk to your health care provider about having an MRI and a mammogram every year.  Breast cancer gene (BRCA) assessment is recommended for women who have family members with BRCA-related cancers.  BRCA-related cancers include: ? Breast. ? Ovarian. ? Tubal. ? Peritoneal cancers.  Results of the assessment will determine the need for genetic counseling and BRCA1 and BRCA2 testing.  Cervical Cancer Your health care provider may recommend that you be screened regularly for cancer of the pelvic organs (ovaries, uterus, and vagina). This screening involves a pelvic examination, including checking for microscopic changes to the surface of your cervix (Pap test). You may be encouraged to have this screening done every 3 years, beginning at age 73.  For women ages 67-65, health care providers may recommend pelvic exams and Pap testing every 3 years, or they may recommend the Pap and pelvic exam, combined with testing for human papilloma virus (HPV), every 5 years. Some types of HPV increase your risk of cervical cancer. Testing for HPV may also be done on women of any age with unclear Pap test results.  Other health care providers may not recommend any screening for nonpregnant women who are considered low risk for pelvic cancer and who do not have symptoms. Ask your health care provider if a screening pelvic exam is right for you.  If you have had past treatment for cervical cancer or a condition that could lead to cancer, you need Pap tests and screening for cancer for at least 20 years after your treatment. If Pap tests have been discontinued, your risk factors (such as having a new sexual partner) need to be reassessed to determine if screening should resume. Some women have medical problems that increase the chance of getting cervical cancer. In these cases, your health care provider may recommend more frequent screening and Pap tests.  Colorectal Cancer  This type of cancer can be detected and often prevented.  Routine colorectal cancer screening usually begins at 77 years of age and continues through 77 years of age.  Your health care provider may recommend screening at an earlier age if  you have risk factors for colon cancer.  Your health care provider may also recommend using home test kits to check for hidden blood in the stool.  A small camera at the end of a tube can be used to examine your colon directly (sigmoidoscopy or colonoscopy). This is done to check for the earliest forms of colorectal cancer.  Routine screening usually begins at age 52.  Direct examination of the colon should be repeated every 5-10 years through 77 years of age. However, you may need to be screened more often if early forms of precancerous polyps or small growths are found.  Skin Cancer  Check your skin from head to toe regularly.  Tell your health care provider about any new moles or changes in moles, especially if there is a change in a mole's shape or color.  Also tell your health care provider if you have a mole that is larger than the size of a pencil eraser.  Always use sunscreen. Apply sunscreen liberally and repeatedly throughout the [place].  Protect yourself by wearing  long sleeves, pants, a wide-brimmed hat, and sunglasses whenever you are outside.  Heart disease, diabetes, and high blood pressure  High blood pressure causes heart disease and increases the risk of stroke. High blood pressure is more likely to develop in: ? People who have blood pressure in the high end of the normal range (130-139/85-89 mm Hg). ? People who are overweight or obese. ? People who are African American.  If you are 30-49 years of age, have your blood pressure checked every 3-5 years. If you are 21 years of age or older, have your blood pressure checked every year. You should have your blood pressure measured twice-once when you are at a hospital or clinic, and once when you are not at a hospital or clinic. Record the average of the two measurements. To check your blood pressure when you are not at a hospital or clinic, you can use: ? An automated blood pressure machine at a pharmacy. ? A home blood  pressure monitor.  If you are between 65 years and 83 years old, ask your health care provider if you should take aspirin to prevent strokes.  Have regular diabetes screenings. This involves taking a blood sample to check your fasting blood sugar level. ? If you are at a normal weight and have a low risk for diabetes, have this test once every three years after 77 years of age. ? If you are overweight and have a high risk for diabetes, consider being tested at a younger age or more often. Preventing infection Hepatitis B  If you have a higher risk for hepatitis B, you should be screened for this virus. You are considered at high risk for hepatitis B if: ? You were born in a country where hepatitis B is common. Ask your health care provider which countries are considered high risk. ? Your parents were born in a high-risk country, and you have not been immunized against hepatitis B (hepatitis B vaccine). ? You have HIV or AIDS. ? You use needles to inject street drugs. ? You live with someone who has hepatitis B. ? You have had sex with someone who has hepatitis B. ? You get hemodialysis treatment. ? You take certain medicines for conditions, including cancer, organ transplantation, and autoimmune conditions.  Hepatitis C  Blood testing is recommended for: ? Everyone born from [birth year] through 1965. ? Anyone with known risk factors for hepatitis C.  Sexually transmitted infections (STIs)  You should be screened for sexually transmitted infections (STIs) including gonorrhea and chlamydia if: ? You are sexually active and are younger than 77 years of age. ? You are older than 77 years of age and your health care provider tells you that you are at risk for this type of infection. ? Your sexual activity has changed since you were last screened and you are at an increased risk for chlamydia or gonorrhea. Ask your health care provider if you are at risk.  If you do not have HIV, but are at risk,  it may be recommended that you take a prescription medicine daily to prevent HIV infection. This is called pre-exposure prophylaxis (PrEP). You are considered at risk if: ? You are sexually active and do not regularly use condoms or know the HIV status of your partner(s). ? You take drugs by injection. ? You are sexually active with a partner who has HIV.  Talk with your health care provider about whether you are at high risk of being infected with HIV.  If you choose to begin PrEP, you should first be tested for HIV. You should then be tested every 3 months for as long as you are taking PrEP. Pregnancy  If you are premenopausal and you may become pregnant, ask your health care provider about preconception counseling.  If you may become pregnant, take 400 to 800 micrograms (mcg) of folic acid every [place].  If you want to prevent pregnancy, talk to your health care provider about birth control (contraception). Osteoporosis and menopause  Osteoporosis is a disease in which the bones lose minerals and strength with aging. This can result in serious bone fractures. Your risk for osteoporosis can be identified using a bone density scan.  If you are 18 years of age or older, or if you are at risk for osteoporosis and fractures, ask your health care provider if you should be screened.  Ask your health care provider whether you should take a calcium or vitamin D supplement to lower your risk for osteoporosis.  Menopause may have certain physical symptoms and risks.  Hormone replacement therapy may reduce some of these symptoms and risks. Talk to your health care provider about whether hormone replacement therapy is right for you. Follow these instructions at home:  Schedule regular health, dental, and eye exams.  Stay current with your immunizations.  Do not use any tobacco products including cigarettes, chewing tobacco, or electronic cigarettes.  If you are pregnant, do not drink  alcohol.  If you are breastfeeding, limit how much and how often you drink alcohol.  Limit alcohol intake to no more than 1 drink per [place] for nonpregnant women. One drink equals 12 ounces of beer, 5 ounces of wine, or 1 ounces of hard liquor.  Do not use street drugs.  Do not share needles.  Ask your health care provider for help if you need support or information about quitting drugs.  Tell your health care provider if you often feel depressed.  Tell your health care provider if you have ever been abused or do not feel safe at home. This information is not intended to replace advice given to you by your health care provider. Make sure you discuss any questions you have with your health care provider. Document Released: 12/09/2010 Document Revised: 11/01/2015 Document Reviewed: 02/27/2015 Elsevier Interactive Patient Education  [employer].

## 2017-11-03 NOTE — Progress Notes (Signed)
I have reviewed the attached AWV and agree with findings.

## 2017-11-16 ENCOUNTER — Other Ambulatory Visit: Payer: Self-pay | Admitting: Family Medicine

## 2017-11-16 MED ORDER — METOPROLOL SUCCINATE ER 100 MG PO TB24: 100.0000 mg | ORAL_TABLET | Freq: Every day | ORAL | 3 refills | Status: DC

## 2017-11-16 NOTE — Telephone Encounter (Signed)
Metoprolol refill. Currently listed as historical med Last OV: 05/25/17 PCP: Dr. Jonni Sanger Pharmacy:[facility] [address]

## 2017-11-16 NOTE — Telephone Encounter (Signed)
Copied from [place] [id number]. Topic: Quick Communication - See Telephone Encounter >> Nov 16, 2017 11:19 AM Conception Chancy, NT wrote: CRM for notification. See Telephone encounter for: 11/16/17.  Patient is calling and requesting a refill on metoprolol succinate (TOPROL-XL) 100 MG 24 hr tablet. She states if she calls the pharmacy they will contact [facility] where Dr. Jonni Sanger came from. Please advise.   [place] ([address] [place] [address] [place] ([place]) [place] [address] Phone: [phone] Fax: [phone]

## 2017-11-30 ENCOUNTER — Ambulatory Visit
Admission: RE | Admit: 2017-11-30 | Discharge: 2017-11-30 | Disposition: A | Discharge status: Home / Self Care | Payer: Medicare Other | Source: Ambulatory Visit | Attending: Family Medicine | Admitting: Family Medicine

## 2017-11-30 DIAGNOSIS — Z1239 Encounter for other screening for malignant neoplasm of breast: Secondary | ICD-10-CM

## 2017-12-01 ENCOUNTER — Ambulatory Visit: Payer: Medicare Other | Admitting: Family Medicine

## 2017-12-01 ENCOUNTER — Encounter: Payer: Self-pay | Admitting: Family Medicine

## 2017-12-01 ENCOUNTER — Other Ambulatory Visit: Payer: Self-pay

## 2017-12-01 VITALS — BP 120/80 | HR 52 | Temp 97.8°F | Resp 14 | Ht 63.5 in | Wt 191.4 lb

## 2017-12-01 DIAGNOSIS — L409 Psoriasis, unspecified: Secondary | ICD-10-CM | POA: Diagnosis not present

## 2017-12-01 DIAGNOSIS — I10 Essential (primary) hypertension: Secondary | ICD-10-CM | POA: Diagnosis not present

## 2017-12-01 DIAGNOSIS — Z79899 Other long term (current) drug therapy: Secondary | ICD-10-CM | POA: Insufficient documentation

## 2017-12-01 DIAGNOSIS — N183 Chronic kidney disease, stage 3 unspecified: Secondary | ICD-10-CM

## 2017-12-01 DIAGNOSIS — E781 Pure hyperglyceridemia: Secondary | ICD-10-CM | POA: Diagnosis not present

## 2017-12-01 LAB — LIPID PANEL
CHOLESTEROL: 190 mg/dL (ref 0–200)
HDL: 48.1 mg/dL (ref >39.00)
LDL CALC: 111 mg/dL — AB (ref 0–99)
NonHDL: 142.19
Total CHOL/HDL Ratio: 4
Triglycerides: 158 mg/dL — ABNORMAL HIGH (ref 0.0–149.0)
VLDL: 31.6 mg/dL (ref 0.0–40.0)

## 2017-12-01 LAB — BASIC METABOLIC PANEL
BUN: 32 mg/dL — AB (ref 6–23)
CALCIUM: 9.6 mg/dL (ref 8.4–10.5)
CHLORIDE: 104 meq/L (ref 96–112)
CO2: 28 mEq/L (ref 19–32)
CREATININE: 1.72 mg/dL — AB (ref 0.40–1.20)
GFR: 30.6 mL/min — ABNORMAL LOW (ref >60.00)
Glucose, Bld: 117 mg/dL — ABNORMAL HIGH (ref 70–99)
Potassium: 4.4 mEq/L (ref 3.5–5.1)
Sodium: 141 mEq/L (ref 135–145)

## 2017-12-01 MED ORDER — ROSUVASTATIN CALCIUM 5 MG PO TABS: 2.5000 mg | ORAL_TABLET | Freq: Every day | ORAL | 2 refills | Status: DC

## 2017-12-01 MED ORDER — NYSTATIN 100000 UNIT/GM EX CREA: 1.0000 "application " | TOPICAL_CREAM | Freq: Two times a day (BID) | CUTANEOUS | 5 refills | Status: DC | PRN

## 2017-12-01 MED ORDER — FLUOCINONIDE 0.05 % EX CREA: TOPICAL_CREAM | CUTANEOUS | 5 refills | Status: DC

## 2017-12-01 NOTE — Patient Instructions (Addendum)
Please return in 3 months for your annual complete physical; please come fasting.   If you have any questions or concerns, please don't hesitate to send me a message via MyChart or call the office at [phone]. Thank you for visiting with Sabrina Ramirez today! It's our pleasure caring for you.  Start the crestor twice a week, then increase as tolerated to daily at bedtime. Let me know if you have a problem with the medication.   I will release your lab results to you on your MyChart account with further instructions. Please reply with any questions.

## 2017-12-01 NOTE — Progress Notes (Signed)
Subjective  CC:  Chief Complaint  Patient presents with  . Hypertension    Doing well    HPI: Sabrina Ramirez is a 77 y.o. female who presents to the office today to address the problems listed above in the chief complaint.  Hypertension f/u: Control is good . Pt reports she is doing well. taking medications as instructed, no medication side effects noted, no TIAs, no chest pain on exertion, no dyspnea on exertion, no swelling of ankles. Has slow heart rate w/o sxs of bradycardia. No longer with sob/doe.  She denies adverse effects from his BP medications. Compliance with medication is good.   Lipids: elevated atherosclerotic disease risk score but did not tolerate simvastatin due to not feeling well.  Denied specific myalgias.  Would be willing to try again.  Eating better and weight is down about 6 pounds in the last 3 months.  Still working on increasing her activity levels.  Chronic kidney disease: Stable when last checked in March after her surgery.  Due for recheck.  No symptoms of volume overload: No lower extremity edema, shortness of breath, orthopnea.  Psoriasis: Due for refill of her steroid cream.  Only intermittent flares.  Status post spine surgery in March.  Feeling well.  Assessment  1. Benign essential hypertension   2. Hypertriglyceridemia   3. CKD (chronic kidney disease) stage 3, GFR 30-59 ml/min (HCC)   4. On statin therapy due to risk of future cardiovascular event   5. Psoriasis      Plan    Hypertension f/u: BP control is well controlled.  Continue current medication.  Monitor for bradycardia and symptoms.  Patient educated on symptoms of low heart rate.  Hyperlipidemia f/u: Start trial of Crestor.  Educated on risk versus benefits of statin in the setting of an elevated cardiovascular risk score.  Chronic kidney disease: Due for recheck.  Stable clinically.  Psoriasis: Well maintained.  Intermittent steroid use as needed.  Medication  refilled. Education regarding management of these chronic disease states was given. Management strategies discussed on successive visits include dietary and exercise recommendations, goals of achieving and maintaining IBW, and lifestyle modifications aiming for adequate sleep and minimizing stressors.   Follow up: Return in about 3 months (around 03/03/2018) for complete physical.  Orders Placed This Encounter  Procedures  . Basic metabolic panel  . Lipid panel   Meds ordered this encounter  Medications  . rosuvastatin (CRESTOR) 5 MG tablet    Sig: Take 0.5 tablets (2.5 mg total) by mouth at bedtime.    Dispense:  30 tablet    Refill:  2  . nystatin cream (MYCOSTATIN)    Sig: Apply 1 application topically 2 (two) times daily as needed (for skin irritation.). FOR FUNGAL INFECTIONS UNDER BREAST OR IN GROIN AREA    Dispense:  30 g    Refill:  5  . fluocinonide cream (LIDEX) 0.05 %    Sig: APPLY SPARINGLY  TO AFFECTED AREA THREE TIMES DAILY for psoriasis flares. Do not use longer than 2 weeks.    Dispense:  30 g    Refill:  5      BP Readings from Last 3 Encounters:  12/01/17 120/80  10/28/17 138/70  08/14/17 (!) 127/46   Wt Readings from Last 3 Encounters:  12/01/17 191 lb 6.4 oz (86.8 kg)  10/28/17 192 lb (87.1 kg)  08/13/17 197 lb 6.4 oz (89.5 kg)    No results found for: CHOL Lab Results  Component Value  Date   HDL 45 01/23/2017   No results found for: LDLCALC No results found for: TRIG No results found for: CHOLHDL No results found for: LDLDIRECT Lab Results  Component Value Date   CREATININE 1.82 (H) 08/14/2017   BUN 30 (H) 08/14/2017   NA 140 08/14/2017   K 4.4 08/14/2017   CL 104 08/14/2017   CO2 24 08/14/2017    The 10-year ASCVD risk score Mikey Bussing DC Jr., et al., 2013) is: 20.4%*   Values used to calculate the score:     Age: 63 years     Sex: Female     Is Non-Hispanic African American: No     Diabetic: No     Tobacco smoker: No     Systolic Blood  Pressure: 120 mmHg     Is BP treated: Yes     HDL Cholesterol: 45 mg/dL*     Total Cholesterol: 182 mg/dL     * - Cholesterol units were assumed for this score calculation  I reviewed the patients updated PMH, FH, and SocHx.    Patient Active Problem List   Diagnosis Date Noted  . Obesity, Class I, BMI 30-34.9 04/16/2015    Priority: High  . Hypertriglyceridemia 09/22/2011    Priority: High  . Benign essential hypertension 05/23/2010    Priority: High  . Colon polyp 05/25/2017    Priority: Medium  . Ophthalmic migraine 05/25/2017    Priority: Medium  . Essential tremor 05/25/2017    Priority: Medium  . Irritable bowel syndrome with diarrhea 07/08/2010    Priority: Medium  . CKD (chronic kidney disease) stage 3, GFR 30-59 ml/min (HCC) 05/23/2010    Priority: Medium  . Chronic allergic rhinitis 02/25/2010    Priority: Medium  . GERD (gastroesophageal reflux disease) 02/25/2010    Priority: Medium  . Osteoarthritis, multiple sites 06/12/2009    Priority: Medium  . Conjunctivitis, allergic, chronic 10/01/2012    Priority: Low  . Rosacea 06/25/2011    Priority: Low  . Psoriasis 02/25/2010    Priority: Low  . On statin therapy due to risk of future cardiovascular event 12/01/2017  . Spondylolisthesis at L5-S1 level 08/13/2017  . Allergic asthma, mild intermittent, uncomplicated 89/38/1017    Allergies: Amlodipine; Flexeril [cyclobenzaprine]; Ace inhibitors; Adhesive [tape]; Angiotensin receptor blockers; Corn-containing products; Iodine; and Triamterene-hctz  Social History: Patient  reports that she has never smoked. She has never used smokeless tobacco. She reports that she drinks alcohol. She reports that she does not use drugs.  Current Meds  Medication Sig  . Albuterol Sulfate (PROAIR RESPICLICK) 510 (90 Base) MCG/ACT AEPB Inhale 1-2 puffs into the lungs every 6 (six) hours as needed (for wheezing/shortness of breath).   Marland Kitchen aspirin EC 81 MG tablet Take 1 tablet (81 mg  total) by mouth at bedtime.  Marland Kitchen azelastine (ASTELIN) 0.1 % nasal spray Place 1 spray into the nose daily as needed (for allergies.).   Marland Kitchen cetirizine (ZYRTEC) 10 MG tablet Take 10 mg by mouth daily.  . Cholecalciferol (VITAMIN D3) 2000 units TABS Take 2,000 Units by mouth daily.  Marland Kitchen CINNAMON PO Take 2.58 applicators by mouth daily. 1/4 teaspoon in coffee grounds  . fluocinonide cream (LIDEX) 0.05 % APPLY SPARINGLY  TO AFFECTED AREA THREE TIMES DAILY for psoriasis flares. Do not use longer than 2 weeks.  . fluticasone furoate-vilanterol (BREO ELLIPTA) 200-25 MCG/INH AEPB Inhale 1 puff into the lungs daily.  . Glucosamine-Chondroitin (COSAMIN DS PO) Take 2 tablets by mouth daily.  Marland Kitchen  Guaifenesin 1200 MG TB12 Take 1 tablet by mouth as needed.  . metoprolol succinate (TOPROL-XL) 100 MG 24 hr tablet Take 1 tablet (100 mg total) by mouth daily.  . montelukast (SINGULAIR) 10 MG tablet Take 10 mg by mouth at bedtime.   . Multiple Vitamin (MULTIVITAMIN) tablet Take 1 tablet by mouth daily. Senior Multivitamin  . nystatin cream (MYCOSTATIN) Apply 1 application topically 2 (two) times daily as needed (for skin irritation.). FOR FUNGAL INFECTIONS UNDER BREAST OR IN GROIN AREA  . omeprazole (PRILOSEC) 20 MG capsule Take 20 mg by mouth daily.  Vladimir Faster Glycol-Propyl Glycol (SYSTANE ULTRA) 0.4-0.3 % SOLN Apply 1 drop to eye as needed.  . triamcinolone cream (KENALOG) 0.1 % Apply 1 application topically 2 (two) times daily as needed (for skin rash (Summertime)).   . Turmeric POWD Take by mouth.  . [DISCONTINUED] fluocinonide cream (LIDEX) 0.05 % APPLY SPARINGLY  TO AFFECTED AREA THREE TIMES DAILY for psoriasis flares. Do not use longer than 2 weeks.  . [DISCONTINUED] nystatin cream (MYCOSTATIN) Apply 1 application topically 2 (two) times daily as needed (for skin irritation.). FOR FUNGAL INFECTIONS UNDER BREAST OR IN GROIN AREA    Review of Systems: Cardiovascular: negative for chest pain, palpitations, leg  swelling, orthopnea Respiratory: negative for SOB, wheezing or persistent cough Gastrointestinal: negative for abdominal pain Genitourinary: negative for dysuria or gross hematuria  Objective  Vitals: BP 120/80   Pulse (!) 52   Temp 97.8 F (36.6 C) (Oral)   Resp 14   Ht 5' 3.5" (1.613 m)   Wt 191 lb 6.4 oz (86.8 kg)   SpO2 97%   BMI 33.37 kg/m  General: no acute distress  Psych:  Alert and oriented, normal mood and affect HEENT:  Normocephalic, atraumatic, supple neck  Cardiovascular: Bradycardic, RR without murmur. no edema Respiratory:  Good breath sounds bilaterally, CTAB with normal respiratory effort Skin:  Warm, no rashes Neurologic:   Mental status is normal  Commons side effects, risks, benefits, and alternatives for medications and treatment plan prescribed today were discussed, and the patient expressed understanding of the given instructions. Patient is instructed to call or message via MyChart if he/she has any questions or concerns regarding our treatment plan. No barriers to understanding were identified. We discussed Red Flag symptoms and signs in detail. Patient expressed understanding regarding what to do in case of urgent or emergency type symptoms.   Medication list was reconciled, printed and provided to the patient in AVS. Patient instructions and summary information was reviewed with the patient as documented in the AVS. This note was prepared with assistance of Dragon voice recognition software. Occasional wrong-word or sound-a-like substitutions may have occurred due to the inherent limitations of voice recognition software

## 2017-12-02 ENCOUNTER — Other Ambulatory Visit: Payer: Self-pay | Admitting: Family Medicine

## 2017-12-02 DIAGNOSIS — R928 Other abnormal and inconclusive findings on diagnostic imaging of breast: Secondary | ICD-10-CM

## 2017-12-07 ENCOUNTER — Ambulatory Visit
Admission: RE | Admit: 2017-12-07 | Discharge: 2017-12-07 | Disposition: A | Discharge status: Home / Self Care | Payer: Medicare Other | Source: Ambulatory Visit | Attending: Family Medicine | Admitting: Family Medicine

## 2017-12-07 ENCOUNTER — Ambulatory Visit: Payer: Medicare Other

## 2017-12-07 DIAGNOSIS — R928 Other abnormal and inconclusive findings on diagnostic imaging of breast: Secondary | ICD-10-CM

## 2017-12-11 ENCOUNTER — Other Ambulatory Visit: Payer: Self-pay | Admitting: Emergency Medicine

## 2018-02-01 ENCOUNTER — Ambulatory Visit: Payer: Medicare Other | Attending: Orthopedic Surgery | Admitting: Physical Therapy

## 2018-02-01 ENCOUNTER — Other Ambulatory Visit: Payer: Self-pay

## 2018-02-01 DIAGNOSIS — M545 Low back pain: Secondary | ICD-10-CM | POA: Insufficient documentation

## 2018-02-01 DIAGNOSIS — G8929 Other chronic pain: Secondary | ICD-10-CM | POA: Insufficient documentation

## 2018-02-01 DIAGNOSIS — M6281 Muscle weakness (generalized): Secondary | ICD-10-CM | POA: Diagnosis present

## 2018-02-01 NOTE — Therapy (Signed)
[place] [place] [address] [place], [name], [address] Phone: [phone]   Fax:  [phone]  Physical Therapy Evaluation  Patient Details  Name: Sabrina Ramirez MRN: [id number] Date of Birth: 03/04/1941 Referring Provider: Ferne Reus, PA-C   Encounter Date: 02/01/2018  PT End of Session - 02/01/18 1024    Visit Number  1    Number of Visits  16    Date for PT Re-Evaluation  03/29/18    PT Start Time  1024    PT Stop Time  1116    PT Time Calculation (min)  52 min    Activity Tolerance  Patient tolerated treatment well    Behavior During Therapy  [facility] for tasks assessed/performed       Past Medical History:  Diagnosis Date  . Allergic asthma, mild intermittent, uncomplicated 78/24/2353   Dr. Donneta Romberg, allergist  . Arthritis   . Chronic kidney disease    Stage III kidney disease  . GERD (gastroesophageal reflux disease)   . Hemorrhoids   . History of kidney stones   . Hypertension   . IBS (irritable bowel syndrome)   . Psoriasis     Past Surgical History:  Procedure Laterality Date  . ABDOMINAL HYSTERECTOMY  1987  . CATARACT EXTRACTION W/ INTRAOCULAR LENS  IMPLANT, BILATERAL Bilateral 2015  . CHOLECYSTECTOMY    . HEMORRHOID SURGERY    . SPINE SURGERY  07/10/2017  . TONSILLECTOMY      There were no vitals filed for this visit.   Subjective Assessment - 02/01/18 1036    Subjective  Patient had lumbar fusion L4-S1 in March 2019. Patient had HHPT but did not keep up with her exercises. She can sit fine, but she has difficulty completing duties in church choir which involves the procession, steps and standing while holding a book. She also is in the bell choir. She can walk better than she could after surgery but feels she is still limited. As she walks more than a block she feels pain bil to greater trochanter areas.     Pertinent History  lumbar fusion 3/19; hysterectomy    Limitations   Walking;Standing    How long can you walk comfortably?  one block    Diagnostic tests  xrays - healed well    Patient Stated Goals  be able to perform church activities without pain    Currently in Pain?  Yes    Pain Score  7     Pain Location  Back    Pain Orientation  Lower;Left;Right    Pain Descriptors / Indicators  Aching    Pain Type  Chronic pain    Pain Radiating Towards  into bil hips    Pain Onset  More than a month ago    Pain Frequency  Intermittent    Aggravating Factors   standing and walking    Pain Relieving Factors  sitting, rest    Effect of Pain on Daily Activities  limited         [facility] PT Assessment - 02/01/18 0001      Assessment   Medical Diagnosis  spondylolisthesis; s/p lumbar fusion    Referring Provider  Ferne Reus, PA-C    Onset Date/Surgical Date  08/13/17    Next MD Visit  6 months    Prior Therapy  HHPT x 2 weeks      Precautions   Precautions  None      Restrictions  Weight Bearing Restrictions  No      Balance Screen   Has the patient fallen in the past 6 months  No    Has the patient had a decrease in activity level because of a fear of falling?   No    Is the patient reluctant to leave their home because of a fear of falling?   No      Home Environment   Living Environment  Private residence    Living Arrangements  Alone    Type of [place] Access  Level entry;Other (comment)   stairs at church (basement); uses incline   Home Layout  Able to live on main level with bedroom/bathroom      Prior Function   Level of Independence  Independent    Leisure  choir      Observation/Other Assessments   Focus on Therapeutic Outcomes (FOTO)   limited 54%      Functional Tests   Functional tests  Step up;Step down;Sit to Stand      Step Up   Comments  good form 8 inch step      Step Down   Comments  unsteady      Sit to Stand   Comments  15 sec      Posture/Postural Control   Posture Comments  left thoracic  scoliosis; depressed R shoulder      ROM / Strength   AROM / PROM / Strength  AROM;Strength      AROM   Overall AROM Comments  Lumbar ROM WFL except bil SB limited 50% with tightness reported      Strength   Strength Assessment Site  Hip;Knee    Right/Left Hip  Right;Left    Right Hip Flexion  4+/5    Right Hip Extension  4+/5    Right Hip ABduction  4-/5    Left Hip Flexion  4/5   felt in back   Left Hip Extension  4+/5    Left Hip ABduction  4/5    Right/Left Knee  Right;Left    Right Knee Flexion  5/5    Right Knee Extension  5/5    Left Knee Flexion  5/5    Left Knee Extension  5/5      Flexibility   Soft Tissue Assessment /Muscle Length  yes    Hamstrings  bil marked    Quadriceps  mild tightness    ITB  +ober bil    Piriformis  right tight      Palpation   Palpation comment  left gluteals, bil ITB and R TFL                Objective measurements completed on examination: See above findings.              PT Education - 02/01/18 1140    Education Details  HEP; patient encouraged to start walking program    Person(s) Educated  Patient    Methods  Explanation;Demonstration;Handout;Verbal cues    Comprehension  Returned demonstration       PT Short Term Goals - 02/01/18 1147      PT SHORT TERM GOAL #1   Title  Patient independent with HEP    Time  2    Period  Weeks    Status  New    Target Date  02/15/18      PT SHORT TERM GOAL #2   Title  Patient to  report decreased pain by 25% with standing and walking    Time  4    Period  Weeks    Status  New    Target Date  03/01/18        PT Long Term Goals - 02/01/18 1149      PT LONG TERM GOAL #1   Title  Patient able to walk in processional for choir with 75% less pain.    Status  New    Target Date  03/29/18      PT LONG TERM GOAL #2   Title  Patient able to stand in choir during church service without having to sit secondary to pain.    Time  8    Period  Weeks    Status  New       PT LONG TERM GOAL #3   Title  Patient able to ascend/descend stairs at church with minimal pain.    Time  8    Period  Weeks      PT LONG TERM GOAL #4   Title  Patient to demo 5/5 BLE strength to improve function.    Time  8    Period  Weeks    Status  New             Plan - 02/01/18 1142    Clinical Impression Statement  Patient presents s/p lumbar fusion on 08/13/17. She has pain with standing and walking which limits her ability to perform in her church choirs. She has flexibility deficits and weakness in the hips/knees with functional activites. She also has core weakness. She will benefit from PT to address these deficits.    History and Personal Factors relevant to plan of care:  lumbar fusion L4-S1    Clinical Presentation  Stable    Clinical Decision Making  Low    Rehab Potential  Excellent    PT Frequency  2x / week    PT Duration  8 weeks    PT Treatment/Interventions  ADLs/Self Care Home Management;Electrical Stimulation;Moist Heat;Therapeutic activities;Therapeutic exercise;Neuromuscular re-education;Patient/family education;Dry needling;Taping;Balance training    PT Next Visit Plan  review HEP; add lumbar stab; hip flexor/ITB flexibility and strengthening    PT Home Exercise Plan  seated HS stretch, LTR, SKTC    Consulted and Agree with Plan of Care  Patient       Patient will benefit from skilled therapeutic intervention in order to improve the following deficits and impairments:  Pain, Decreased activity tolerance, Decreased strength, Impaired flexibility, Postural dysfunction  Visit Diagnosis: Chronic bilateral low back pain, with sciatica presence unspecified - Plan: PT plan of care cert/re-cert  Muscle weakness (generalized) - Plan: PT plan of care cert/re-cert     Problem List Patient Active Problem List   Diagnosis Date Noted  . On statin therapy due to risk of future cardiovascular event 12/01/2017  . Spondylolisthesis at L5-S1 level  08/13/2017  . Colon polyp 05/25/2017  . Ophthalmic migraine 05/25/2017  . Essential tremor 05/25/2017  . Allergic asthma, mild intermittent, uncomplicated 42/59/5638  . Obesity, Class I, BMI 30-34.9 04/16/2015  . Conjunctivitis, allergic, chronic 10/01/2012  . Hypertriglyceridemia 09/22/2011  . Rosacea 06/25/2011  . Irritable bowel syndrome with diarrhea 07/08/2010  . Benign essential hypertension 05/23/2010  . CKD (chronic kidney disease) stage 3, GFR 30-59 ml/min (HCC) 05/23/2010  . Chronic allergic rhinitis 02/25/2010  . GERD (gastroesophageal reflux disease) 02/25/2010  . Psoriasis 02/25/2010  . Osteoarthritis, multiple sites 06/12/2009    Emmagene Ortner  Kojo Liby PT 02/01/2018, 12:01 PM  [place], [name], [address] Phone: [phone]   Fax:  [phone]  Name: Sabrina Ramirez MRN: [id number] Date of Birth: [birth year]

## 2018-02-01 NOTE — Patient Instructions (Signed)
Bridge   Lie back, legs bent. Inhale, pressing hips up. Keeping ribs in, lengthen lower back. Exhale, rolling down along spine from top. Repeat 10-30 times. Do __2__ sessions per Zeek.   Knee to Chest (Flexion)   Pull knee toward chest. Feel stretch in lower back or buttock area. You may need to put the other leg straight to feel a better stretch. Breathing deeply, Hold __30__ seconds. Repeat with other knee. Repeat _3___ times each leg. Do _2___ sessions per Kron.  [email or web address]    Lower Trunk Rotation Stretch   Keeping back flat and feet together, rotate knees to left side. Hold __10__ seconds. Repeat __5__ times per set. Do ____ sets per session. Do _2_ sessions per [place].   Stretching: Hamstring (Sitting)   With right leg straight, tuck other foot near groin. Reach down until stretch is felt in back of thigh. Keep back straight. Hold _30-60___ seconds or longer. Repeat __3__ times per set. Do ____ sets per session. Do _2-3___ sessions per Pledger.   Sabrina Ramirez, PT 02/01/18 11:15 AM [address] [place], [name], [address] Phone: [phone]   Fax:  [phone]

## 2018-02-04 ENCOUNTER — Encounter: Payer: Self-pay | Admitting: Physical Therapy

## 2018-02-04 ENCOUNTER — Ambulatory Visit: Payer: Medicare Other | Admitting: Physical Therapy

## 2018-02-04 DIAGNOSIS — G8929 Other chronic pain: Secondary | ICD-10-CM

## 2018-02-04 DIAGNOSIS — M545 Low back pain: Principal | ICD-10-CM

## 2018-02-04 DIAGNOSIS — M6281 Muscle weakness (generalized): Secondary | ICD-10-CM

## 2018-02-04 NOTE — Therapy (Signed)
[place] [address] [place] [address] [place], [name], [address] Phone: [phone]   Fax:  [phone]  Physical Therapy Treatment  Patient Details  Name: Sabrina Ramirez MRN: [id number] Date of Birth: 05-19-1941 Referring Provider: Ferne Reus, PA-C   Encounter Date: 02/04/2018  PT End of Session - 02/04/18 1600    Visit Number  2    Date for PT Re-Evaluation  03/29/18    PT Start Time  4650    PT Stop Time  1558    PT Time Calculation (min)  43 min    Activity Tolerance  Patient tolerated treatment well    Behavior During Therapy  [facility] for tasks assessed/performed       Past Medical History:  Diagnosis Date  . Allergic asthma, mild intermittent, uncomplicated 35/46/5681   Dr. Donneta Romberg, allergist  . Arthritis   . Chronic kidney disease    Stage III kidney disease  . GERD (gastroesophageal reflux disease)   . Hemorrhoids   . History of kidney stones   . Hypertension   . IBS (irritable bowel syndrome)   . Psoriasis     Past Surgical History:  Procedure Laterality Date  . ABDOMINAL HYSTERECTOMY  1987  . CATARACT EXTRACTION W/ INTRAOCULAR LENS  IMPLANT, BILATERAL Bilateral 2015  . CHOLECYSTECTOMY    . HEMORRHOID SURGERY    . SPINE SURGERY  07/10/2017  . TONSILLECTOMY      There were no vitals filed for this visit.  Subjective Assessment - 02/04/18 1516    Subjective  "I am a little stiff today, but not as stiff as last time"    Currently in Pain?  Yes    Pain Score  3     Pain Location  Back    Pain Orientation  Lower                       [facility] Adult PT Treatment/Exercise - 02/04/18 0001      Exercises   Exercises  Lumbar      Lumbar Exercises: Stretches   Passive Hamstring Stretch  4 reps;10 seconds    Single Knee to Chest Stretch  Left;Right;3 reps;10 seconds    ITB Stretch  Left;Right;4 reps;10 seconds      Lumbar Exercises: Aerobic   UBE (Upper Arm Bike)  L1 3 min each way       Lumbar Exercises: Standing   Row  20 reps;Theraband;Both    Theraband Level (Row)  Level 2 (Red)    Shoulder Extension  Theraband;20 reps;Both    Theraband Level (Shoulder Extension)  Level 2 (Red)    Other Standing Lumbar Exercises  standing march x10, hip abd x5 hip ext x10      Lumbar Exercises: Seated   Long Arc Quad on Chair  Both;2 sets;10 reps;Weights    LAQ on Chair Weights (lbs)  2    Sit to Stand  5 reps   x3      Lumbar Exercises: Supine   Bridge  Compliant;2 seconds;10 reps    Bridge with Ball Squeeze  Compliant;10 reps;2 seconds               PT Short Term Goals - 02/01/18 1147      PT SHORT TERM GOAL #1   Title  Patient independent with HEP    Time  2    Period  Weeks    Status  New    Target Date  02/15/18      PT SHORT TERM GOAL #2   Title  Patient to report decreased pain by 25% with standing and walking    Time  4    Period  Weeks    Status  New    Target Date  03/01/18        PT Long Term Goals - 02/01/18 1149      PT LONG TERM GOAL #1   Title  Patient able to walk in processional for choir with 75% less pain.    Status  New    Target Date  03/29/18      PT LONG TERM GOAL #2   Title  Patient able to stand in choir during church service without having to sit secondary to pain.    Time  8    Period  Weeks    Status  New      PT LONG TERM GOAL #3   Title  Patient able to ascend/descend stairs at church with minimal pain.    Time  8    Period  Weeks      PT LONG TERM GOAL #4   Title  Patient to demo 5/5 BLE strength to improve function.    Time  8    Period  Weeks    Status  New            Plan - 02/04/18 1601    Clinical Impression Statement  Pt tolerated an initial progression fair. HS does fatigue quick with bridging. Tightness noted with passive HS and ITB stretching. Good posture with standing rows and extensions. Pt able to perform sit to stand I without UE use, she does reports some fatigue with the last set. Some  hip pain reported with standing hip abduction.     Rehab Potential  Excellent    PT Frequency  2x / week    PT Duration  8 weeks    PT Treatment/Interventions  ADLs/Self Care Home Management;Electrical Stimulation;Moist Heat;Therapeutic activities;Therapeutic exercise;Neuromuscular re-education;Patient/family education;Dry needling;Taping;Balance training    PT Next Visit Plan  add lumbar stab; hip flexor/ITB flexibility and strengthening       Patient will benefit from skilled therapeutic intervention in order to improve the following deficits and impairments:  Pain, Decreased activity tolerance, Decreased strength, Impaired flexibility, Postural dysfunction  Visit Diagnosis: Chronic bilateral low back pain, with sciatica presence unspecified  Muscle weakness (generalized)     Problem List Patient Active Problem List   Diagnosis Date Noted  . On statin therapy due to risk of future cardiovascular event 12/01/2017  . Spondylolisthesis at L5-S1 level 08/13/2017  . Colon polyp 05/25/2017  . Ophthalmic migraine 05/25/2017  . Essential tremor 05/25/2017  . Allergic asthma, mild intermittent, uncomplicated 16/03/9603  . Obesity, Class I, BMI 30-34.9 04/16/2015  . Conjunctivitis, allergic, chronic 10/01/2012  . Hypertriglyceridemia 09/22/2011  . Rosacea 06/25/2011  . Irritable bowel syndrome with diarrhea 07/08/2010  . Benign essential hypertension 05/23/2010  . CKD (chronic kidney disease) stage 3, GFR 30-59 ml/min (HCC) 05/23/2010  . Chronic allergic rhinitis 02/25/2010  . GERD (gastroesophageal reflux disease) 02/25/2010  . Psoriasis 02/25/2010  . Osteoarthritis, multiple sites 06/12/2009    Scot Jun, PTA 02/04/2018, 4:03 PM  [place], [name], [address] Phone: [phone]   Fax:  [phone]  Name: Sabrina Ramirez MRN: [id number] Date of Birth: 12-18-1940

## 2018-02-09 ENCOUNTER — Ambulatory Visit: Payer: Medicare Other | Attending: Orthopedic Surgery | Admitting: Physical Therapy

## 2018-02-09 ENCOUNTER — Encounter: Payer: Self-pay | Admitting: Physical Therapy

## 2018-02-09 DIAGNOSIS — M6281 Muscle weakness (generalized): Secondary | ICD-10-CM | POA: Diagnosis present

## 2018-02-09 DIAGNOSIS — M545 Low back pain: Secondary | ICD-10-CM | POA: Diagnosis present

## 2018-02-09 DIAGNOSIS — G8929 Other chronic pain: Secondary | ICD-10-CM | POA: Insufficient documentation

## 2018-02-09 NOTE — Therapy (Signed)
[place], [name], [address] Phone: [phone]   Fax:  [phone]  Physical Therapy Treatment  Patient Details  Name: Sabrina Ramirez MRN: [id number] Date of Birth: [birth year] Referring Provider: Ferne Reus, PA-C   Encounter Date: 02/09/2018  PT End of Session - 02/09/18 1249    Visit Number  3    Date for PT Re-Evaluation  03/29/18    PT Start Time  6546    PT Stop Time  1230    PT Time Calculation (min)  45 min       Past Medical History:  Diagnosis Date  . Allergic asthma, mild intermittent, uncomplicated 50/35/4656   Dr. Donneta Romberg, allergist  . Arthritis   . Chronic kidney disease    Stage III kidney disease  . GERD (gastroesophageal reflux disease)   . Hemorrhoids   . History of kidney stones   . Hypertension   . IBS (irritable bowel syndrome)   . Psoriasis     Past Surgical History:  Procedure Laterality Date  . ABDOMINAL HYSTERECTOMY  1987  . CATARACT EXTRACTION W/ INTRAOCULAR LENS  IMPLANT, BILATERAL Bilateral 2015  . CHOLECYSTECTOMY    . HEMORRHOID SURGERY    . SPINE SURGERY  07/10/2017  . TONSILLECTOMY      There were no vitals filed for this visit.  Subjective Assessment - 02/09/18 1151    Subjective  did fine after last session, feeling pretty good    Currently in Pain?  No/denies                       [facility] Adult PT Treatment/Exercise - 02/09/18 0001      Self-Care   Self-Care  Lifting   carrying groceries     Exercises   Exercises  Lumbar;Knee/Hip      Lumbar Exercises: Aerobic   UBE (Upper Arm Bike)  L 2 2 fwd/2 back    Nustep  L 4 5 min   4/10 pain on left     Lumbar Exercises: Machines for Strengthening   Cybex Lumbar Extension  black 2 tband 2 sets 10, plus trunk flexion for core    Other Lumbar Machine Exercise  lats and rows 2 sets 10 15#      Lumbar Exercises: Standing   Other Standing Lumbar Exercises  red tband hip 3  way 15 times each   rest needed with RT stance d/t pain/weakness     Lumbar Exercises: Supine   Bridge  15 reps;Compliant;3 seconds   ball under feet   Other Supine Lumbar Exercises  obl 20 times      Knee/Hip Exercises: Seated   Ball Squeeze  15 3 sec hold    Clamshell with TheraBand  Green    Marching  Both;Strengthening;15 reps   green tband     Manual Therapy   Manual Therapy  Passive ROM    Passive ROM  LE and trunk               PT Short Term Goals - 02/01/18 1147      PT SHORT TERM GOAL #1   Title  Patient independent with HEP    Time  2    Period  Weeks    Status  New    Target Date  02/15/18      PT SHORT TERM GOAL #2   Title  Patient to report decreased pain by 25% with  standing and walking    Time  4    Period  Weeks    Status  New    Target Date  03/01/18        PT Long Term Goals - 02/01/18 1149      PT LONG TERM GOAL #1   Title  Patient able to walk in processional for choir with 75% less pain.    Status  New    Target Date  03/29/18      PT LONG TERM GOAL #2   Title  Patient able to stand in choir during church service without having to sit secondary to pain.    Time  8    Period  Weeks    Status  New      PT LONG TERM GOAL #3   Title  Patient able to ascend/descend stairs at church with minimal pain.    Time  8    Period  Weeks      PT LONG TERM GOAL #4   Title  Patient to demo 5/5 BLE strength to improve function.    Time  8    Period  Weeks    Status  New            Plan - 02/09/18 1249    Clinical Impression Statement  added wt machines and resistance with ex today and tolerated well but fatigued easily, increased pain/weakness with SLS on RT . some pain during session but painfree when left. Tightness noted BIL HS. discussed BM for carrying and lifting groceries.    PT Treatment/Interventions  ADLs/Self Care Home Management;Electrical Stimulation;Moist Heat;Therapeutic activities;Therapeutic exercise;Neuromuscular  re-education;Patient/family education;Dry needling;Taping;Balance training    PT Next Visit Plan  add lumbar stab; hip flexor/ITB flexibility and strengthening       Patient will benefit from skilled therapeutic intervention in order to improve the following deficits and impairments:  Pain, Decreased activity tolerance, Decreased strength, Impaired flexibility, Postural dysfunction  Visit Diagnosis: Chronic bilateral low back pain, with sciatica presence unspecified  Muscle weakness (generalized)     Problem List Patient Active Problem List   Diagnosis Date Noted  . On statin therapy due to risk of future cardiovascular event 12/01/2017  . Spondylolisthesis at L5-S1 level 08/13/2017  . Colon polyp 05/25/2017  . Ophthalmic migraine 05/25/2017  . Essential tremor 05/25/2017  . Allergic asthma, mild intermittent, uncomplicated 68/08/2120  . Obesity, Class I, BMI 30-34.9 04/16/2015  . Conjunctivitis, allergic, chronic 10/01/2012  . Hypertriglyceridemia 09/22/2011  . Rosacea 06/25/2011  . Irritable bowel syndrome with diarrhea 07/08/2010  . Benign essential hypertension 05/23/2010  . CKD (chronic kidney disease) stage 3, GFR 30-59 ml/min (HCC) 05/23/2010  . Chronic allergic rhinitis 02/25/2010  . GERD (gastroesophageal reflux disease) 02/25/2010  . Psoriasis 02/25/2010  . Osteoarthritis, multiple sites 06/12/2009    Aryannah Mohon,[name]  PTA 02/09/2018, 12:51 PM  [place], [name], [address] Phone: [phone]   Fax:  [phone]  Name: Dhalia Zingaro Pertuit MRN: [id number] Date of Birth: 1940-07-01

## 2018-02-11 ENCOUNTER — Ambulatory Visit: Payer: Medicare Other | Admitting: Physical Therapy

## 2018-02-11 ENCOUNTER — Encounter: Payer: Self-pay | Admitting: Physical Therapy

## 2018-02-11 DIAGNOSIS — M6281 Muscle weakness (generalized): Secondary | ICD-10-CM

## 2018-02-11 DIAGNOSIS — M545 Low back pain: Secondary | ICD-10-CM | POA: Diagnosis not present

## 2018-02-11 DIAGNOSIS — G8929 Other chronic pain: Secondary | ICD-10-CM

## 2018-02-11 NOTE — Therapy (Signed)
[place], [name], [address] Phone: [phone]   Fax:  [phone]  Physical Therapy Treatment  Patient Details  Name: Sabrina Ramirez MRN: [id number] Date of Birth: Jan 22, 1941 Referring Provider: Ferne Reus, PA-C   Encounter Date: 02/11/2018  PT End of Session - 02/11/18 1433    Visit Number  4    Number of Visits  16    Date for PT Re-Evaluation  03/29/18    PT Start Time  1400    PT Stop Time  1454    PT Time Calculation (min)  54 min       Past Medical History:  Diagnosis Date  . Allergic asthma, mild intermittent, uncomplicated 64/15/8309   Dr. Donneta Romberg, allergist  . Arthritis   . Chronic kidney disease    Stage III kidney disease  . GERD (gastroesophageal reflux disease)   . Hemorrhoids   . History of kidney stones   . Hypertension   . IBS (irritable bowel syndrome)   . Psoriasis     Past Surgical History:  Procedure Laterality Date  . ABDOMINAL HYSTERECTOMY  1987  . CATARACT EXTRACTION W/ INTRAOCULAR LENS  IMPLANT, BILATERAL Bilateral 2015  . CHOLECYSTECTOMY    . HEMORRHOID SURGERY    . SPINE SURGERY  07/10/2017  . TONSILLECTOMY      There were no vitals filed for this visit.  Subjective Assessment - 02/11/18 1401    Subjective  made 35# potatoe salad and 3 pies so hurting    Currently in Pain?  Yes    Pain Score  4     Pain Location  Back    Pain Orientation  Lower                       [facility] Adult PT Treatment/Exercise - 02/11/18 0001      Exercises   Exercises  Lumbar;Knee/Hip      Lumbar Exercises: Aerobic   Nustep  L 4 36min   discussed foot prop and carrying items for potluck     Lumbar Exercises: Machines for Strengthening   Cybex Lumbar Extension  black 2 tband 2 sets 10, plus trunk flexion for core    Cybex Knee Extension  5# 2 sets 10    Cybex Knee Flexion  15# 2 sets 10    Other Lumbar Machine Exercise  lats and rows 2 sets  10 15#      Lumbar Exercises: Standing   Other Standing Lumbar Exercises  red tband hip 3 way 15 times each      Lumbar Exercises: Seated   Sit to Stand  10 reps   wt ball   Other Seated Lumbar Exercises  core activation with ball 15      Modalities   Modalities  Moist Heat;Electrical Stimulation      Moist Heat Therapy   Number Minutes Moist Heat  15 Minutes    Moist Heat Location  Lumbar Spine      Electrical Stimulation   Electrical Stimulation Location  LB    Electrical Stimulation Action  IFC    Electrical Stimulation Parameters  supine    Electrical Stimulation Goals  Pain             PT Education - 02/11/18 1432    Education Details  BM lifting,twisting, prolonged standing and carrying obj    Person(s) Educated  Patient    Methods  Explanation;Demonstration  Comprehension  Verbalized understanding       PT Short Term Goals - 02/11/18 1435      PT SHORT TERM GOAL #1   Title  Patient independent with HEP    Status  Achieved        PT Long Term Goals - 02/01/18 1149      PT LONG TERM GOAL #1   Title  Patient able to walk in processional for choir with 75% less pain.    Status  New    Target Date  03/29/18      PT LONG TERM GOAL #2   Title  Patient able to stand in choir during church service without having to sit secondary to pain.    Time  8    Period  Weeks    Status  New      PT LONG TERM GOAL #3   Title  Patient able to ascend/descend stairs at church with minimal pain.    Time  8    Period  Weeks      PT LONG TERM GOAL #4   Title  Patient to demo 5/5 BLE strength to improve function.    Time  8    Period  Weeks    Status  New            Plan - 02/11/18 1433    Clinical Impression Statement  alittle slower with todays intervantions d/t some increase in pain. fatigued quickly with knee ext at 5#. RT LE weakness causing pain with SLS. educ on BM- but will need further educ and practice. added stim and MH today d/t increased pain  from cooking past 2 days.       Patient will benefit from skilled therapeutic intervention in order to improve the following deficits and impairments:     Visit Diagnosis: Chronic bilateral low back pain, with sciatica presence unspecified  Muscle weakness (generalized)     Problem List Patient Active Problem List   Diagnosis Date Noted  . On statin therapy due to risk of future cardiovascular event 12/01/2017  . Spondylolisthesis at L5-S1 level 08/13/2017  . Colon polyp 05/25/2017  . Ophthalmic migraine 05/25/2017  . Essential tremor 05/25/2017  . Allergic asthma, mild intermittent, uncomplicated 61/22/4497  . Obesity, Class I, BMI 30-34.9 04/16/2015  . Conjunctivitis, allergic, chronic 10/01/2012  . Hypertriglyceridemia 09/22/2011  . Rosacea 06/25/2011  . Irritable bowel syndrome with diarrhea 07/08/2010  . Benign essential hypertension 05/23/2010  . CKD (chronic kidney disease) stage 3, GFR 30-59 ml/min (HCC) 05/23/2010  . Chronic allergic rhinitis 02/25/2010  . GERD (gastroesophageal reflux disease) 02/25/2010  . Psoriasis 02/25/2010  . Osteoarthritis, multiple sites 06/12/2009    Sabrina Ramirez,[name] PTA 02/11/2018, 2:35 PM  [place], [name], [address] Phone: [phone]   Fax:  [phone]  Name: Sabrina Ramirez MRN: [id number] Date of Birth: Jan 19, 1941

## 2018-02-18 ENCOUNTER — Ambulatory Visit: Payer: Medicare Other | Admitting: Physical Therapy

## 2018-02-18 DIAGNOSIS — M6281 Muscle weakness (generalized): Secondary | ICD-10-CM

## 2018-02-18 DIAGNOSIS — M545 Low back pain: Principal | ICD-10-CM

## 2018-02-18 DIAGNOSIS — G8929 Other chronic pain: Secondary | ICD-10-CM

## 2018-02-18 NOTE — Patient Instructions (Signed)
Piriformis Stretch, Sitting PNF    Sit, one ankle on opposite knee, same-side hand on crossed knee. Push down on knee, keeping spine straight. Lean torso forward until tension is felt in hamstrings and gluteals of crossed-leg side.  Hold _30-60__ seconds. Release.  Repeat _3__ times per session. Do _2 __ sessions per [place].  Side Stretch    Lie on your back with your shoulders to the left and hips to the right. Move your straight left leg to the left and then try to bring the right leg to meet it. Stop when you feel a stretch in the right low back. Hold 1-5 min. 1-2 x/[place].  You DO NOT have to raise arm overhead as shown.  Madelyn Flavors, PT 02/18/18 9:42 AM [place] [address] [address] [place], [name], [address] Phone: [phone]   Fax:  [phone]

## 2018-02-18 NOTE — Therapy (Signed)
[place], [name], [address] Phone: [phone]   Fax:  [phone]  Physical Therapy Treatment  Patient Details  Name: Sabrina Ramirez MRN: [id number] Date of Birth: Mar 20, 1941 Referring Provider: Ferne Reus, PA-C   Encounter Date: 02/18/2018  PT End of Session - 02/18/18 0849    Visit Number  5    Number of Visits  16    Date for PT Re-Evaluation  03/29/18    Authorization Type  MCR    PT Start Time  0850    PT Stop Time  0947    PT Time Calculation (min)  57 min    Activity Tolerance  Patient tolerated treatment well    Behavior During Therapy  [facility] for tasks assessed/performed       Past Medical History:  Diagnosis Date  . Allergic asthma, mild intermittent, uncomplicated 23/55/7322   Dr. Donneta Romberg, allergist  . Arthritis   . Chronic kidney disease    Stage III kidney disease  . GERD (gastroesophageal reflux disease)   . Hemorrhoids   . History of kidney stones   . Hypertension   . IBS (irritable bowel syndrome)   . Psoriasis     Past Surgical History:  Procedure Laterality Date  . ABDOMINAL HYSTERECTOMY  1987  . CATARACT EXTRACTION W/ INTRAOCULAR LENS  IMPLANT, BILATERAL Bilateral 2015  . CHOLECYSTECTOMY    . HEMORRHOID SURGERY    . SPINE SURGERY  07/10/2017  . TONSILLECTOMY      There were no vitals filed for this visit.  Subjective Assessment - 02/18/18 0901    Subjective  Still sore in the back wiht ADLS    Pertinent History  lumbar fusion 3/19; hysterectomy    Limitations  Walking;Standing    How long can you walk comfortably?  one block    Diagnostic tests  xrays - healed well    Patient Stated Goals  be able to perform church activities without pain    Currently in Pain?  Yes    Pain Score  3    6/10 in am   Pain Location  Back    Pain Orientation  Lower    Pain Descriptors / Indicators  Aching    Pain Type  Chronic pain         [facility] PT Assessment  - 02/18/18 0001      Special Tests   Other special tests  neg hip scour and FABER Rt                   [facility] Adult PT Treatment/Exercise - 02/18/18 0001      Exercises   Exercises  Lumbar      Lumbar Exercises: Stretches   Lower Trunk Rotation  2 reps;30 seconds   to the left only   Figure 4 Stretch  1 rep;20 seconds;Supine    Other Lumbar Stretch Exercise  supine C stretch for R QL      Lumbar Exercises: Machines for Strengthening   Cybex Lumbar Extension  black 2 tband 2 sets 10, plus trunk flexion for core    Cybex Knee Extension  10# 2 sets 8   with ab set   Cybex Knee Flexion  10# 2x10    Other Lumbar Machine Exercise  lats and rows 2 sets 10 15#   with ab set     Lumbar Exercises: Standing   Other Standing Lumbar Exercises  red tband hip 3  way 15 times each      Lumbar Exercises: Seated   Sit to Stand  10 reps   with yellow band OH lift     Lumbar Exercises: Supine   Bridge  10 reps;Compliant;3 seconds   ball under feet   Other Supine Lumbar Exercises  obl 20 times      Modalities   Modalities  Electrical Stimulation;Moist Heat      Moist Heat Therapy   Number Minutes Moist Heat  15 Minutes    Moist Heat Location  Lumbar Spine      Electrical Stimulation   Electrical Stimulation Location  lumbar    Electrical Stimulation Action  IFC    Electrical Stimulation Parameters  supine    Electrical Stimulation Goals  Pain      Manual Therapy   Manual Therapy  Manual Traction    Manual Traction  long leg traction R 2x 20 seconds             PT Education - 02/18/18 0945    Education Details  HEP    Person(s) Educated  Patient    Methods  Demonstration;Explanation;Handout    Comprehension  Verbalized understanding;Returned demonstration       PT Short Term Goals - 02/18/18 0944      PT SHORT TERM GOAL #1   Title  Patient independent with HEP    Time  2    Period  Weeks    Status  Achieved      PT SHORT TERM GOAL #2   Title  Patient  to report decreased pain by 25% with standing and walking    Time  4    Period  Weeks    Status  On-going        PT Long Term Goals - 02/01/18 1149      PT LONG TERM GOAL #1   Title  Patient able to walk in processional for choir with 75% less pain.    Status  New    Target Date  03/29/18      PT LONG TERM GOAL #2   Title  Patient able to stand in choir during church service without having to sit secondary to pain.    Time  8    Period  Weeks    Status  New      PT LONG TERM GOAL #3   Title  Patient able to ascend/descend stairs at church with minimal pain.    Time  8    Period  Weeks      PT LONG TERM GOAL #4   Title  Patient to demo 5/5 BLE strength to improve function.    Time  8    Period  Weeks    Status  New            Plan - 02/18/18 0945    Clinical Impression Statement  Patient did well with TE today. She continues to report pain in R hip with SLS activities. Patient has negative hip scour and FABER on Rt. She tolerated long leg traction without complaint but continued to feel onset of pain afterward. Short and LTGs are ongoing.    PT Frequency  2x / week    PT Duration  8 weeks    PT Treatment/Interventions  ADLs/Self Care Home Management;Electrical Stimulation;Moist Heat;Therapeutic activities;Therapeutic exercise;Neuromuscular re-education;Patient/family education;Dry needling;Taping;Balance training    PT Next Visit Plan  add lumbar stab; hip flexor/ITB flexibility and strengthening    PT Home  Exercise Plan  seated HS stretch, LTR, SKTC, supine QL stretch, seated figure 4    Consulted and Agree with Plan of Care  Patient       Patient will benefit from skilled therapeutic intervention in order to improve the following deficits and impairments:  Pain, Decreased activity tolerance, Decreased strength, Impaired flexibility, Postural dysfunction  Visit Diagnosis: Chronic bilateral low back pain, with sciatica presence unspecified  Muscle weakness  (generalized)     Problem List Patient Active Problem List   Diagnosis Date Noted  . On statin therapy due to risk of future cardiovascular event 12/01/2017  . Spondylolisthesis at L5-S1 level 08/13/2017  . Colon polyp 05/25/2017  . Ophthalmic migraine 05/25/2017  . Essential tremor 05/25/2017  . Allergic asthma, mild intermittent, uncomplicated 90/30/1499  . Obesity, Class I, BMI 30-34.9 04/16/2015  . Conjunctivitis, allergic, chronic 10/01/2012  . Hypertriglyceridemia 09/22/2011  . Rosacea 06/25/2011  . Irritable bowel syndrome with diarrhea 07/08/2010  . Benign essential hypertension 05/23/2010  . CKD (chronic kidney disease) stage 3, GFR 30-59 ml/min (HCC) 05/23/2010  . Chronic allergic rhinitis 02/25/2010  . GERD (gastroesophageal reflux disease) 02/25/2010  . Psoriasis 02/25/2010  . Osteoarthritis, multiple sites 06/12/2009    Breann Losano PT 02/18/2018, 9:58 AM  [place], [name], [address] Phone: [phone]   Fax:  [phone]  Name: Ketura Sirek Magnan MRN: [id number] Date of Birth: 01-Apr-1941

## 2018-02-23 ENCOUNTER — Ambulatory Visit: Payer: Medicare Other | Admitting: Physical Therapy

## 2018-02-23 ENCOUNTER — Encounter: Payer: Self-pay | Admitting: Physical Therapy

## 2018-02-23 DIAGNOSIS — M545 Low back pain: Secondary | ICD-10-CM | POA: Diagnosis not present

## 2018-02-23 DIAGNOSIS — G8929 Other chronic pain: Secondary | ICD-10-CM

## 2018-02-23 DIAGNOSIS — M6281 Muscle weakness (generalized): Secondary | ICD-10-CM

## 2018-02-23 NOTE — Therapy (Signed)
[place], [name], [address] Phone: [phone]   Fax:  [phone]  Physical Therapy Treatment  Patient Details  Name: Sabrina Ramirez MRN: [id number] Date of Birth: 1940-11-12 Referring Provider: Ferne Reus, PA-C   Encounter Date: 02/23/2018  PT End of Session - 02/23/18 1217    Visit Number  6    Number of Visits  16    Date for PT Re-Evaluation  03/29/18    PT Start Time  0034    PT Stop Time  1240    PT Time Calculation (min)  55 min       Past Medical History:  Diagnosis Date  . Allergic asthma, mild intermittent, uncomplicated 91/79/1505   Dr. Donneta Romberg, allergist  . Arthritis   . Chronic kidney disease    Stage III kidney disease  . GERD (gastroesophageal reflux disease)   . Hemorrhoids   . History of kidney stones   . Hypertension   . IBS (irritable bowel syndrome)   . Psoriasis     Past Surgical History:  Procedure Laterality Date  . ABDOMINAL HYSTERECTOMY  1987  . CATARACT EXTRACTION W/ INTRAOCULAR LENS  IMPLANT, BILATERAL Bilateral 2015  . CHOLECYSTECTOMY    . HEMORRHOID SURGERY    . SPINE SURGERY  07/10/2017  . TONSILLECTOMY      There were no vitals filed for this visit.  Subjective Assessment - 02/23/18 1144    Subjective  doing okay more stiffness than pain currently    Currently in Pain?  Yes    Pain Score  3     Pain Location  Back    Pain Orientation  Lower                       [facility] Adult PT Treatment/Exercise - 02/23/18 0001      Exercises   Exercises  Lumbar;Knee/Hip      Lumbar Exercises: Aerobic   Nustep  L 5 7 min      Lumbar Exercises: Machines for Strengthening   Cybex Knee Extension  10# 2 sets 10    Cybex Knee Flexion  15# 2x10    Leg Press  30# 2 sets 10 with abs      Lumbar Exercises: Standing   Row  Both;15 reps   10# pulleys each side   Shoulder Extension  Both;10 reps   10# pulleys each side   Other  Standing Lumbar Exercises  resisted gait with cable pulleys   wt ball OH and obl   Other Standing Lumbar Exercises  RT hip shift at wall 2 sets 10   pt in left lat shift at hips and comp at shlds     Knee/Hip Exercises: Standing   Lateral Step Up  Both;10 reps;Hand Hold: 2;Step Height: 6";1 set   opp leg abd   Forward Step Up  Both;1 set;10 reps;Hand Hold: 2;Step Height: 6"   opp ext     Modalities   Modalities  Electrical Stimulation;Moist Heat      Moist Heat Therapy   Number Minutes Moist Heat  15 Minutes    Moist Heat Location  Lumbar Spine      Electrical Stimulation   Electrical Stimulation Location  lumbar    Electrical Stimulation Action  IFC    Electrical Stimulation Parameters  supine    Electrical Stimulation Goals  Pain  PT Short Term Goals - 02/18/18 0944      PT SHORT TERM GOAL #1   Title  Patient independent with HEP    Time  2    Period  Weeks    Status  Achieved      PT SHORT TERM GOAL #2   Title  Patient to report decreased pain by 25% with standing and walking    Time  4    Period  Weeks    Status  On-going        PT Long Term Goals - 02/01/18 1149      PT LONG TERM GOAL #1   Title  Patient able to walk in processional for choir with 75% less pain.    Status  New    Target Date  03/29/18      PT LONG TERM GOAL #2   Title  Patient able to stand in choir during church service without having to sit secondary to pain.    Time  8    Period  Weeks    Status  New      PT LONG TERM GOAL #3   Title  Patient able to ascend/descend stairs at church with minimal pain.    Time  8    Period  Weeks      PT LONG TERM GOAL #4   Title  Patient to demo 5/5 BLE strength to improve function.    Time  8    Period  Weeks    Status  New            Plan - 02/23/18 1217    Clinical Impression Statement  increased standing ex today and increased need for seated rest more for fatigue tahn pain. cuing needed fo rcorrect tech and  lumb stab    PT Treatment/Interventions  ADLs/Self Care Home Management;Electrical Stimulation;Moist Heat;Therapeutic activities;Therapeutic exercise;Neuromuscular re-education;Patient/family education;Dry needling;Taping;Balance training    PT Next Visit Plan  add lumbar stab; hip flexor/ITB flexibility and strengthening ASSESS GOALS       Patient will benefit from skilled therapeutic intervention in order to improve the following deficits and impairments:  Pain, Decreased activity tolerance, Decreased strength, Impaired flexibility, Postural dysfunction  Visit Diagnosis: Chronic bilateral low back pain, with sciatica presence unspecified  Muscle weakness (generalized)     Problem List Patient Active Problem List   Diagnosis Date Noted  . On statin therapy due to risk of future cardiovascular event 12/01/2017  . Spondylolisthesis at L5-S1 level 08/13/2017  . Colon polyp 05/25/2017  . Ophthalmic migraine 05/25/2017  . Essential tremor 05/25/2017  . Allergic asthma, mild intermittent, uncomplicated 88/41/6606  . Obesity, Class I, BMI 30-34.9 04/16/2015  . Conjunctivitis, allergic, chronic 10/01/2012  . Hypertriglyceridemia 09/22/2011  . Rosacea 06/25/2011  . Irritable bowel syndrome with diarrhea 07/08/2010  . Benign essential hypertension 05/23/2010  . CKD (chronic kidney disease) stage 3, GFR 30-59 ml/min (HCC) 05/23/2010  . Chronic allergic rhinitis 02/25/2010  . GERD (gastroesophageal reflux disease) 02/25/2010  . Psoriasis 02/25/2010  . Osteoarthritis, multiple sites 06/12/2009    Tayten Bergdoll,[name] PTA 02/23/2018, 12:18 PM  [place], [name], [address] Phone: [phone]   Fax:  [phone]  Name: Sabrina Ramirez MRN: [id number] Date of Birth: 12-17-1940

## 2018-02-25 ENCOUNTER — Encounter: Payer: Self-pay | Admitting: Family Medicine

## 2018-02-25 ENCOUNTER — Encounter: Payer: Self-pay | Admitting: Physical Therapy

## 2018-02-25 ENCOUNTER — Ambulatory Visit: Payer: Medicare Other | Admitting: Physical Therapy

## 2018-02-25 DIAGNOSIS — M6281 Muscle weakness (generalized): Secondary | ICD-10-CM

## 2018-02-25 DIAGNOSIS — M545 Low back pain: Principal | ICD-10-CM

## 2018-02-25 DIAGNOSIS — G8929 Other chronic pain: Secondary | ICD-10-CM

## 2018-02-25 NOTE — Therapy (Signed)
[place], [name], [address] Phone: [phone]   Fax:  [phone]  Physical Therapy Treatment  Patient Details  Name: Sabrina Ramirez MRN: [id number] Date of Birth: 09-30-1940 Referring Provider: Ferne Reus, PA-C   Encounter Date: 02/25/2018  PT End of Session - 02/25/18 1140    Visit Number  7    Number of Visits  16    Date for PT Re-Evaluation  03/29/18    PT Start Time  1100    PT Stop Time  1154    PT Time Calculation (min)  54 min    Activity Tolerance  Patient tolerated treatment well    Behavior During Therapy  [facility] for tasks assessed/performed       Past Medical History:  Diagnosis Date  . Allergic asthma, mild intermittent, uncomplicated 76/54/6503   Dr. Donneta Romberg, allergist  . Arthritis   . Chronic kidney disease    Stage III kidney disease  . GERD (gastroesophageal reflux disease)   . Hemorrhoids   . History of kidney stones   . Hypertension   . IBS (irritable bowel syndrome)   . Psoriasis     Past Surgical History:  Procedure Laterality Date  . ABDOMINAL HYSTERECTOMY  1987  . CATARACT EXTRACTION W/ INTRAOCULAR LENS  IMPLANT, BILATERAL Bilateral 2015  . CHOLECYSTECTOMY    . HEMORRHOID SURGERY    . SPINE SURGERY  07/10/2017  . TONSILLECTOMY      There were no vitals filed for this visit.  Subjective Assessment - 02/25/18 1101    Subjective  "Today is a awesome [place], I worked in the yard"    Currently in Pain?  No/denies   A little stiff from working un the yard   Pain Score  0-No pain                       [facility] Adult PT Treatment/Exercise - 02/25/18 0001      Lumbar Exercises: Aerobic   Nustep  L 5 6 min      Lumbar Exercises: Machines for Strengthening   Cybex Knee Extension  10lb x13 5lb x7     Cybex Knee Flexion  15# 2x10    Leg Press  30# 2 sets 10 with abs      Lumbar Exercises: Standing   Row  Theraband;20 reps;Both     Theraband Level (Row)  Level 3 (Green)    Shoulder Extension  Theraband;20 reps;Both    Theraband Level (Shoulder Extension)  Level 3 (Green)    Other Standing Lumbar Exercises  Trunk twist Wt ball 2x10       Knee/Hip Exercises: Standing   Lateral Step Up  Both;1 set;10 reps;Hand Hold: 1;Step Height: 6"    Forward Step Up  Both;1 set;10 reps;Hand Hold: 1;Step Height: 6"      Modalities   Modalities  Electrical Stimulation;Moist Heat      Moist Heat Therapy   Number Minutes Moist Heat  15 Minutes   15   Moist Heat Location  Lumbar Spine      Electrical Stimulation   Electrical Stimulation Location  lumbar    Electrical Stimulation Action  IFC    Electrical Stimulation Parameters  supine    Electrical Stimulation Goals  Pain               PT Short Term Goals - 02/18/18 0944      PT SHORT TERM  GOAL #1   Title  Patient independent with HEP    Time  2    Period  Weeks    Status  Achieved      PT SHORT TERM GOAL #2   Title  Patient to report decreased pain by 25% with standing and walking    Time  4    Period  Weeks    Status  On-going        PT Long Term Goals - 02/25/18 1147      PT LONG TERM GOAL #1   Title  Patient able to walk in processional for choir with 75% less pain.    Status  On-going      PT LONG TERM GOAL #2   Title  Patient able to stand in choir during church service without having to sit secondary to pain.    Status  On-going      PT LONG TERM GOAL #3   Status  Partially Met            Plan - 02/25/18 1148    Clinical Impression Statement  Pt continues to have increase fatigue with exercises requiring multiple rest breaks. No reports of increase pain only muscle burning in the leg with exercises. Postural cues needed with standing rows and extensions.     Rehab Potential  Excellent    PT Frequency  2x / week    PT Duration  8 weeks    PT Next Visit Plan  add lumbar stab; hip flexor/ITB flexibility and strengthening        Patient will benefit from skilled therapeutic intervention in order to improve the following deficits and impairments:  Pain, Decreased activity tolerance, Decreased strength, Impaired flexibility, Postural dysfunction  Visit Diagnosis: Chronic bilateral low back pain, with sciatica presence unspecified  Muscle weakness (generalized)     Problem List Patient Active Problem List   Diagnosis Date Noted  . On statin therapy due to risk of future cardiovascular event 12/01/2017  . Spondylolisthesis at L5-S1 level 08/13/2017  . Colon polyp 05/25/2017  . Ophthalmic migraine 05/25/2017  . Essential tremor 05/25/2017  . Allergic asthma, mild intermittent, uncomplicated 98/33/8250  . Obesity, Class I, BMI 30-34.9 04/16/2015  . Conjunctivitis, allergic, chronic 10/01/2012  . Hypertriglyceridemia 09/22/2011  . Rosacea 06/25/2011  . Irritable bowel syndrome with diarrhea 07/08/2010  . Benign essential hypertension 05/23/2010  . CKD (chronic kidney disease) stage 3, GFR 30-59 ml/min (HCC) 05/23/2010  . Chronic allergic rhinitis 02/25/2010  . GERD (gastroesophageal reflux disease) 02/25/2010  . Psoriasis 02/25/2010  . Osteoarthritis, multiple sites 06/12/2009    Scot Jun, PTA 02/25/2018, 11:50 AM  [place], [name], [address] Phone: [phone]   Fax:  [phone]  Name: Lillie Portner Beighley MRN: [id number] Date of Birth: 05-30-1941

## 2018-03-01 ENCOUNTER — Other Ambulatory Visit: Payer: Self-pay

## 2018-03-01 ENCOUNTER — Encounter: Payer: Self-pay | Admitting: Family Medicine

## 2018-03-01 ENCOUNTER — Ambulatory Visit (INDEPENDENT_AMBULATORY_CARE_PROVIDER_SITE_OTHER): Payer: Medicare Other | Admitting: Family Medicine

## 2018-03-01 VITALS — BP 136/78 | HR 58 | Temp 97.7°F | Ht 63.5 in | Wt 196.4 lb

## 2018-03-01 DIAGNOSIS — E781 Pure hyperglyceridemia: Secondary | ICD-10-CM | POA: Diagnosis not present

## 2018-03-01 DIAGNOSIS — Z23 Encounter for immunization: Secondary | ICD-10-CM | POA: Diagnosis not present

## 2018-03-01 DIAGNOSIS — E2839 Other primary ovarian failure: Secondary | ICD-10-CM

## 2018-03-01 DIAGNOSIS — I10 Essential (primary) hypertension: Secondary | ICD-10-CM | POA: Diagnosis not present

## 2018-03-01 DIAGNOSIS — N183 Chronic kidney disease, stage 3 unspecified: Secondary | ICD-10-CM

## 2018-03-01 DIAGNOSIS — K580 Irritable bowel syndrome with diarrhea: Secondary | ICD-10-CM

## 2018-03-01 DIAGNOSIS — Z0000 Encounter for general adult medical examination without abnormal findings: Secondary | ICD-10-CM

## 2018-03-01 LAB — COMPREHENSIVE METABOLIC PANEL
ALK PHOS: 86 U/L (ref 39–117)
ALT: 15 U/L (ref 0–35)
AST: 17 U/L (ref 0–37)
Albumin: 4.2 g/dL (ref 3.5–5.2)
BILIRUBIN TOTAL: 0.6 mg/dL (ref 0.2–1.2)
BUN: 38 mg/dL — AB (ref 6–23)
CO2: 26 mEq/L (ref 19–32)
Calcium: 10 mg/dL (ref 8.4–10.5)
Chloride: 106 mEq/L (ref 96–112)
Creatinine, Ser: 1.67 mg/dL — ABNORMAL HIGH (ref 0.40–1.20)
GFR: 31.63 mL/min — AB (ref >60.00)
Glucose, Bld: 111 mg/dL — ABNORMAL HIGH (ref 70–99)
Potassium: 4.2 mEq/L (ref 3.5–5.1)
SODIUM: 140 meq/L (ref 135–145)
TOTAL PROTEIN: 7.3 g/dL (ref 6.0–8.3)

## 2018-03-01 LAB — POCT URINALYSIS DIPSTICK
Bilirubin, UA: NEGATIVE
Blood, UA: NEGATIVE
Glucose, UA: NEGATIVE
KETONES UA: NEGATIVE
Leukocytes, UA: NEGATIVE
NITRITE UA: NEGATIVE
PH UA: 5.5 (ref 5.0–8.0)
Protein, UA: POSITIVE — AB
SPEC GRAV UA: 1.02 (ref 1.010–1.025)
UROBILINOGEN UA: 0.2 U/dL

## 2018-03-01 LAB — CBC WITH DIFFERENTIAL/PLATELET
BASOS PCT: 0.7 % (ref 0.0–3.0)
Basophils Absolute: 0.1 10*3/uL (ref 0.0–0.1)
EOS ABS: 0.3 10*3/uL (ref 0.0–0.7)
EOS PCT: 4.4 % (ref 0.0–5.0)
HEMATOCRIT: 38.2 % (ref 36.0–46.0)
Hemoglobin: 12.8 g/dL (ref 12.0–15.0)
Lymphocytes Relative: 30.4 % (ref 12.0–46.0)
Lymphs Abs: 2.3 10*3/uL (ref 0.7–4.0)
MCHC: 33.4 g/dL (ref 30.0–36.0)
MCV: 93.5 fl (ref 78.0–100.0)
MONO ABS: 0.7 10*3/uL (ref 0.1–1.0)
Monocytes Relative: 8.7 % (ref 3.0–12.0)
NEUTROS ABS: 4.2 10*3/uL (ref 1.4–7.7)
Neutrophils Relative %: 55.8 % (ref 43.0–77.0)
Platelets: 205 10*3/uL (ref 150.0–400.0)
RBC: 4.09 Mil/uL (ref 3.87–5.11)
RDW: 13.5 % (ref 11.5–15.5)
WBC: 7.6 10*3/uL (ref 4.0–10.5)

## 2018-03-01 LAB — LIPID PANEL
Cholesterol: 136 mg/dL (ref 0–200)
HDL: 49.2 mg/dL (ref >39.00)
LDL Cholesterol: 52 mg/dL (ref 0–99)
NONHDL: 87.1
Total CHOL/HDL Ratio: 3
Triglycerides: 178 mg/dL — ABNORMAL HIGH (ref 0.0–149.0)
VLDL: 35.6 mg/dL (ref 0.0–40.0)

## 2018-03-01 LAB — TSH: TSH: 1.89 u[IU]/mL (ref 0.35–4.50)

## 2018-03-01 NOTE — Patient Instructions (Addendum)
Please return in 6 months for follow up of your hypertension. Please get your eyes examined and get your 2nd shingrix vaccination at your convenience.  I will release your lab results to you on your MyChart account with further instructions. Please reply with any questions.   If you have any questions or concerns, please don't hesitate to send me a message via MyChart or call the office at [phone]. Thank you for visiting with [place] today! It's our pleasure caring for you.  Please do these things to maintain good health!   Exercise at least 30-45 minutes a [place],  4-5 days a week.   Eat a low-fat diet with lots of fruits and vegetables, up to 7-9 servings per [place].  Drink plenty of water daily. Try to drink 8 8oz glasses per [place].  Seatbelts can save your life. Always wear your seatbelt.  Place Smoke Detectors on every level of your home and check batteries every year.  Schedule an appointment with an eye doctor for an eye exam every 1-2 years  Safe sex - use condoms to protect yourself from STDs if you could be exposed to these types of infections. Use birth control if you do not want to become pregnant and are sexually active.  Avoid heavy alcohol use. If you drink, keep it to less than 2 drinks/[place] and not every [place].  [place].  Choose someone you trust that could speak for you if you became unable to speak for yourself.  Depression is common in our stressful world.If you're feeling down or losing interest in things you normally enjoy, please come in for a visit.  If anyone is threatening or hurting you, please get help. Physical or Emotional Violence is never OK.

## 2018-03-01 NOTE — Progress Notes (Signed)
Subjective  Chief Complaint  Patient presents with  . Annual Exam    patient is fasting, no complaints, she is doing well with physical therapy after spinal surgery, would like flu shot today     HPI: Sabrina Ramirez is a 77 y.o. female who presents to [place] [facility] today for a Female Wellness Visit. She also has the concerns and/or needs as listed above in the chief complaint. These will be addressed in addition to the Health Maintenance Visit.   Wellness Visit: annual visit with health maintenance review and exam without Pap   Sabrina Ramirez is doing very well.  77 year old with hypertension hypertriglyceridemia and other problems listed in the problem list here for annual visit.  Health maintenance: Due for DEXA.  Last was normal in 2014.  Mammogram is up-to-date.  Due for eye exam.  Due for second shingles vaccination and flu shot today.  Colonoscopy is up-to-date.  Last 2012 from digestive health specialist Chronic disease f/u and/or acute problem visit: (deemed necessary to be done in addition to the wellness visit):  Hypertension is well controlled on current medications without adverse effects.  No chest pain or symptoms of angina.  No heart failure symptoms.  Weight is stable  Chronic kidney disease: No symptoms of volume overload.  Hyperlipidemia: Due for recheck.  Had lumbar spinal surgery in February and is recovering well.  Currently doing physical therapy and is improving.  Irritable bowel syndrome: Mildly active.  Intermittently will discharge because of diarrhea.  No significant pain.  Relates symptoms mostly to stress or food but they are not consistent and she reports they are few and far between.  No need for medication at this time.  No blood in the stool.  Assessment  1. Annual physical exam   2. Benign essential hypertension   3. CKD (chronic kidney disease) stage 3, GFR 30-59 ml/min (HCC)   4. Hypertriglyceridemia   5. Estrogen  deficiency   6. Irritable bowel syndrome with diarrhea      Plan  Female Wellness Visit:  Age appropriate Health Maintenance and Prevention measures were discussed with patient. Included topics are cancer screening recommendations, ways to keep healthy (see AVS) including dietary and exercise recommendations, regular eye and dental care, use of seat belts, and avoidance of moderate alcohol use and tobacco use.  Recheck bone density screen today.  BMI: discussed patient's BMI and encouraged positive lifestyle modifications to help get to or maintain a target BMI.  HM needs and immunizations were addressed and ordered. See below for orders. See HM and immunization section for updates.  Flu shot today.  Patient will get Shingrix at pharmacy  Routine labs and screening tests ordered including cmp, cbc and lipids where appropriate.  Discussed recommendations regarding Vit D and calcium supplementation (see AVS)  Chronic disease management visit and/or acute problem visit:  Hypertension and hypertriglyceridemia have been well controlled.  Recheck labs today.  Continue current medications  IBS: Education given.  Due to rare symptoms will monitor for now.  If worsening, consider medication therapy.  Chronic kidney disease: Stable clinically.  Recheck levels.  Avoid NSAIDs.  Allergy and asthma is well controlled I will about allergy. Follow up: Return in about 6 months (around 08/30/2018) for follow up Hypertension.  Orders Placed This Encounter  Procedures  . DG Bone Density  . CBC with Differential/Platelet  . Comprehensive metabolic panel  . Lipid panel  . TSH  . POCT urinalysis dipstick   No orders  of the defined types were placed in this encounter.    Lifestyle: Body mass index is 34.24 kg/m. Wt Readings from Last 3 Encounters:  03/01/18 196 lb 6.4 oz (89.1 kg)  12/01/17 191 lb 6.4 oz (86.8 kg)  10/28/17 192 lb (87.1 kg)   Diet: general Exercise: never,   Patient Active  Problem List   Diagnosis Date Noted  . Obesity, Class I, BMI 30-34.9 04/16/2015    Priority: High  . Hypertriglyceridemia 09/22/2011    Priority: High  . Benign essential hypertension 05/23/2010    Priority: High  . CKD (chronic kidney disease) stage 3, GFR 30-59 ml/min (HCC) 05/23/2010    Priority: High    Overview:  Nl SPEP,UPEP and mild cortical thinning on renal ultrasound 2014   . On statin therapy due to risk of future cardiovascular event 12/01/2017    Priority: Medium  . Spondylolisthesis at L5-S1 level 08/13/2017    Priority: Medium  . Colon polyp 05/25/2017    Priority: Medium    Overview:  1968, none on subsequent colonoscopy, [facility], last 2012   . Ophthalmic migraine 05/25/2017    Priority: Medium  . Essential tremor 05/25/2017    Priority: Medium  . Allergic asthma, mild intermittent, uncomplicated 88/50/2774    Priority: Medium    Dr. Donneta Romberg, allergist   . Irritable bowel syndrome with diarrhea 07/08/2010    Priority: Medium  . Chronic allergic rhinitis 02/25/2010    Priority: Medium  . GERD (gastroesophageal reflux disease) 02/25/2010    Priority: Medium    Overview:  On chronic PPI since early 2000s. Neg H.pylor   . Osteoarthritis, multiple sites 06/12/2009    Priority: Medium  . Conjunctivitis, allergic, chronic 10/01/2012    Priority: Low  . Rosacea 06/25/2011    Priority: Low  . Psoriasis 02/25/2010    Priority: Low   Health Maintenance  Topic Date Due  . DEXA SCAN  07/05/2014  . INFLUENZA VACCINE  01/07/2018  . TETANUS/TDAP  05/23/2020  . PNA vac Low Risk Adult  Completed   Immunization History  Administered Date(s) Administered  . Influenza, High Dose Seasonal PF 03/02/2012, 04/06/2013, 04/21/2014, 04/16/2015, 03/06/2016, 04/09/2017  . Influenza-Unspecified 04/12/2011  . Pneumococcal Conjugate-13 10/05/2014  . Pneumococcal Polysaccharide-23 04/23/2010  . Pneumococcal-Unspecified 04/23/2010  . Tdap 05/23/2010  . Zoster 06/09/2004  .  Zoster Recombinat (Shingrix) 11/18/2017   We updated and reviewed the patient's past history in detail and it is documented below. Allergies: Patient is allergic to amlodipine; flexeril [cyclobenzaprine]; ace inhibitors; adhesive [tape]; angiotensin receptor blockers; corn-containing products; iodine; and triamterene-hctz. Past Medical History Patient  has a past medical history of Allergic asthma, mild intermittent, uncomplicated (12/87/8676), Arthritis, Chronic kidney disease, GERD (gastroesophageal reflux disease), Hemorrhoids, History of kidney stones, Hypertension, IBS (irritable bowel syndrome), and Psoriasis. Past Surgical History Patient  has a past surgical history that includes Cholecystectomy; Hemorrhoid surgery; Cataract extraction w/ intraocular lens  implant, bilateral (Bilateral, 2015); Abdominal hysterectomy (1987); Tonsillectomy; and Spine surgery (07/10/2017). Family History: Patient family history includes Arthritis in her maternal aunt; COPD in her maternal aunt and maternal grandmother; Deep vein thrombosis in her maternal grandfather and paternal grandmother; Diabetes in her father, paternal uncle, and sister; Heart attack in her father; Heart disease in her father, maternal grandmother, and paternal uncle; Hyperlipidemia in her father; Hypertension in her daughter, father, maternal aunt, paternal grandmother, and sister; Stroke in her father, maternal grandfather, mother, and paternal uncle; Transient ischemic attack in her sister. Social History:  Patient  reports  that she has never smoked. She has never used smokeless tobacco. She reports that she drinks alcohol. She reports that she does not use drugs.  Review of Systems: Constitutional: negative for fever or malaise Ophthalmic: negative for photophobia, double vision or loss of vision Cardiovascular: negative for chest pain, dyspnea on exertion, or new LE swelling Respiratory: negative for SOB or persistent  cough Gastrointestinal: negative for abdominal pain, change in bowel habits or melena Genitourinary: negative for dysuria or gross hematuria, no abnormal uterine bleeding or disharge Musculoskeletal: negative for new gait disturbance or muscular weakness Integumentary: negative for new or persistent rashes, no breast lumps Neurological: negative for TIA or stroke symptoms Psychiatric: negative for SI or delusions Allergic/Immunologic: negative for hives  Patient Care Team    Relationship Specialty Notifications Start End  Leamon Arnt, MD PCP - General Family Medicine  05/25/17   Mosetta Anis, MD Referring Physician Allergy  05/25/17   Consuella Lose, MD Consulting Physician Neurosurgery  10/28/17   Mottinger, Sharyn Lull  Dentistry  10/28/17    BP Readings from Last 3 Encounters:  03/01/18 136/78  12/01/17 120/80  10/28/17 138/70    Objective  Vitals: BP 136/78   Pulse (!) 58   Temp 97.7 F (36.5 C)   Ht 5' 3.5" (1.613 m)   Wt 196 lb 6.4 oz (89.1 kg)   SpO2 98%   BMI 34.24 kg/m  General:  Well developed, well nourished, no acute distress  Psych:  Alert and orientedx3,normal mood and affect HEENT:  Normocephalic, atraumatic, non-icteric sclera, PERRL, oropharynx is clear without mass or exudate, supple neck without adenopathy, mass or thyromegaly Cardiovascular:  Normal S1, S2, RRR without gallop, rub or murmur, nondisplaced PMI Respiratory:  Good breath sounds bilaterally, CTAB with normal respiratory effort Gastrointestinal: normal bowel sounds, soft, non-tender, no noted masses. No HSM MSK: no deformities, contusions. Joints are without erythema or swelling. Spine and CVA region are nontender Skin:  Warm, no rashes or suspicious lesions noted Neurologic:    Mental status is normal. CN 2-11 are normal. Gross motor and sensory exams are normal. Normal gait. No tremor Breast Exam: No mass, skin retraction or nipple discharge is appreciated in either breast. No axillary  adenopathy. Fibrocystic changes are not noted   No visits with results within 1 Plascencia(s) from this visit.  Latest known visit with results is:  Office Visit on 12/01/2017  Component Date Value Ref Range Status  . Sodium 12/01/2017 141  135 - 145 mEq/L Final  . Potassium 12/01/2017 4.4  3.5 - 5.1 mEq/L Final  . Chloride 12/01/2017 104  96 - 112 mEq/L Final  . CO2 12/01/2017 28  19 - 32 mEq/L Final  . Glucose, Bld 12/01/2017 117* 70 - 99 mg/dL Final  . BUN 12/01/2017 32* 6 - 23 mg/dL Final  . Creatinine, Ser 12/01/2017 1.72* 0.40 - 1.20 mg/dL Final  . Calcium 12/01/2017 9.6  8.4 - 10.5 mg/dL Final  . GFR 12/01/2017 30.60* >60.00 mL/min Final  . Cholesterol 12/01/2017 190  0 - 200 mg/dL Final  . Triglycerides 12/01/2017 158.0* 0.0 - 149.0 mg/dL Final  . HDL 12/01/2017 48.10  >39.00 mg/dL Final  . VLDL 12/01/2017 31.6  0.0 - 40.0 mg/dL Final  . LDL Cholesterol 12/01/2017 111* 0 - 99 mg/dL Final  . Total CHOL/HDL Ratio 12/01/2017 4   Final  . NonHDL 12/01/2017 142.19   Final     Commons side effects, risks, benefits, and alternatives for medications and treatment plan prescribed today  were discussed, and the patient expressed understanding of the given instructions. Patient is instructed to call or message via MyChart if he/she has any questions or concerns regarding our treatment plan. No barriers to understanding were identified. We discussed Red Flag symptoms and signs in detail. Patient expressed understanding regarding what to do in case of urgent or emergency type symptoms.   Medication list was reconciled, printed and provided to the patient in AVS. Patient instructions and summary information was reviewed with the patient as documented in the AVS. This note was prepared with assistance of Dragon voice recognition software. Occasional wrong-word or sound-a-like substitutions may have occurred due to the inherent limitations of voice recognition software

## 2018-03-01 NOTE — Addendum Note (Signed)
Addended bySigurd Sos on: 03/01/2018 09:20 AM   Modules accepted: Orders

## 2018-03-02 ENCOUNTER — Ambulatory Visit: Payer: Medicare Other | Admitting: Physical Therapy

## 2018-03-02 DIAGNOSIS — M545 Low back pain: Secondary | ICD-10-CM | POA: Diagnosis not present

## 2018-03-02 DIAGNOSIS — G8929 Other chronic pain: Secondary | ICD-10-CM

## 2018-03-02 DIAGNOSIS — M6281 Muscle weakness (generalized): Secondary | ICD-10-CM

## 2018-03-02 NOTE — Therapy (Signed)
[place], [name], [address] Phone: [phone]   Fax:  [phone]  Physical Therapy Treatment  Patient Details  Name: Sabrina Ramirez MRN: [id number] Date of Birth: 04/30/1941 Referring Provider: Ferne Reus, PA-C   Encounter Date: 03/02/2018  PT End of Session - 03/02/18 1215    Visit Number  8    Date for PT Re-Evaluation  03/29/18    PT Start Time  1155    PT Stop Time  1245    PT Time Calculation (min)  50 min       Past Medical History:  Diagnosis Date  . Allergic asthma, mild intermittent, uncomplicated 27/25/3664   Dr. Donneta Romberg, allergist  . Arthritis   . Chronic kidney disease    Stage III kidney disease  . GERD (gastroesophageal reflux disease)   . Hemorrhoids   . History of kidney stones   . Hypertension   . IBS (irritable bowel syndrome)   . Psoriasis     Past Surgical History:  Procedure Laterality Date  . ABDOMINAL HYSTERECTOMY  1987  . CATARACT EXTRACTION W/ INTRAOCULAR LENS  IMPLANT, BILATERAL Bilateral 2015  . CHOLECYSTECTOMY    . HEMORRHOID SURGERY    . SPINE SURGERY  07/10/2017  . TONSILLECTOMY      There were no vitals filed for this visit.  Subjective Assessment - 03/02/18 1157    Subjective  10 min late. " i can say no pain,seeing improvement"    Currently in Pain?  No/denies                       [facility] Adult PT Treatment/Exercise - 03/02/18 0001      Lumbar Exercises: Aerobic   Nustep  L 5 6 min      Lumbar Exercises: Machines for Strengthening   Cybex Lumbar Extension  standing green tband 2 sets 10    Cybex Knee Extension  10# 2 sets 10    Cybex Knee Flexion  15# 2x10    Leg Press  30# 2 sets 10 with abs engaged      Lumbar Exercises: Standing   Row  Theraband;Both;15 reps    Theraband Level (Row)  Level 3 (Green)    Shoulder Extension  Theraband;Both;15 reps    Theraband Level (Shoulder Extension)  Level 3 (Green)    Other Standing Lumbar Exercises  Trunk twist green tabnd 15 each   3# mod dead lift 2 sets 10     Lumbar Exercises: Seated   Sit to Stand  10 reps   wt ball chest press   Other Seated Lumbar Exercises  core activation with ball 15      Moist Heat Therapy   Number Minutes Moist Heat  15 Minutes    Moist Heat Location  Lumbar Spine      Electrical Stimulation   Electrical Stimulation Location  lumbar    Electrical Stimulation Action  IFC    Electrical Stimulation Parameters  supine    Electrical Stimulation Goals  Pain               PT Short Term Goals - 02/18/18 0944      PT SHORT TERM GOAL #1   Title  Patient independent with HEP    Time  2    Period  Weeks    Status  Achieved      PT SHORT TERM GOAL #2   Title  Patient to report decreased pain by 25% with standing and walking    Time  4    Period  Weeks    Status  On-going        PT Long Term Goals - 02/25/18 1147      PT LONG TERM GOAL #1   Title  Patient able to walk in processional for choir with 75% less pain.    Status  On-going      PT LONG TERM GOAL #2   Title  Patient able to stand in choir during church service without having to sit secondary to pain.    Status  On-going      PT LONG TERM GOAL #3   Status  Partially Met            Plan - 03/02/18 1215    Clinical Impression Statement  30 min of ex with only 1 rest break at 24 min- increased tolerance to exercise/activity. fatigue noted in quads towards end of session. tolerated increased standing ex    PT Treatment/Interventions  ADLs/Self Care Home Management;Electrical Stimulation;Moist Heat;Therapeutic activities;Therapeutic exercise;Neuromuscular re-education;Patient/family education;Dry needling;Taping;Balance training    PT Next Visit Plan  add lumbar stab; hip flexor/ITB flexibility and strengthening       Patient will benefit from skilled therapeutic intervention in order to improve the following deficits and impairments:   Pain, Decreased activity tolerance, Decreased strength, Impaired flexibility, Postural dysfunction  Visit Diagnosis: Chronic bilateral low back pain, with sciatica presence unspecified  Muscle weakness (generalized)     Problem List Patient Active Problem List   Diagnosis Date Noted  . On statin therapy due to risk of future cardiovascular event 12/01/2017  . Spondylolisthesis at L5-S1 level 08/13/2017  . Colon polyp 05/25/2017  . Ophthalmic migraine 05/25/2017  . Essential tremor 05/25/2017  . Allergic asthma, mild intermittent, uncomplicated 93/23/5573  . Obesity, Class I, BMI 30-34.9 04/16/2015  . Conjunctivitis, allergic, chronic 10/01/2012  . Hypertriglyceridemia 09/22/2011  . Rosacea 06/25/2011  . Irritable bowel syndrome with diarrhea 07/08/2010  . Benign essential hypertension 05/23/2010  . CKD (chronic kidney disease) stage 3, GFR 30-59 ml/min (HCC) 05/23/2010  . Chronic allergic rhinitis 02/25/2010  . GERD (gastroesophageal reflux disease) 02/25/2010  . Psoriasis 02/25/2010  . Osteoarthritis, multiple sites 06/12/2009    PAYSEUR,[name] PTA 03/02/2018, 12:25 PM  [place], [name], [address] Phone: [phone]   Fax:  [phone]  Name: Sabrina Ramirez MRN: [id number] Date of Birth: 08-27-1940

## 2018-03-04 ENCOUNTER — Encounter: Payer: Self-pay | Admitting: Physical Therapy

## 2018-03-04 ENCOUNTER — Ambulatory Visit: Payer: Medicare Other | Admitting: Physical Therapy

## 2018-03-04 DIAGNOSIS — M6281 Muscle weakness (generalized): Secondary | ICD-10-CM

## 2018-03-04 DIAGNOSIS — M545 Low back pain: Secondary | ICD-10-CM | POA: Diagnosis not present

## 2018-03-04 DIAGNOSIS — G8929 Other chronic pain: Secondary | ICD-10-CM

## 2018-03-04 NOTE — Therapy (Signed)
[address] [place], [name], [address] Phone: [phone]   Fax:  [phone]  Physical Therapy Treatment  Patient Details  Name: Sabrina Ramirez MRN: [id number] Date of Birth: 1940-10-30 Referring Provider (PT): Ferne Reus, [place]   Encounter Date: 03/04/2018  PT End of Session - 03/04/18 1218    Visit Number  9    PT Start Time  1150    PT Stop Time  1233    PT Time Calculation (min)  43 min    Activity Tolerance  Patient tolerated treatment well    Behavior During Therapy  [facility] for tasks assessed/performed       Past Medical History:  Diagnosis Date  . Allergic asthma, mild intermittent, uncomplicated 43/83/8184   Dr. Donneta Romberg, allergist  . Arthritis   . Chronic kidney disease    Stage III kidney disease  . GERD (gastroesophageal reflux disease)   . Hemorrhoids   . History of kidney stones   . Hypertension   . IBS (irritable bowel syndrome)   . Psoriasis     Past Surgical History:  Procedure Laterality Date  . ABDOMINAL HYSTERECTOMY  1987  . CATARACT EXTRACTION W/ INTRAOCULAR LENS  IMPLANT, BILATERAL Bilateral 2015  . CHOLECYSTECTOMY    . HEMORRHOID SURGERY    . SPINE SURGERY  07/10/2017  . TONSILLECTOMY      There were no vitals filed for this visit.  Subjective Assessment - 03/04/18 1152    Subjective  Pt reports getting a flu shot Monday and was experiencing some symptoms yesterday. Back is a lot better    Currently in Pain?  No/denies    Pain Score  0-No pain                       [facility] Adult PT Treatment/Exercise - 03/04/18 0001      Lumbar Exercises: Aerobic   Nustep  L 5 6 min      Lumbar Exercises: Machines for Strengthening   Cybex Knee Extension  5# 2 sets 10    Cybex Knee Flexion  10# 2x10    Leg Press  30# 2 sets 10 with abs engaged      Lumbar Exercises: Standing   Row  Theraband;Both;15 reps    Theraband Level (Row)  Level 3 (Green)    Shoulder Extension  Theraband;Both;15 reps    Theraband Level (Shoulder Extension)  Level 3 (Green)    Other Standing Lumbar Exercises  Trunk twist with yellow ball x10      Lumbar Exercises: Seated   Sit to Stand  10 reps   x2 second set with yelllow ball    Other Seated Lumbar Exercises  core activation with ball 15      Moist Heat Therapy   Number Minutes Moist Heat  15 Minutes    Moist Heat Location  Lumbar Spine      Electrical Stimulation   Electrical Stimulation Location  lumbar    Electrical Stimulation Action  IFC    Electrical Stimulation Parameters  supine    Electrical Stimulation Goals  Pain               PT Short Term Goals - 02/18/18 0944      PT SHORT TERM GOAL #1   Title  Patient independent with HEP    Time  2    Period  Weeks    Status  Achieved  PT SHORT TERM GOAL #2   Title  Patient to report decreased pain by 25% with standing and walking    Time  4    Period  Weeks    Status  On-going        PT Long Term Goals - 02/25/18 1147      PT LONG TERM GOAL #1   Title  Patient able to walk in processional for choir with 75% less pain.    Status  On-going      PT LONG TERM GOAL #2   Title  Patient able to stand in choir during church service without having to sit secondary to pain.    Status  On-going      PT LONG TERM GOAL #3   Status  Partially Met            Plan - 03/04/18 1223    Clinical Impression Statement  Pt ~ 5 minutes late for today's session, she completed all of the exercises but had to have a decrease load with curls and extensions due to LE fatigue. Good ROM with trunk rotations. Some fatigue after leg press.    Rehab Potential  Excellent    PT Treatment/Interventions  ADLs/Self Care Home Management;Electrical Stimulation;Moist Heat;Therapeutic activities;Therapeutic exercise;Neuromuscular re-education;Patient/family education;Dry needling;Taping;Balance training    PT Next Visit Plan  add lumbar stab; hip  flexor/ITB flexibility and strengthening       Patient will benefit from skilled therapeutic intervention in order to improve the following deficits and impairments:  Pain, Decreased activity tolerance, Decreased strength, Impaired flexibility, Postural dysfunction  Visit Diagnosis: Muscle weakness (generalized)  Chronic bilateral low back pain, with sciatica presence unspecified     Problem List Patient Active Problem List   Diagnosis Date Noted  . On statin therapy due to risk of future cardiovascular event 12/01/2017  . Spondylolisthesis at L5-S1 level 08/13/2017  . Colon polyp 05/25/2017  . Ophthalmic migraine 05/25/2017  . Essential tremor 05/25/2017  . Allergic asthma, mild intermittent, uncomplicated 00/97/9499  . Obesity, Class I, BMI 30-34.9 04/16/2015  . Conjunctivitis, allergic, chronic 10/01/2012  . Hypertriglyceridemia 09/22/2011  . Rosacea 06/25/2011  . Irritable bowel syndrome with diarrhea 07/08/2010  . Benign essential hypertension 05/23/2010  . CKD (chronic kidney disease) stage 3, GFR 30-59 ml/min (HCC) 05/23/2010  . Chronic allergic rhinitis 02/25/2010  . GERD (gastroesophageal reflux disease) 02/25/2010  . Psoriasis 02/25/2010  . Osteoarthritis, multiple sites 06/12/2009    Scot Jun, PTA 03/04/2018, 12:27 PM  [place], [name], [address] Phone: [phone]   Fax:  [phone]  Name: Sabrina Ramirez MRN: [id number] Date of Birth: [birth year]

## 2018-03-09 ENCOUNTER — Encounter: Payer: Self-pay | Admitting: Physical Therapy

## 2018-03-09 ENCOUNTER — Ambulatory Visit: Payer: Medicare Other | Attending: Orthopedic Surgery | Admitting: Physical Therapy

## 2018-03-09 DIAGNOSIS — M6281 Muscle weakness (generalized): Secondary | ICD-10-CM

## 2018-03-09 NOTE — Therapy (Addendum)
[address] [place], [name], [address] Phone: [phone]   Fax:  [phone] Progress Note Reporting Period 02/01/18 to 03/09/18 for the first 10 visits  See note below for Objective Data and Assessment of Progress/Goals.      Physical Therapy Treatment  Patient Details  Name: Sabrina Ramirez MRN: [id number] Date of Birth: 07-30-1940 Referring Provider (PT): Ferne Reus, [place]   Encounter Date: 03/09/2018  PT End of Session - 03/09/18 1555    Visit Number  10    Date for PT Re-Evaluation  03/29/18    PT Start Time  9169    PT Stop Time  1609    PT Time Calculation (min)  54 min    Activity Tolerance  Patient tolerated treatment well    Behavior During Therapy  [facility] for tasks assessed/performed       Past Medical History:  Diagnosis Date  . Allergic asthma, mild intermittent, uncomplicated 45/08/8880   Dr. Donneta Romberg, allergist  . Arthritis   . Chronic kidney disease    Stage III kidney disease  . GERD (gastroesophageal reflux disease)   . Hemorrhoids   . History of kidney stones   . Hypertension   . IBS (irritable bowel syndrome)   . Psoriasis     Past Surgical History:  Procedure Laterality Date  . ABDOMINAL HYSTERECTOMY  1987  . CATARACT EXTRACTION W/ INTRAOCULAR LENS  IMPLANT, BILATERAL Bilateral 2015  . CHOLECYSTECTOMY    . HEMORRHOID SURGERY    . SPINE SURGERY  07/10/2017  . TONSILLECTOMY      There were no vitals filed for this visit.  Subjective Assessment - 03/09/18 1519    Subjective  Pt reports some low back stiffness from moving things around tin her home.    Currently in Pain?  No/denies                       [facility] Adult PT Treatment/Exercise - 03/09/18 0001      Lumbar Exercises: Stretches   Passive Hamstring Stretch  4 reps;10 seconds    Lower Trunk Rotation  2 reps;30 seconds      Lumbar Exercises: Aerobic   Nustep  L 4 7 min      Lumbar  Exercises: Machines for Strengthening   Cybex Knee Extension  5# 2 sets 10    Cybex Knee Flexion  20# 2x10    Leg Press  30# 2 sets 10 with abs engaged      Lumbar Exercises: Standing   Row  Theraband;Both;15 reps    Theraband Level (Row)  Level 3 (Green)    Shoulder Extension  Theraband;Both;15 reps    Theraband Level (Shoulder Extension)  Level 3 (Green)      Lumbar Exercises: Seated   Sit to Stand  10 reps   x2 from blue chair holding yellow ball    Other Seated Lumbar Exercises  core activation with ball 15      Moist Heat Therapy   Number Minutes Moist Heat  15 Minutes    Moist Heat Location  Lumbar Spine      Electrical Stimulation   Electrical Stimulation Location  lumbar    Electrical Stimulation Action  IFC    Electrical Stimulation Parameters  supine    Electrical Stimulation Goals  Pain               PT Short Term Goals - 02/18/18 8003  PT SHORT TERM GOAL #1   Title  Patient independent with HEP    Time  2    Period  Weeks    Status  Achieved      PT SHORT TERM GOAL #2   Title  Patient to report decreased pain by 25% with standing and walking    Time  4    Period  Weeks    Status  On-going        PT Long Term Goals - 02/25/18 1147      PT LONG TERM GOAL #1   Title  Patient able to walk in processional for choir with 75% less pain.    Status  On-going      PT LONG TERM GOAL #2   Title  Patient able to stand in choir during church service without having to sit secondary to pain.    Status  On-going      PT LONG TERM GOAL #3   Status  Partially Met            Plan - 03/09/18 1556    Clinical Impression Statement  Pt continues to do well overall, she reports being able to move some things around her house to creat space for a family member. All exercises completed well with good strength and ROM. Some HS tightness noted with passive stretching.     Rehab Potential  Excellent    PT Frequency  2x / week    PT Duration  8 weeks     PT Treatment/Interventions  ADLs/Self Care Home Management;Electrical Stimulation;Moist Heat;Therapeutic activities;Therapeutic exercise;Neuromuscular re-education;Patient/family education;Dry needling;Taping;Balance training    PT Next Visit Plan  add lumbar stab; hip flexor/ITB flexibility and strengthening       Patient will benefit from skilled therapeutic intervention in order to improve the following deficits and impairments:  Pain, Decreased activity tolerance, Decreased strength, Impaired flexibility, Postural dysfunction  Visit Diagnosis: Muscle weakness (generalized)     Problem List Patient Active Problem List   Diagnosis Date Noted  . On statin therapy due to risk of future cardiovascular event 12/01/2017  . Spondylolisthesis at L5-S1 level 08/13/2017  . Colon polyp 05/25/2017  . Ophthalmic migraine 05/25/2017  . Essential tremor 05/25/2017  . Allergic asthma, mild intermittent, uncomplicated 05/24/2445  . Obesity, Class I, BMI 30-34.9 04/16/2015  . Conjunctivitis, allergic, chronic 10/01/2012  . Hypertriglyceridemia 09/22/2011  . Rosacea 06/25/2011  . Irritable bowel syndrome with diarrhea 07/08/2010  . Benign essential hypertension 05/23/2010  . CKD (chronic kidney disease) stage 3, GFR 30-59 ml/min (HCC) 05/23/2010  . Chronic allergic rhinitis 02/25/2010  . GERD (gastroesophageal reflux disease) 02/25/2010  . Psoriasis 02/25/2010  . Osteoarthritis, multiple sites 06/12/2009    Scot Jun, PTA 03/09/2018, 3:57 PM  [place], [name], [address] Phone: [phone]   Fax:  [phone]  Name: Shequita Peplinski Manthe MRN: [id number] Date of Birth: [birth year]

## 2018-03-11 ENCOUNTER — Encounter: Payer: Self-pay | Admitting: Physical Therapy

## 2018-03-11 ENCOUNTER — Ambulatory Visit: Payer: Medicare Other | Admitting: Physical Therapy

## 2018-03-11 DIAGNOSIS — M6281 Muscle weakness (generalized): Secondary | ICD-10-CM | POA: Diagnosis not present

## 2018-03-11 NOTE — Therapy (Signed)
[place], [name], [address] Phone: [phone]   Fax:  [phone]  Physical Therapy Treatment  Patient Details  Name: Israa Caban Clauss MRN: [id number] Date of Birth: 1941-05-25 Referring Provider (PT): Ferne Reus, [place]   Encounter Date: 03/11/2018  PT End of Session - 03/11/18 1514    Visit Number  11    Number of Visits  16    Date for PT Re-Evaluation  03/29/18    Authorization Type  MCR    PT Start Time  1761    PT Stop Time  1540    PT Time Calculation (min)  55 min       Past Medical History:  Diagnosis Date  . Allergic asthma, mild intermittent, uncomplicated 60/73/7106   Dr. Donneta Romberg, allergist  . Arthritis   . Chronic kidney disease    Stage III kidney disease  . GERD (gastroesophageal reflux disease)   . Hemorrhoids   . History of kidney stones   . Hypertension   . IBS (irritable bowel syndrome)   . Psoriasis     Past Surgical History:  Procedure Laterality Date  . ABDOMINAL HYSTERECTOMY  1987  . CATARACT EXTRACTION W/ INTRAOCULAR LENS  IMPLANT, BILATERAL Bilateral 2015  . CHOLECYSTECTOMY    . HEMORRHOID SURGERY    . SPINE SURGERY  07/10/2017  . TONSILLECTOMY      There were no vitals filed for this visit.  Subjective Assessment - 03/11/18 1448    Subjective  better overall , static standing still not good ( like doing handbells at churh)    Currently in Pain?  Yes    Pain Score  2     Pain Location  Back                       [facility] Adult PT Treatment/Exercise - 03/11/18 0001      Exercises   Exercises  Lumbar;Knee/Hip      Lumbar Exercises: Aerobic   Nustep  L 5 7 min      Lumbar Exercises: Standing   Row  Theraband;Both;15 reps    Theraband Level (Row)  Level 3 (Green)    Shoulder Extension  Theraband;Both;15 reps    Theraband Level (Shoulder Extension)  Level 3 (Green)    Other Standing Lumbar Exercises  hip pulleys 4 way 5# 10  each BIL    Other Standing Lumbar Exercises  Trunk twist with yellow ball x15   OH ext wt ball 15     Knee/Hip Exercises: Standing   Lateral Step Up  Both;1 set;10 reps;Hand Hold: 1;Step Height: 6"   opp leg abd   Forward Step Up  Both;1 set;10 reps;Hand Hold: 1;Step Height: 6"   opp leg ext     Moist Heat Therapy   Number Minutes Moist Heat  15 Minutes    Moist Heat Location  Lumbar Spine      Electrical Stimulation   Electrical Stimulation Location  lumbar    Electrical Stimulation Action  IFC    Electrical Stimulation Parameters  supine    Electrical Stimulation Goals  Pain               PT Short Term Goals - 03/11/18 1451      PT SHORT TERM GOAL #2   Title  Patient to report decreased pain by 25% with standing and walking    Status  Achieved  PT Long Term Goals - 03/11/18 1452      PT LONG TERM GOAL #1   Title  Patient able to walk in processional for choir with 75% less pain.    Status  Partially Met      PT LONG TERM GOAL #2   Title  Patient able to stand in choir during church service without having to sit secondary to pain.    Status  Partially Met      PT LONG TERM GOAL #3   Title  Patient able to ascend/descend stairs at church with minimal pain.    Status  Partially Met      PT LONG TERM GOAL #4   Title  Patient to demo 5/5 BLE strength to improve function.    Status  Partially Met            Plan - 03/11/18 1514    Clinical Impression Statement  progressing with goals. reviewed BM and tips for wt shift and unloading with static standing. added increased standing ex and hip ex which both significantly fatigued pt more    PT Treatment/Interventions  ADLs/Self Care Home Management;Electrical Stimulation;Moist Heat;Therapeutic activities;Therapeutic exercise;Neuromuscular re-education;Patient/family education;Dry needling;Taping;Balance training    PT Next Visit Plan  HIP STRENGTHENING       Patient will benefit from skilled  therapeutic intervention in order to improve the following deficits and impairments:  Pain, Decreased activity tolerance, Decreased strength, Impaired flexibility, Postural dysfunction  Visit Diagnosis: Muscle weakness (generalized)     Problem List Patient Active Problem List   Diagnosis Date Noted  . On statin therapy due to risk of future cardiovascular event 12/01/2017  . Spondylolisthesis at L5-S1 level 08/13/2017  . Colon polyp 05/25/2017  . Ophthalmic migraine 05/25/2017  . Essential tremor 05/25/2017  . Allergic asthma, mild intermittent, uncomplicated 39/35/9409  . Obesity, Class I, BMI 30-34.9 04/16/2015  . Conjunctivitis, allergic, chronic 10/01/2012  . Hypertriglyceridemia 09/22/2011  . Rosacea 06/25/2011  . Irritable bowel syndrome with diarrhea 07/08/2010  . Benign essential hypertension 05/23/2010  . CKD (chronic kidney disease) stage 3, GFR 30-59 ml/min (HCC) 05/23/2010  . Chronic allergic rhinitis 02/25/2010  . GERD (gastroesophageal reflux disease) 02/25/2010  . Psoriasis 02/25/2010  . Osteoarthritis, multiple sites 06/12/2009    PAYSEUR,[name] PTA 03/11/2018, 3:16 PM  [place], [name], [address] Phone: [phone]   Fax:  [phone]  Name: Ionna Avis Herrin MRN: [id number] Date of Birth: 1941-03-31

## 2018-03-16 ENCOUNTER — Encounter: Payer: Self-pay | Admitting: Physical Therapy

## 2018-03-16 ENCOUNTER — Ambulatory Visit: Payer: Medicare Other | Admitting: Physical Therapy

## 2018-03-16 DIAGNOSIS — M6281 Muscle weakness (generalized): Secondary | ICD-10-CM | POA: Diagnosis not present

## 2018-03-16 NOTE — Therapy (Signed)
[place], [name], [address] Phone: [phone]   Fax:  [phone]  Physical Therapy Treatment  Patient Details  Name: Sabrina Ramirez MRN: [id number] Date of Birth: 10/25/1940 Referring Provider (PT): Ferne Reus, [place]   Encounter Date: 03/16/2018  PT End of Session - 03/16/18 1132    Visit Number  12    Date for PT Re-Evaluation  03/29/18    PT Start Time  1100    PT Stop Time  1150    PT Time Calculation (min)  50 min    Activity Tolerance  Patient tolerated treatment well    Behavior During Therapy  [facility] for tasks assessed/performed       Past Medical History:  Diagnosis Date  . Allergic asthma, mild intermittent, uncomplicated 75/64/3329   Dr. Donneta Romberg, allergist  . Arthritis   . Chronic kidney disease    Stage III kidney disease  . GERD (gastroesophageal reflux disease)   . Hemorrhoids   . History of kidney stones   . Hypertension   . IBS (irritable bowel syndrome)   . Psoriasis     Past Surgical History:  Procedure Laterality Date  . ABDOMINAL HYSTERECTOMY  1987  . CATARACT EXTRACTION W/ INTRAOCULAR LENS  IMPLANT, BILATERAL Bilateral 2015  . CHOLECYSTECTOMY    . HEMORRHOID SURGERY    . SPINE SURGERY  07/10/2017  . TONSILLECTOMY      There were no vitals filed for this visit.  Subjective Assessment - 03/16/18 1100    Subjective  "Just a little stiff"    Currently in Pain?  No/denies    Pain Score  0-No pain                       [facility] Adult PT Treatment/Exercise - 03/16/18 0001      Lumbar Exercises: Aerobic   Nustep  L 5 7 min      Lumbar Exercises: Standing   Row  Theraband;Both;15 reps    Theraband Level (Row)  Level 3 (Green)    Shoulder Extension  Theraband;Both;15 reps    Theraband Level (Shoulder Extension)  Level 3 (Green)    Other Standing Lumbar Exercises  hip pulleys 4 way 5# 10 each BIL    Other Standing Lumbar Exercises  Trunk  twist with yellow ball x15   Overhead ext wt ball x15     Knee/Hip Exercises: Standing   Lateral Step Up  Both;1 set;10 reps;Hand Hold: 1;Step Height: 6"    Forward Step Up  Both;1 set;10 reps;Hand Hold: 1;Step Height: 6"      Moist Heat Therapy   Number Minutes Moist Heat  15 Minutes    Moist Heat Location  Lumbar Spine      Electrical Stimulation   Electrical Stimulation Location  lumbar    Electrical Stimulation Action  IFC    Electrical Stimulation Parameters  supine    Electrical Stimulation Goals  Pain               PT Short Term Goals - 03/11/18 1451      PT SHORT TERM GOAL #2   Title  Patient to report decreased pain by 25% with standing and walking    Status  Achieved        PT Long Term Goals - 03/16/18 1133      PT LONG TERM GOAL #1   Title  Patient able to walk in processional  for choir with 75% less pain.      PT LONG TERM GOAL #2   Title  Patient able to stand in choir during church service without having to sit secondary to pain.    Status  Partially Met      PT LONG TERM GOAL #3   Title  Patient able to ascend/descend stairs at church with minimal pain.    Status  Partially Met            Plan - 03/16/18 1133    Clinical Impression Statement  Pt continues to display hip weakness with standing hip exercises. She became very fatigue with standing overhead extensions. A little instability noted with step up activities.     Rehab Potential  Excellent    PT Frequency  2x / week    PT Duration  8 weeks    PT Treatment/Interventions  ADLs/Self Care Home Management;Electrical Stimulation;Moist Heat;Therapeutic activities;Therapeutic exercise;Neuromuscular re-education;Patient/family education;Dry needling;Taping;Balance training    PT Next Visit Plan  HIP STRENGTHENING       Patient will benefit from skilled therapeutic intervention in order to improve the following deficits and impairments:  Pain, Decreased activity tolerance, Decreased  strength, Impaired flexibility, Postural dysfunction  Visit Diagnosis: Muscle weakness (generalized)     Problem List Patient Active Problem List   Diagnosis Date Noted  . On statin therapy due to risk of future cardiovascular event 12/01/2017  . Spondylolisthesis at L5-S1 level 08/13/2017  . Colon polyp 05/25/2017  . Ophthalmic migraine 05/25/2017  . Essential tremor 05/25/2017  . Allergic asthma, mild intermittent, uncomplicated 98/92/1194  . Obesity, Class I, BMI 30-34.9 04/16/2015  . Conjunctivitis, allergic, chronic 10/01/2012  . Hypertriglyceridemia 09/22/2011  . Rosacea 06/25/2011  . Irritable bowel syndrome with diarrhea 07/08/2010  . Benign essential hypertension 05/23/2010  . CKD (chronic kidney disease) stage 3, GFR 30-59 ml/min (HCC) 05/23/2010  . Chronic allergic rhinitis 02/25/2010  . GERD (gastroesophageal reflux disease) 02/25/2010  . Psoriasis 02/25/2010  . Osteoarthritis, multiple sites 06/12/2009    Scot Jun, PTA 03/16/2018, 11:35 AM  [place], [name], [address] Phone: [phone]   Fax:  [phone]  Name: Sabrina Ramirez MRN: [id number] Date of Birth: [birth year]

## 2018-03-18 ENCOUNTER — Ambulatory Visit: Payer: Medicare Other | Admitting: Physical Therapy

## 2018-03-18 ENCOUNTER — Encounter: Payer: Self-pay | Admitting: Physical Therapy

## 2018-03-18 DIAGNOSIS — M6281 Muscle weakness (generalized): Secondary | ICD-10-CM | POA: Diagnosis not present

## 2018-03-18 NOTE — Therapy (Signed)
[place], [name], [address] Phone: [phone]   Fax:  [phone]  Physical Therapy Treatment  Patient Details  Name: Sabrina Ramirez MRN: [id number] Date of Birth: March 22, 1941 Referring Provider (PT): Ferne Reus, [place]   Encounter Date: 03/18/2018  PT End of Session - 03/18/18 1006    Visit Number  13    Date for PT Re-Evaluation  03/29/18    PT Start Time  0930    PT Stop Time  1025    PT Time Calculation (min)  55 min    Activity Tolerance  Patient tolerated treatment well    Behavior During Therapy  [facility] for tasks assessed/performed       Past Medical History:  Diagnosis Date  . Allergic asthma, mild intermittent, uncomplicated 62/69/4854   Dr. Donneta Romberg, allergist  . Arthritis   . Chronic kidney disease    Stage III kidney disease  . GERD (gastroesophageal reflux disease)   . Hemorrhoids   . History of kidney stones   . Hypertension   . IBS (irritable bowel syndrome)   . Psoriasis     Past Surgical History:  Procedure Laterality Date  . ABDOMINAL HYSTERECTOMY  1987  . CATARACT EXTRACTION W/ INTRAOCULAR LENS  IMPLANT, BILATERAL Bilateral 2015  . CHOLECYSTECTOMY    . HEMORRHOID SURGERY    . SPINE SURGERY  07/10/2017  . TONSILLECTOMY      There were no vitals filed for this visit.  Subjective Assessment - 03/18/18 0934    Subjective  "Doing good"    Currently in Pain?  No/denies    Pain Score  0-No pain         [facility] PT Assessment - 03/18/18 0001      Strength   Right Hip Flexion  5/5    Right Hip Extension  5/5    Right Hip ABduction  5/5    Left Hip Flexion  5/5    Left Hip Extension  5/5    Left Hip ABduction  5/5                   [facility] Adult PT Treatment/Exercise - 03/18/18 0001      Ambulation/Gait   Ambulation/Gait  --    Gait Comments  1 flight of stairs, alternating patter 1 Rail R, then back down outside up hill      Lumbar  Exercises: Aerobic   UBE (Upper Arm Bike)  L2.5 x3 min    Nustep  L 5 6 min   SPM >60     Lumbar Exercises: Machines for Strengthening   Cybex Knee Extension  5# 2 sets 10    Cybex Knee Flexion  20# 2x15      Lumbar Exercises: Standing   Row  Theraband;Both;20 reps    Theraband Level (Row)  Level 3 (Green)    Shoulder Extension  Theraband;Both;20 reps    Theraband Level (Shoulder Extension)  Level 3 (Green)      Moist Heat Therapy   Number Minutes Moist Heat  15 Minutes    Moist Heat Location  Lumbar Spine      Electrical Stimulation   Electrical Stimulation Location  lumbar    Electrical Stimulation Action  IFC    Electrical Stimulation Parameters  supine    Electrical Stimulation Goals  Pain               PT Short Term Goals - 03/11/18 1451  PT SHORT TERM GOAL #2   Title  Patient to report decreased pain by 25% with standing and walking    Status  Achieved        PT Long Term Goals - 03/18/18 0935      PT LONG TERM GOAL #4   Title  Patient to demo 5/5 BLE strength to improve function.    Status  Achieved            Plan - 03/18/18 1008    Clinical Impression Statement  Pt able to complete a more function treatment session. She still reports some difficulty with her choir work in church. Pt did become very fatigue with stair negotiation and up hill ambulation requiring 2 lengthily seated rest breaks. Quad fatigue is very quick with LE extensions. Pt reports a little low back pain after up hill ambulation and weak legs.     Rehab Potential  Excellent    PT Frequency  2x / week    PT Duration  8 weeks    PT Treatment/Interventions  ADLs/Self Care Home Management;Electrical Stimulation;Moist Heat;Therapeutic activities;Therapeutic exercise;Neuromuscular re-education;Patient/family education;Dry needling;Taping;Balance training    PT Next Visit Plan  HIP STRENGTHENING and endurance       Patient will benefit from skilled therapeutic intervention in  order to improve the following deficits and impairments:  Pain, Decreased activity tolerance, Decreased strength, Impaired flexibility, Postural dysfunction  Visit Diagnosis: Muscle weakness (generalized)     Problem List Patient Active Problem List   Diagnosis Date Noted  . On statin therapy due to risk of future cardiovascular event 12/01/2017  . Spondylolisthesis at L5-S1 level 08/13/2017  . Colon polyp 05/25/2017  . Ophthalmic migraine 05/25/2017  . Essential tremor 05/25/2017  . Allergic asthma, mild intermittent, uncomplicated 02/33/4356  . Obesity, Class I, BMI 30-34.9 04/16/2015  . Conjunctivitis, allergic, chronic 10/01/2012  . Hypertriglyceridemia 09/22/2011  . Rosacea 06/25/2011  . Irritable bowel syndrome with diarrhea 07/08/2010  . Benign essential hypertension 05/23/2010  . CKD (chronic kidney disease) stage 3, GFR 30-59 ml/min (HCC) 05/23/2010  . Chronic allergic rhinitis 02/25/2010  . GERD (gastroesophageal reflux disease) 02/25/2010  . Psoriasis 02/25/2010  . Osteoarthritis, multiple sites 06/12/2009    Scot Jun, PTA 03/18/2018, 10:10 AM  [place], [name], [address] Phone: [phone]   Fax:  [phone]  Name: Analycia Khokhar Cino MRN: [id number] Date of Birth: 08/06/1940

## 2018-03-23 ENCOUNTER — Ambulatory Visit: Payer: Medicare Other | Admitting: Physical Therapy

## 2018-03-23 ENCOUNTER — Encounter: Payer: Self-pay | Admitting: Physical Therapy

## 2018-03-23 DIAGNOSIS — M6281 Muscle weakness (generalized): Secondary | ICD-10-CM

## 2018-03-23 NOTE — Therapy (Signed)
[name], [name], [address] Phone: [phone]   Fax:  [phone]  Physical Therapy Treatment  Patient Details  Name: Sabrina Ramirez MRN: [id number] Date of Birth: 1940/07/25 Referring Provider (PT): Ferne Reus, [place]   Encounter Date: 03/23/2018  PT End of Session - 03/23/18 1143    Visit Number  14    Date for PT Re-Evaluation  03/29/18    PT Start Time  1100    PT Stop Time  1157    PT Time Calculation (min)  57 min    Activity Tolerance  Patient tolerated treatment well    Behavior During Therapy  [facility] for tasks assessed/performed       Past Medical History:  Diagnosis Date  . Allergic asthma, mild intermittent, uncomplicated 92/42/6834   Dr. Donneta Romberg, allergist  . Arthritis   . Chronic kidney disease    Stage III kidney disease  . GERD (gastroesophageal reflux disease)   . Hemorrhoids   . History of kidney stones   . Hypertension   . IBS (irritable bowel syndrome)   . Psoriasis     Past Surgical History:  Procedure Laterality Date  . ABDOMINAL HYSTERECTOMY  1987  . CATARACT EXTRACTION W/ INTRAOCULAR LENS  IMPLANT, BILATERAL Bilateral 2015  . CHOLECYSTECTOMY    . HEMORRHOID SURGERY    . SPINE SURGERY  07/10/2017  . TONSILLECTOMY      There were no vitals filed for this visit.  Subjective Assessment - 03/23/18 1107    Subjective  "I am really stiff this morning" Pt reports staying on the third floor of a beach house carrying luggage then having a 4 hour ride home has caused some stiffness.     Currently in Pain?  Yes    Pain Score  4     Pain Location  Leg    Pain Descriptors / Indicators  Aching                       [facility] Adult PT Treatment/Exercise - 03/23/18 0001      Ambulation/Gait   Gait Comments  1 flight of stairs then down hall, gaot otside around front [employer]       Exercises   Exercises  Lumbar;Knee/Hip      Lumbar Exercises:  Aerobic   Recumbent Bike  L0 x 4 min     Nustep  L 4 4 min      Lumbar Exercises: Machines for Strengthening   Cybex Knee Extension  5# 2 sets 10    Cybex Knee Flexion  20# 2x15      Lumbar Exercises: Standing   Row  Theraband;Both;20 reps    Theraband Level (Row)  Level 3 (Green)    Shoulder Extension  Theraband;Both;20 reps    Theraband Level (Shoulder Extension)  Level 3 (Green)      Moist Heat Therapy   Number Minutes Moist Heat  15 Minutes    Moist Heat Location  Lumbar Spine      Electrical Stimulation   Electrical Stimulation Location  lumbar    Electrical Stimulation Action  IFCIFC    Electrical Stimulation Parameters  supine    Electrical Stimulation Goals  Pain               PT Short Term Goals - 03/11/18 1451      PT SHORT TERM GOAL #2   Title  Patient to report decreased  pain by 25% with standing and walking    Status  Achieved        PT Long Term Goals - 03/18/18 0935      PT LONG TERM GOAL #4   Title  Patient to demo 5/5 BLE strength to improve function.    Status  Achieved            Plan - 03/23/18 1145    Clinical Impression Statement  Pt reports that's he just returned from beach trip, carrying her bags and length car ride has caused her to be stiff. Pt continues to demo a decrease activity tolerance with more functional interventions. she required 2 seated rest breaks when negotiation stairs and walking. She reports some back pain towards the end but this got better when she started to stand up straighter. Encourage pt to be more active at home.     Rehab Potential  Excellent    PT Frequency  2x / week    PT Duration  8 weeks    PT Next Visit Plan  HIP STRENGTHENING and endurance       Patient will benefit from skilled therapeutic intervention in order to improve the following deficits and impairments:  Pain, Decreased activity tolerance, Decreased strength, Impaired flexibility, Postural dysfunction  Visit Diagnosis: Muscle  weakness (generalized)     Problem List Patient Active Problem List   Diagnosis Date Noted  . On statin therapy due to risk of future cardiovascular event 12/01/2017  . Spondylolisthesis at L5-S1 level 08/13/2017  . Colon polyp 05/25/2017  . Ophthalmic migraine 05/25/2017  . Essential tremor 05/25/2017  . Allergic asthma, mild intermittent, uncomplicated 62/26/3335  . Obesity, Class I, BMI 30-34.9 04/16/2015  . Conjunctivitis, allergic, chronic 10/01/2012  . Hypertriglyceridemia 09/22/2011  . Rosacea 06/25/2011  . Irritable bowel syndrome with diarrhea 07/08/2010  . Benign essential hypertension 05/23/2010  . CKD (chronic kidney disease) stage 3, GFR 30-59 ml/min (HCC) 05/23/2010  . Chronic allergic rhinitis 02/25/2010  . GERD (gastroesophageal reflux disease) 02/25/2010  . Psoriasis 02/25/2010  . Osteoarthritis, multiple sites 06/12/2009    Scot Jun, PTA 03/23/2018, 11:49 AM  [place], [name], [address] Phone: [phone]   Fax:  [phone]  Name: Sabrina Ramirez MRN: [id number] Date of Birth: 10/09/1940

## 2018-03-25 ENCOUNTER — Encounter: Payer: Self-pay | Admitting: Physical Therapy

## 2018-03-25 ENCOUNTER — Ambulatory Visit: Payer: Medicare Other | Admitting: Physical Therapy

## 2018-03-25 DIAGNOSIS — M6281 Muscle weakness (generalized): Secondary | ICD-10-CM

## 2018-03-25 NOTE — Therapy (Signed)
[place], [name], [address] Phone: [phone]   Fax:  [phone]  Physical Therapy Treatment/Recertification  Patient Details  Name: Sabrina Ramirez MRN: [id number] Date of Birth: September 18, 1940 Referring Provider (PT): Ferne Reus, [place]   Encounter Date: 03/25/2018  PT End of Session - 03/25/18 1155    Visit Number  15    Number of Visits  19    Date for PT Re-Evaluation  04/22/18    Authorization Type  MCR    PT Start Time  1106    PT Stop Time  1200    PT Time Calculation (min)  54 min    Activity Tolerance  Patient tolerated treatment well    Behavior During Therapy  [facility] for tasks assessed/performed       Past Medical History:  Diagnosis Date  . Allergic asthma, mild intermittent, uncomplicated 12/07/1599   Dr. Donneta Romberg, allergist  . Arthritis   . Chronic kidney disease    Stage III kidney disease  . GERD (gastroesophageal reflux disease)   . Hemorrhoids   . History of kidney stones   . Hypertension   . IBS (irritable bowel syndrome)   . Psoriasis     Past Surgical History:  Procedure Laterality Date  . ABDOMINAL HYSTERECTOMY  1987  . CATARACT EXTRACTION W/ INTRAOCULAR LENS  IMPLANT, BILATERAL Bilateral 2015  . CHOLECYSTECTOMY    . HEMORRHOID SURGERY    . SPINE SURGERY  07/10/2017  . TONSILLECTOMY      There were no vitals filed for this visit.  Subjective Assessment - 03/25/18 1149    Subjective  Pt relays she still has some back pain, and that her biggest limitation is that she can't walk community distances due to fatigue.     Currently in Pain?  Yes    Pain Score  3     Pain Location  Back    Pain Orientation  Right;Left;Lower    Pain Descriptors / Indicators  Aching    Pain Type  Chronic pain         [facility] PT Assessment - 03/25/18 0001      Assessment   Medical Diagnosis  spondylolisthesis; s/p lumbar fusion    Referring Provider (PT)  Ferne Reus, PA-C    Onset Date/Surgical Date  08/13/17      Strength   Right Hip Flexion  4+/5    Right Hip ABduction  5/5    Left Hip Flexion  5/5    Left Hip ABduction  5/5    Right Knee Flexion  5/5    Right Knee Extension  5/5    Left Knee Flexion  5/5      Ambulation/Gait   Gait Comments  1 flight of stairs then down hall, gaot otside around front island    pt had to take 3 rest breaks                  [facility] Adult PT Treatment/Exercise - 03/25/18 0001      Lumbar Exercises: Aerobic   Recumbent Bike  L0 x 4 min     Nustep  L 4 4 min      Lumbar Exercises: Machines for Strengthening   Cybex Knee Extension  5# 3 sets 10    Cybex Knee Flexion  20# 2x15      Lumbar Exercises: Standing   Row  Theraband;Both   25 reps   Theraband Level (Row)  Level 3 (Green)    Shoulder Extension  Theraband   25 reps   Theraband Level (Shoulder Extension)  Level 3 (Green)      Moist Heat Therapy   Number Minutes Moist Heat  10 Minutes    Moist Heat Location  Lumbar Spine      Electrical Stimulation   Electrical Stimulation Location  lumbar    Electrical Stimulation Action  IFC    Electrical Stimulation Parameters  supine    Electrical Stimulation Goals  Pain             PT Education - 03/25/18 1155    Education Details  POC    Person(s) Educated  Patient    Methods  Explanation    Comprehension  Verbalized understanding       PT Short Term Goals - 03/11/18 1451      PT SHORT TERM GOAL #2   Title  Patient to report decreased pain by 25% with standing and walking    Status  Achieved        PT Long Term Goals - 03/25/18 1204      PT LONG TERM GOAL #1   Title  Patient able to walk in processional for choir with 75% less pain.    Status  Achieved      PT LONG TERM GOAL #2   Title  Patient able to stand in choir during church service without having to sit secondary to pain.    Status  Achieved      PT LONG TERM GOAL #3   Title  Patient able to  ascend/descend stairs at church with minimal pain.    Status  Achieved      PT LONG TERM GOAL #4   Title  Patient to demo 5/5 BLE strength to improve function.    Baseline  4+/5 Rt hip flexion    Status  Partially Met      PT LONG TERM GOAL #5   Title  New goal added 03/25/18: Pt will be able to ambulate one lap around PT building and stairs without needing rest break, to show improved endurance for community ambulation.     Time  4    Period  Weeks    Status  New    Target Date  03/17/19            Plan - 03/25/18 1156    Clinical Impression Statement  Recert performed today as she is at end of POC. She has done well with PT and has made good progress except she still has Rt hip weakness and decreased activity tolerance. She required 3 resting breaks to complete one lap around the building with stairs. She will continue to benefit from Skilled PT to address these deficits. PT anticipates 4 more visits with new goal added today for community ambulation of one lap around the building without needing rest break.     Rehab Potential  Excellent    PT Frequency  --   1-2   PT Duration  4 weeks    PT Treatment/Interventions  ADLs/Self Care Home Management;Electrical Stimulation;Moist Heat;Therapeutic activities;Therapeutic exercise;Neuromuscular re-education;Patient/family education;Dry needling;Taping;Balance training    PT Next Visit Plan  HIP STRENGTHENING and endurance    Consulted and Agree with Plan of Care  Patient       Patient will benefit from skilled therapeutic intervention in order to improve the following deficits and impairments:  Pain, Decreased activity tolerance, Decreased strength, Impaired flexibility, Postural dysfunction  Visit Diagnosis: Muscle weakness (generalized)     Problem List Patient Active Problem List   Diagnosis Date Noted  . On statin therapy due to risk of future cardiovascular event 12/01/2017  . Spondylolisthesis at L5-S1 level 08/13/2017   . Colon polyp 05/25/2017  . Ophthalmic migraine 05/25/2017  . Essential tremor 05/25/2017  . Allergic asthma, mild intermittent, uncomplicated 09/41/7919  . Obesity, Class I, BMI 30-34.9 04/16/2015  . Conjunctivitis, allergic, chronic 10/01/2012  . Hypertriglyceridemia 09/22/2011  . Rosacea 06/25/2011  . Irritable bowel syndrome with diarrhea 07/08/2010  . Benign essential hypertension 05/23/2010  . CKD (chronic kidney disease) stage 3, GFR 30-59 ml/min (HCC) 05/23/2010  . Chronic allergic rhinitis 02/25/2010  . GERD (gastroesophageal reflux disease) 02/25/2010  . Psoriasis 02/25/2010  . Osteoarthritis, multiple sites 06/12/2009    Debbe Odea, PT, DPT 03/25/2018, 12:07 PM  [place], [name], [address] Phone: [phone]   Fax:  [phone]  Name: Sabrina Ramirez MRN: [id number] Date of Birth: 05/30/1941

## 2018-04-01 ENCOUNTER — Ambulatory Visit: Payer: Medicare Other | Admitting: Physical Therapy

## 2018-04-01 ENCOUNTER — Encounter: Payer: Self-pay | Admitting: Physical Therapy

## 2018-04-01 DIAGNOSIS — M6281 Muscle weakness (generalized): Secondary | ICD-10-CM

## 2018-04-01 NOTE — Therapy (Signed)
[place] [place], [name], [address] Phone: [phone]   Fax:  [phone]  Physical Therapy Treatment  Patient Details  Name: Sabrina Ramirez MRN: [id number] Date of Birth: 01/10/1941 Referring Provider (PT): Ferne Reus, [place]   Encounter Date: 04/01/2018  PT End of Session - 04/01/18 1334    Visit Number  16    Date for PT Re-Evaluation  04/22/18    Authorization Type  MCR    PT Start Time  1300    PT Stop Time  1340    PT Time Calculation (min)  40 min    Activity Tolerance  Patient tolerated treatment well    Behavior During Therapy  [facility] for tasks assessed/performed       Past Medical History:  Diagnosis Date  . Allergic asthma, mild intermittent, uncomplicated 91/50/5697   Dr. Donneta Romberg, allergist  . Arthritis   . Chronic kidney disease    Stage III kidney disease  . GERD (gastroesophageal reflux disease)   . Hemorrhoids   . History of kidney stones   . Hypertension   . IBS (irritable bowel syndrome)   . Psoriasis     Past Surgical History:  Procedure Laterality Date  . ABDOMINAL HYSTERECTOMY  1987  . CATARACT EXTRACTION W/ INTRAOCULAR LENS  IMPLANT, BILATERAL Bilateral 2015  . CHOLECYSTECTOMY    . HEMORRHOID SURGERY    . SPINE SURGERY  07/10/2017  . TONSILLECTOMY      There were no vitals filed for this visit.  Subjective Assessment - 04/01/18 1305    Subjective  "Feeling good today" Pt reports that after her flight she was having some back pain and also some long walks  in the airport. Pain lasted two days    Currently in Pain?  Yes    Pain Score  2     Pain Location  Back    Pain Orientation  Right                       [facility] Adult PT Treatment/Exercise - 04/01/18 0001      Ambulation/Gait   Gait Comments  2 flights of stairs standing rest break in between, Down hall one seated rest break at end then back down hall.      Lumbar Exercises: Aerobic    Nustep  L 4 6 min      Lumbar Exercises: Machines for Strengthening   Leg Press  20lb 2x15    Other Lumbar Machine Exercise  Rows and lats 20lb 2x10       Lumbar Exercises: Seated   Sit to Stand  15 reps   x2 with OHP with yellow ball               PT Short Term Goals - 03/11/18 1451      PT SHORT TERM GOAL #2   Title  Patient to report decreased pain by 25% with standing and walking    Status  Achieved        PT Long Term Goals - 03/25/18 1204      PT LONG TERM GOAL #1   Title  Patient able to walk in processional for choir with 75% less pain.    Status  Achieved      PT LONG TERM GOAL #2   Title  Patient able to stand in choir during church service without having to sit secondary to pain.    Status  Achieved      PT LONG TERM GOAL #3   Title  Patient able to ascend/descend stairs at church with minimal pain.    Status  Achieved      PT LONG TERM GOAL #4   Title  Patient to demo 5/5 BLE strength to improve function.    Baseline  4+/5 Rt hip flexion    Status  Partially Met      PT LONG TERM GOAL #5   Title  New goal added 03/25/18: Pt will be able to ambulate one lap around PT building and stairs without needing rest break, to show improved endurance for community ambulation.     Time  4    Period  Weeks    Status  New    Target Date  03/17/19            Plan - 04/01/18 1340    Clinical Impression Statement  Pt able to complete all of today's activities, but she becomes very fatigue with activity requiring multiple rest breaks. Reports a recent incident of low back pain after a plane flight, pain lasted for two days and went away. No reports of pain with today's interventions.     Rehab Potential  Excellent    PT Frequency  --   1-2   PT Duration  4 weeks    PT Next Visit Plan  HIP STRENGTHENING and endurance       Patient will benefit from skilled therapeutic intervention in order to improve the following deficits and impairments:  Pain,  Decreased activity tolerance, Decreased strength, Impaired flexibility, Postural dysfunction  Visit Diagnosis: Muscle weakness (generalized)     Problem List Patient Active Problem List   Diagnosis Date Noted  . On statin therapy due to risk of future cardiovascular event 12/01/2017  . Spondylolisthesis at L5-S1 level 08/13/2017  . Colon polyp 05/25/2017  . Ophthalmic migraine 05/25/2017  . Essential tremor 05/25/2017  . Allergic asthma, mild intermittent, uncomplicated 61/84/8592  . Obesity, Class I, BMI 30-34.9 04/16/2015  . Conjunctivitis, allergic, chronic 10/01/2012  . Hypertriglyceridemia 09/22/2011  . Rosacea 06/25/2011  . Irritable bowel syndrome with diarrhea 07/08/2010  . Benign essential hypertension 05/23/2010  . CKD (chronic kidney disease) stage 3, GFR 30-59 ml/min (HCC) 05/23/2010  . Chronic allergic rhinitis 02/25/2010  . GERD (gastroesophageal reflux disease) 02/25/2010  . Psoriasis 02/25/2010  . Osteoarthritis, multiple sites 06/12/2009    Scot Jun, PTA 04/01/2018, 1:42 PM  [place], [name], [address] Phone: [phone]   Fax:  [phone]  Name: Deatra Mcmahen Jeng MRM: [id number] Date of Birth: [birth year]

## 2018-04-06 ENCOUNTER — Ambulatory Visit: Payer: Medicare Other | Admitting: Physical Therapy

## 2018-04-06 DIAGNOSIS — M6281 Muscle weakness (generalized): Secondary | ICD-10-CM | POA: Diagnosis not present

## 2018-04-06 NOTE — Therapy (Signed)
[place], [name], [address] Phone: [phone]   Fax:  [phone]  Physical Therapy Treatment  Patient Details  Name: Sabrina Ramirez MRN: [id number] Date of Birth: 06-17-1940 Referring Provider (PT): Ferne Reus, [place]   Encounter Date: 04/06/2018  PT End of Session - 04/06/18 1145    Visit Number  17    Number of Visits  19    Date for PT Re-Evaluation  04/22/18    Authorization Type  MCR    PT Start Time  1107    PT Stop Time  1145    PT Time Calculation (min)  38 min    Activity Tolerance  Patient tolerated treatment well    Behavior During Therapy  [facility] for tasks assessed/performed       Past Medical History:  Diagnosis Date  . Allergic asthma, mild intermittent, uncomplicated 35/57/3220   Dr. Donneta Romberg, allergist  . Arthritis   . Chronic kidney disease    Stage III kidney disease  . GERD (gastroesophageal reflux disease)   . Hemorrhoids   . History of kidney stones   . Hypertension   . IBS (irritable bowel syndrome)   . Psoriasis     Past Surgical History:  Procedure Laterality Date  . ABDOMINAL HYSTERECTOMY  1987  . CATARACT EXTRACTION W/ INTRAOCULAR LENS  IMPLANT, BILATERAL Bilateral 2015  . CHOLECYSTECTOMY    . HEMORRHOID SURGERY    . SPINE SURGERY  07/10/2017  . TONSILLECTOMY      There were no vitals filed for this visit.  Subjective Assessment - 04/06/18 1112    Subjective  Patient reports she was cleaning her shower and had difficulty getting up from the bench she was sitting on. She also states that in the past few weeks she's had to get some things out from under a bed and had diffculty with this transition to standing as well.     Pertinent History  lumbar fusion 3/19; hysterectomy    Limitations  Walking;Standing    Diagnostic tests  xrays - healed well    Patient Stated Goals  be able to perform church activities without pain    Currently in Pain?  Yes     Pain Score  4     Pain Location  Back    Pain Orientation  Right    Pain Descriptors / Indicators  Aching    Pain Type  Chronic pain    Pain Radiating Towards  into bil hips    Pain Onset  More than a month ago    Pain Frequency  Intermittent    Aggravating Factors   standing and walking                       [facility] Adult PT Treatment/Exercise - 04/06/18 0001      Ambulation/Gait   Gait Comments  2 flights of stairs standing rest break in between      Therapeutic Activites    Therapeutic Activities  ADL's    ADL's  stand to floor to stand transfers x 4 at mat then 2x at hight mat table simulating bed.      Lumbar Exercises: Aerobic   Nustep  L4 x 5 min Legs only      Lumbar Exercises: Machines for Strengthening   Other Lumbar Machine Exercise  Rows and lats 20lb 2x10       Lumbar Exercises: Standing   Other  Standing Lumbar Exercises  wall slides 2x7    Other Standing Lumbar Exercises  functional squat x 7 to high mat simulating bed               PT Short Term Goals - 03/11/18 1451      PT SHORT TERM GOAL #2   Title  Patient to report decreased pain by 25% with standing and walking    Status  Achieved        PT Long Term Goals - 03/25/18 1204      PT LONG TERM GOAL #1   Title  Patient able to walk in processional for choir with 75% less pain.    Status  Achieved      PT LONG TERM GOAL #2   Title  Patient able to stand in choir during church service without having to sit secondary to pain.    Status  Achieved      PT LONG TERM GOAL #3   Title  Patient able to ascend/descend stairs at church with minimal pain.    Status  Achieved      PT LONG TERM GOAL #4   Title  Patient to demo 5/5 BLE strength to improve function.    Baseline  4+/5 Rt hip flexion    Status  Partially Met      PT LONG TERM GOAL #5   Title  New goal added 03/25/18: Pt will be able to ambulate one lap around PT building and stairs without needing rest break, to show  improved endurance for community ambulation.     Time  4    Period  Weeks    Status  New    Target Date  03/17/19            Plan - 04/06/18 1150    Clinical Impression Statement  Patient did very well with floor to stand transfers today. She is limited in the number of reps she can do, but was able to do with one UE assist. She did fairly well with wall slides, but fatigues quickly. She required rest period after descending 2 flights of stairs and after ascending 2 fights as well.    PT Treatment/Interventions  ADLs/Self Care Home Management;Electrical Stimulation;Moist Heat;Therapeutic activities;Therapeutic exercise;Neuromuscular re-education;Patient/family education;Dry needling;Taping;Balance training    PT Next Visit Plan  HIP STRENGTHENING and endurance    PT Home Exercise Plan  seated HS stretch, LTR, SKTC, supine QL stretch, seated figure 4       Patient will benefit from skilled therapeutic intervention in order to improve the following deficits and impairments:  Pain, Decreased activity tolerance, Decreased strength, Impaired flexibility, Postural dysfunction  Visit Diagnosis: Muscle weakness (generalized)     Problem List Patient Active Problem List   Diagnosis Date Noted  . On statin therapy due to risk of future cardiovascular event 12/01/2017  . Spondylolisthesis at L5-S1 level 08/13/2017  . Colon polyp 05/25/2017  . Ophthalmic migraine 05/25/2017  . Essential tremor 05/25/2017  . Allergic asthma, mild intermittent, uncomplicated 03/88/8280  . Obesity, Class I, BMI 30-34.9 04/16/2015  . Conjunctivitis, allergic, chronic 10/01/2012  . Hypertriglyceridemia 09/22/2011  . Rosacea 06/25/2011  . Irritable bowel syndrome with diarrhea 07/08/2010  . Benign essential hypertension 05/23/2010  . CKD (chronic kidney disease) stage 3, GFR 30-59 ml/min (HCC) 05/23/2010  . Chronic allergic rhinitis 02/25/2010  . GERD (gastroesophageal reflux disease) 02/25/2010  .  Psoriasis 02/25/2010  . Osteoarthritis, multiple sites 06/12/2009    Osmar Howton PT  04/06/2018, 11:55 AM  [place], [name], [address] Phone: [phone]   Fax:  [phone]  Name: Sabrina Ramirez MRN: [id number] Date of Birth: [birth year]

## 2018-04-08 ENCOUNTER — Ambulatory Visit: Payer: Medicare Other | Admitting: Physical Therapy

## 2018-04-08 DIAGNOSIS — M6281 Muscle weakness (generalized): Secondary | ICD-10-CM | POA: Diagnosis not present

## 2018-04-08 NOTE — Therapy (Signed)
[place] [address] [place] [place], [name], [address] Phone: [phone]   Fax:  [phone]  Physical Therapy Treatment  Patient Details  Name: Sabrina Ramirez MRN: [id number] Date of Birth: 02-20-1941 Referring Provider (PT): Ferne Reus, [place]   Encounter Date: 04/08/2018  PT End of Session - 04/08/18 1145    Visit Number  18    Number of Visits  19    Date for PT Re-Evaluation  04/22/18    Authorization Type  MCR    PT Start Time  1105    PT Stop Time  1143    PT Time Calculation (min)  38 min    Activity Tolerance  Patient tolerated treatment well    Behavior During Therapy  [facility] for tasks assessed/performed       Past Medical History:  Diagnosis Date  . Allergic asthma, mild intermittent, uncomplicated 65/46/5035   Dr. Donneta Romberg, allergist  . Arthritis   . Chronic kidney disease    Stage III kidney disease  . GERD (gastroesophageal reflux disease)   . Hemorrhoids   . History of kidney stones   . Hypertension   . IBS (irritable bowel syndrome)   . Psoriasis     Past Surgical History:  Procedure Laterality Date  . ABDOMINAL HYSTERECTOMY  1987  . CATARACT EXTRACTION W/ INTRAOCULAR LENS  IMPLANT, BILATERAL Bilateral 2015  . CHOLECYSTECTOMY    . HEMORRHOID SURGERY    . SPINE SURGERY  07/10/2017  . TONSILLECTOMY      There were no vitals filed for this visit.  Subjective Assessment - 04/08/18 1127    Subjective  Pt relays no pain just stiffness today    Currently in Pain?  No/denies                       [facility] Adult PT Treatment/Exercise - 04/08/18 0001      Ambulation/Gait   Gait Comments  2 flights of stairs standing rest break in between, walking in hall from last bench closest to rehab to long end of hall fwd, bwkd, lateral X 1 ea      Lumbar Exercises: Stretches   Other Lumbar Stretch Exercise  UT stretch for neck stiffness she reports today, 2 reps X 30 sec bilat      Lumbar Exercises: Aerobic   Recumbent Bike  5 min      Lumbar Exercises: Machines for Strengthening   Cybex Knee Extension  10 lbs 2 sets of 10    Cybex Knee Flexion  25 lbs 2 sets of 15    Other Lumbar Machine Exercise  Rows and lats 20lb 2x10       Lumbar Exercises: Seated   Sit to Stand  20 reps   with ball sq, 2 sets of 10              PT Short Term Goals - 03/11/18 1451      PT SHORT TERM GOAL #2   Title  Patient to report decreased pain by 25% with standing and walking    Status  Achieved        PT Long Term Goals - 03/25/18 1204      PT LONG TERM GOAL #1   Title  Patient able to walk in processional for choir with 75% less pain.    Status  Achieved      PT LONG TERM GOAL #2   Title  Patient able  to stand in choir during church service without having to sit secondary to pain.    Status  Achieved      PT LONG TERM GOAL #3   Title  Patient able to ascend/descend stairs at church with minimal pain.    Status  Achieved      PT LONG TERM GOAL #4   Title  Patient to demo 5/5 BLE strength to improve function.    Baseline  4+/5 Rt hip flexion    Status  Partially Met      PT LONG TERM GOAL #5   Title  New goal added 03/25/18: Pt will be able to ambulate one lap around PT building and stairs without needing rest break, to show improved endurance for community ambulation.     Time  4    Period  Weeks    Status  New    Target Date  03/17/19            Plan - 04/08/18 1145    Clinical Impression Statement  Session focused on functional strength and endurance today to tolerance. She still requires rest breaks but these appear to be less frequent now.     Rehab Potential  Excellent    PT Frequency  Other (comment)   1-2   PT Duration  4 weeks    PT Treatment/Interventions  ADLs/Self Care Home Management;Electrical Stimulation;Moist Heat;Therapeutic activities;Therapeutic exercise;Neuromuscular re-education;Patient/family education;Dry  needling;Taping;Balance training    PT Next Visit Plan  HIP STRENGTHENING and endurance    Consulted and Agree with Plan of Care  Patient       Patient will benefit from skilled therapeutic intervention in order to improve the following deficits and impairments:  Pain, Decreased activity tolerance, Decreased strength, Impaired flexibility, Postural dysfunction  Visit Diagnosis: Muscle weakness (generalized)     Problem List Patient Active Problem List   Diagnosis Date Noted  . On statin therapy due to risk of future cardiovascular event 12/01/2017  . Spondylolisthesis at L5-S1 level 08/13/2017  . Colon polyp 05/25/2017  . Ophthalmic migraine 05/25/2017  . Essential tremor 05/25/2017  . Allergic asthma, mild intermittent, uncomplicated 61/68/3729  . Obesity, Class I, BMI 30-34.9 04/16/2015  . Conjunctivitis, allergic, chronic 10/01/2012  . Hypertriglyceridemia 09/22/2011  . Rosacea 06/25/2011  . Irritable bowel syndrome with diarrhea 07/08/2010  . Benign essential hypertension 05/23/2010  . CKD (chronic kidney disease) stage 3, GFR 30-59 ml/min (HCC) 05/23/2010  . Chronic allergic rhinitis 02/25/2010  . GERD (gastroesophageal reflux disease) 02/25/2010  . Psoriasis 02/25/2010  . Osteoarthritis, multiple sites 06/12/2009    Debbe Odea, PT, DPT 04/08/2018, 11:49 AM  [place], [name], [address] Phone: [phone]   Fax:  [phone]  Name: Sabrina Ramirez MRN: [id number] Date of Birth: [birth year]

## 2018-04-13 ENCOUNTER — Ambulatory Visit: Payer: Medicare Other | Attending: Orthopedic Surgery | Admitting: Physical Therapy

## 2018-04-13 ENCOUNTER — Encounter: Payer: Self-pay | Admitting: Physical Therapy

## 2018-04-13 DIAGNOSIS — M6281 Muscle weakness (generalized): Secondary | ICD-10-CM | POA: Insufficient documentation

## 2018-04-13 NOTE — Therapy (Signed)
[place] [address] [place], [name], [address] Phone: [phone]   Fax:  [phone]  Physical Therapy Treatment  Patient Details  Name: Sabrina Ramirez MRN: [id number] Date of Birth: 02/24/1941 Referring Provider (PT): Ferne Reus, [place]   Encounter Date: 04/13/2018  PT End of Session - 04/13/18 1147    Visit Number  19    Date for PT Re-Evaluation  04/22/18    PT Start Time  1105    PT Stop Time  1145    PT Time Calculation (min)  40 min    Activity Tolerance  Patient tolerated treatment well    Behavior During Therapy  [facility] for tasks assessed/performed       Past Medical History:  Diagnosis Date  . Allergic asthma, mild intermittent, uncomplicated 85/88/5027   Dr. Donneta Romberg, allergist  . Arthritis   . Chronic kidney disease    Stage III kidney disease  . GERD (gastroesophageal reflux disease)   . Hemorrhoids   . History of kidney stones   . Hypertension   . IBS (irritable bowel syndrome)   . Psoriasis     Past Surgical History:  Procedure Laterality Date  . ABDOMINAL HYSTERECTOMY  1987  . CATARACT EXTRACTION W/ INTRAOCULAR LENS  IMPLANT, BILATERAL Bilateral 2015  . CHOLECYSTECTOMY    . HEMORRHOID SURGERY    . SPINE SURGERY  07/10/2017  . TONSILLECTOMY      There were no vitals filed for this visit.  Subjective Assessment - 04/13/18 1111    Subjective  Pretty good, pt stated that it was easier to walk in the choir and sing    Currently in Pain?  No/denies                       [facility] Adult PT Treatment/Exercise - 04/13/18 0001      Ambulation/Gait   Gait Comments  gait aroung building up and down slope, one stand rest break before going up hill. One flight of stairs alt pattern      Lumbar Exercises: Aerobic   Nustep  L 3 7 min      Lumbar Exercises: Machines for Strengthening   Other Lumbar Machine Exercise  Rows and lats 20lb 2x10       Lumbar Exercises: Seated    Sit to Stand  10 reps   x2 with red ball               PT Short Term Goals - 03/11/18 1451      PT SHORT TERM GOAL #2   Title  Patient to report decreased pain by 25% with standing and walking    Status  Achieved        PT Long Term Goals - 03/25/18 1204      PT LONG TERM GOAL #1   Title  Patient able to walk in processional for choir with 75% less pain.    Status  Achieved      PT LONG TERM GOAL #2   Title  Patient able to stand in choir during church service without having to sit secondary to pain.    Status  Achieved      PT LONG TERM GOAL #3   Title  Patient able to ascend/descend stairs at church with minimal pain.    Status  Achieved      PT LONG TERM GOAL #4   Title  Patient to demo 5/5 BLE strength  to improve function.    Baseline  4+/5 Rt hip flexion    Status  Partially Met      PT LONG TERM GOAL #5   Title  New goal added 03/25/18: Pt will be able to ambulate one lap around PT building and stairs without needing rest break, to show improved endurance for community ambulation.     Time  4    Period  Weeks    Status  New    Target Date  03/17/19            Plan - 04/13/18 1147    Clinical Impression Statement  Pt reports that it has been a little easier to sing in the choir. Pt became very fatigue ambulating around building requiring one stranding rest break during and one lengthy seated rest break afterwards. She did well with one flight of stairs but again became fatigue.     Rehab Potential  Excellent    PT Duration  4 weeks    PT Next Visit Plan  HIP STRENGTHENING and endurance       Patient will benefit from skilled therapeutic intervention in order to improve the following deficits and impairments:  Pain, Decreased activity tolerance, Decreased strength, Impaired flexibility, Postural dysfunction  Visit Diagnosis: Muscle weakness (generalized)     Problem List Patient Active Problem List   Diagnosis Date Noted  . On statin  therapy due to risk of future cardiovascular event 12/01/2017  . Spondylolisthesis at L5-S1 level 08/13/2017  . Colon polyp 05/25/2017  . Ophthalmic migraine 05/25/2017  . Essential tremor 05/25/2017  . Allergic asthma, mild intermittent, uncomplicated 03/29/1172  . Obesity, Class I, BMI 30-34.9 04/16/2015  . Conjunctivitis, allergic, chronic 10/01/2012  . Hypertriglyceridemia 09/22/2011  . Rosacea 06/25/2011  . Irritable bowel syndrome with diarrhea 07/08/2010  . Benign essential hypertension 05/23/2010  . CKD (chronic kidney disease) stage 3, GFR 30-59 ml/min (HCC) 05/23/2010  . Chronic allergic rhinitis 02/25/2010  . GERD (gastroesophageal reflux disease) 02/25/2010  . Psoriasis 02/25/2010  . Osteoarthritis, multiple sites 06/12/2009    Scot Jun, PTA 04/13/2018, 11:52 AM  [place], [name], [address] Phone: [phone]   Fax:  [phone]  Name: Sabrina Ramirez MRN: [id number] Date of Birth: 02-Jun-1941

## 2018-04-15 ENCOUNTER — Encounter: Payer: Self-pay | Admitting: Physical Therapy

## 2018-04-15 ENCOUNTER — Ambulatory Visit: Payer: Medicare Other | Admitting: Physical Therapy

## 2018-04-15 DIAGNOSIS — M6281 Muscle weakness (generalized): Secondary | ICD-10-CM

## 2018-04-15 NOTE — Therapy (Signed)
[place] [place], [name], [address] Phone: [phone]   Fax:  [phone]  Physical Therapy Treatment  Patient Details  Name: Sabrina Ramirez MRN: [id number] Date of Birth: August 27, 1940 Referring Provider (PT): Ferne Reus, [place]   Encounter Date: 04/15/2018  PT End of Session - 04/15/18 1139    Visit Number  20    Date for PT Re-Evaluation  04/22/18    PT Start Time  1100    PT Stop Time  1140    PT Time Calculation (min)  40 min    Activity Tolerance  Patient tolerated treatment well    Behavior During Therapy  [facility] for tasks assessed/performed       Past Medical History:  Diagnosis Date  . Allergic asthma, mild intermittent, uncomplicated 63/33/5456   Dr. Donneta Romberg, allergist  . Arthritis   . Chronic kidney disease    Stage III kidney disease  . GERD (gastroesophageal reflux disease)   . Hemorrhoids   . History of kidney stones   . Hypertension   . IBS (irritable bowel syndrome)   . Psoriasis     Past Surgical History:  Procedure Laterality Date  . ABDOMINAL HYSTERECTOMY  1987  . CATARACT EXTRACTION W/ INTRAOCULAR LENS  IMPLANT, BILATERAL Bilateral 2015  . CHOLECYSTECTOMY    . HEMORRHOID SURGERY    . SPINE SURGERY  07/10/2017  . TONSILLECTOMY      There were no vitals filed for this visit.  Subjective Assessment - 04/15/18 1109    Subjective  "Pretty good"    Currently in Pain?  No/denies    Pain Score  0-No pain                       [facility] Adult PT Treatment/Exercise - 04/15/18 0001      Ambulation/Gait   Gait Comments  ambulated outside around front [place] over uneven terrain. One seated rest break, then hlaf fflight of stairs      Lumbar Exercises: Aerobic   Recumbent Bike  5 min    Nustep  L 3 24mn      Lumbar Exercises: Machines for Strengthening   Leg Press  20lb 2x15    Other Lumbar Machine Exercise  Rows and lats 20lb 2x10                 PT Short Term Goals - 03/11/18 1451      PT SHORT TERM GOAL #2   Title  Patient to report decreased pain by 25% with standing and walking    Status  Achieved        PT Long Term Goals - 04/15/18 1139      PT LONG TERM GOAL #1   Title  Patient able to walk in processional for choir with 75% less pain.    Status  Achieved      PT LONG TERM GOAL #2   Title  Patient able to stand in choir during church service without having to sit secondary to pain.    Status  Achieved      PT LONG TERM GOAL #3   Title  Patient able to ascend/descend stairs at church with minimal pain.    Status  Achieved      PT LONG TERM GOAL #4   Title  Patient to demo 5/5 BLE strength to improve function.    Status  Achieved      PT LONG  TERM GOAL #5   Title  New goal added 03/25/18: Pt will be able to ambulate one lap around PT building and stairs without needing rest break, to show improved endurance for community ambulation.     Status  Achieved            Plan - 04/15/18 1140    Clinical Impression Statement  All goals met. No issues with today's exercises. She does report some LE soreness on her lateral thighs when ambulating. The sensation went away after resting.    Rehab Potential  Excellent    PT Treatment/Interventions  ADLs/Self Care Home Management;Electrical Stimulation;Moist Heat;Therapeutic activities;Therapeutic exercise;Neuromuscular re-education;Patient/family education;Dry needling;Taping;Balance training    PT Next Visit Plan  D/C PT     HEP: Gave pt print ut of all machine level interventions with appropriate weight  Patient will benefit from skilled therapeutic intervention in order to improve the following deficits and impairments:  Pain, Decreased activity tolerance, Decreased strength, Impaired flexibility, Postural dysfunction  Visit Diagnosis: Muscle weakness (generalized)     Problem List Patient Active Problem List   Diagnosis Date Noted  .  On statin therapy due to risk of future cardiovascular event 12/01/2017  . Spondylolisthesis at L5-S1 level 08/13/2017  . Colon polyp 05/25/2017  . Ophthalmic migraine 05/25/2017  . Essential tremor 05/25/2017  . Allergic asthma, mild intermittent, uncomplicated 37/62/8315  . Obesity, Class I, BMI 30-34.9 04/16/2015  . Conjunctivitis, allergic, chronic 10/01/2012  . Hypertriglyceridemia 09/22/2011  . Rosacea 06/25/2011  . Irritable bowel syndrome with diarrhea 07/08/2010  . Benign essential hypertension 05/23/2010  . CKD (chronic kidney disease) stage 3, GFR 30-59 ml/min (HCC) 05/23/2010  . Chronic allergic rhinitis 02/25/2010  . GERD (gastroesophageal reflux disease) 02/25/2010  . Psoriasis 02/25/2010  . Osteoarthritis, multiple sites 06/12/2009   PHYSICAL THERAPY DISCHARGE SUMMARY  Visits from Start of Care: 20 Plan: Patient agrees to discharge.  Patient goals were met. Patient is being discharged due to meeting the stated rehab goals.  ?????      Scot Jun 04/15/2018, 11:41 AM  [place] [address] [place], [name], [address] Phone: [phone]   Fax:  [phone]  Name: Sabrina Ramirez MRN: [id number] Date of Birth: 02-01-1941

## 2018-04-27 ENCOUNTER — Ambulatory Visit
Admission: RE | Admit: 2018-04-27 | Discharge: 2018-04-27 | Disposition: A | Discharge status: Home / Self Care | Payer: Medicare Other | Source: Ambulatory Visit | Attending: Family Medicine | Admitting: Family Medicine

## 2018-04-27 DIAGNOSIS — E2839 Other primary ovarian failure: Secondary | ICD-10-CM

## 2018-05-28 ENCOUNTER — Other Ambulatory Visit: Payer: Self-pay | Admitting: Family Medicine

## 2018-08-10 ENCOUNTER — Encounter: Payer: Self-pay | Admitting: Family Medicine

## 2018-08-11 ENCOUNTER — Ambulatory Visit (INDEPENDENT_AMBULATORY_CARE_PROVIDER_SITE_OTHER): Payer: Medicare PPO | Admitting: Physician Assistant

## 2018-08-11 ENCOUNTER — Other Ambulatory Visit: Payer: Self-pay

## 2018-08-11 ENCOUNTER — Encounter: Payer: Self-pay | Admitting: Physician Assistant

## 2018-08-11 VITALS — BP 114/70 | HR 68 | Temp 97.8°F | Resp 14 | Ht 64.0 in | Wt 197.0 lb

## 2018-08-11 DIAGNOSIS — M109 Gout, unspecified: Secondary | ICD-10-CM | POA: Diagnosis not present

## 2018-08-11 MED ORDER — PREDNISONE 10 MG PO TABS: ORAL_TABLET | ORAL | 0 refills | Status: AC

## 2018-08-11 NOTE — Patient Instructions (Signed)
Take the prednisone as directed. Ice to help with warmth and redness.  Follow recommendations below.  Let us know if not resolving.  Gout  Gout is painful swelling of your joints. Gout is a type of arthritis. It is caused by having too much uric acid in your body. Uric acid is a chemical that is made when your body breaks down substances called purines. If your body has too much uric acid, sharp crystals can form and build up in your joints. This causes pain and swelling. Gout attacks can happen quickly and be very painful (acute gout). Over time, the attacks can affect more joints and happen more often (chronic gout). What are the causes?  Too much uric acid in your blood. This can happen because: ? Your kidneys do not remove enough uric acid from your blood. ? Your body makes too much uric acid. ? You eat too many foods that are high in purines. These foods include organ meats, some seafood, and beer.  Trauma or stress. What increases the risk?  Having a family history of gout.  Being female and middle-aged.  Being female and having gone through menopause.  Being very overweight (obese).  Drinking alcohol, especially beer.  Not having enough water in the body (being dehydrated).  Losing weight too quickly.  Having an organ transplant.  Having lead poisoning.  Taking certain medicines.  Having kidney disease.  Having a skin condition called psoriasis. What are the signs or symptoms? An attack of acute gout usually happens in just one joint. The most common place is the big toe. Attacks often start at night. Other joints that may be affected include joints of the feet, ankle, knee, fingers, wrist, or elbow. Symptoms of an attack may include:  Very bad pain.  Warmth.  Swelling.  Stiffness.  Shiny, red, or purple skin.  Tenderness. The affected joint may be very painful to touch.  Chills and fever. Chronic gout may cause symptoms more often. More joints may be  involved. You may also have white or yellow lumps (tophi) on your hands or feet or in other areas near your joints. How is this treated?  Treatment for this condition has two phases: treating an acute attack and preventing future attacks.  Acute gout treatment may include: ? NSAIDs. ? Steroids. These are taken by mouth or injected into a joint. ? Colchicine. This medicine relieves pain and swelling. It can be given by mouth or through an IV tube.  Preventive treatment may include: ? Taking small doses of NSAIDs or colchicine daily. ? Using a medicine that reduces uric acid levels in your blood. ? Making changes to your diet. You may need to see a food expert (dietitian) about what to eat and drink to prevent gout. Follow these instructions at home: During a gout attack   If told, put ice on the painful area: ? Put ice in a plastic bag. ? Place a towel between your skin and the bag. ? Leave the ice on for 20 minutes, 2-3 times a Mikel.  Raise (elevate) the painful joint above the level of your heart as often as you can.  Rest the joint as much as possible. If the joint is in your leg, you may be given crutches.  Follow instructions from your doctor about what you cannot eat or drink. Avoiding future gout attacks  Eat a low-purine diet. Avoid foods and drinks such as: ? Liver. ? Kidney. ? Anchovies. ? Asparagus. ? Herring. ? Mushrooms. ?  Mussels. ? Beer.  Stay at a healthy weight. If you want to lose weight, talk with your doctor. Do not lose weight too fast.  Start or continue an exercise plan as told by your doctor. Eating and drinking  Drink enough fluids to keep your pee (urine) pale yellow.  If you drink alcohol: ? Limit how much you use to:  0-1 drink a [place] for women.  0-2 drinks a St[place] for men. ? Be aware of how much alcohol is in your drink. In the [place] one drink equals one 12 oz bottle of beer (355 mL), one 5 oz glass of wine (148 mL), or one 1 oz glass of  hard liquor (44 mL). General instructions  Take over-the-counter and prescription medicines only as told by your doctor.  Do not drive or use heavy machinery while taking prescription pain medicine.  Return to your normal activities as told by your doctor. Ask your doctor what activities are safe for you.  Keep all follow-up visits as told by your doctor. This is important. Contact a doctor if:  You have another gout attack.  You still have symptoms of a gout attack after 10 days of treatment.  You have problems (side effects) because of your medicines.  You have chills or a fever.  You have burning pain when you pee (urinate).  You have pain in your lower back or belly. Get help right away if:  You have very bad pain.  Your pain cannot be controlled.  You cannot pee. Summary  Gout is painful swelling of the joints.  The most common site of pain is the big toe, but it can affect other joints.  Medicines and avoiding some foods can help to prevent and treat gout attacks. This information is not intended to replace advice given to you by your health care provider. Make sure you discuss any questions you have with your health care provider. Document Released: 03/04/2008 Document Revised: 12/16/2017 Document Reviewed: 12/16/2017 Elsevier Interactive Patient Education  2019 [employer].

## 2018-08-11 NOTE — Progress Notes (Signed)
Patient presents to clinic today c/o 3 days of redness, warmth pain and swelling of L great toe. Notes it is improving but still painful. Denies trauma or injury. Denies fever, chills, decreased ROM. Denies trauma or injury. Denies history of gout. Has had increased intake of shellfish recently. Patient with CKD III.Marland Kitchen   Past Medical History:  Diagnosis Date  . Allergic asthma, mild intermittent, uncomplicated 76/22/6333   Dr. Donneta Romberg, allergist  . Arthritis   . Chronic kidney disease    Stage III kidney disease  . GERD (gastroesophageal reflux disease)   . Hemorrhoids   . History of kidney stones   . Hypertension   . IBS (irritable bowel syndrome)   . Psoriasis     Current Outpatient Medications on File Prior to Visit  Medication Sig Dispense Refill  . Albuterol Sulfate (PROAIR RESPICLICK) 545 (90 Base) MCG/ACT AEPB Inhale 1-2 puffs into the lungs every 6 (six) hours as needed (for wheezing/shortness of breath).     Marland Kitchen aspirin EC 81 MG tablet Take 1 tablet (81 mg total) by mouth at bedtime.    Marland Kitchen azelastine (ASTELIN) 0.1 % nasal spray Place 1 spray into the nose daily as needed (for allergies.).     Marland Kitchen cetirizine (ZYRTEC) 10 MG tablet Take 10 mg by mouth daily.    . Cholecalciferol (VITAMIN D3) 2000 units TABS Take 2,000 Units by mouth daily.    Marland Kitchen CINNAMON PO Take 6.25 applicators by mouth daily. 1/4 teaspoon in coffee grounds    . EPINEPHrine (EPIPEN 2-PAK) 0.3 mg/0.3 mL IJ SOAJ injection As needed    . fluocinonide cream (LIDEX) 0.05 % APPLY SPARINGLY  TO AFFECTED AREA THREE TIMES DAILY for psoriasis flares. Do not use longer than 2 weeks. 30 g 5  . fluticasone furoate-vilanterol (BREO ELLIPTA) 200-25 MCG/INH AEPB Inhale 1 puff into the lungs daily.    . Glucosamine-Chondroitin (COSAMIN DS PO) Take 2 tablets by mouth daily.    . Guaifenesin 1200 MG TB12 Take 1 tablet by mouth as needed.    . metoprolol succinate (TOPROL-XL) 100 MG 24 hr tablet Take 1 tablet (100 mg total) by mouth  daily. 90 tablet 3  . montelukast (SINGULAIR) 10 MG tablet Take 10 mg by mouth at bedtime.     . Multiple Vitamin (MULTIVITAMIN) tablet Take 1 tablet by mouth daily. Senior Multivitamin    . omeprazole (PRILOSEC) 20 MG capsule Take 20 mg by mouth daily.    . rosuvastatin (CRESTOR) 5 MG tablet TAKE 1/2 (ONE-HALF) TABLET BY MOUTH AT BEDTIME 30 tablet 0  . triamcinolone cream (KENALOG) 0.1 % Apply 1 application topically 2 (two) times daily as needed (for skin rash (Summertime)).     . Turmeric POWD Take by mouth.     No current facility-administered medications on file prior to visit.     Allergies  Allergen Reactions  . Amlodipine Swelling  . Flexeril [Cyclobenzaprine] Other (See Comments)    Hallucinations  . Ace Inhibitors Cough  . Adhesive [Tape] Rash    PAPER TAPE OK  . Angiotensin Receptor Blockers Cough  . Corn-Containing Products Other (See Comments)    PLEASE BE ADVISED HIGH ALERT FOR GLUCOSE DRIPS/POPSICLES/JELLO FOR SWEETENERS WITH CORN   HEADACHES/RUNNY NOSE/GI UPSET/LARGE INTESTINE GROWTH/STOMACH CRAMPING    . Iodine Rash  . Triamterene-Hctz Cough    Family History  Problem Relation Age of Onset  . Stroke Mother   . Diabetes Father   . Heart attack Father   . Heart disease Father   .  Hyperlipidemia Father   . Hypertension Father   . Stroke Father   . Diabetes Sister   . Hypertension Sister   . Transient ischemic attack Sister   . COPD Maternal Grandmother   . Heart disease Maternal Grandmother   . Deep vein thrombosis Maternal Grandfather   . Stroke Maternal Grandfather   . Deep vein thrombosis Paternal Grandmother   . Hypertension Paternal Grandmother   . Hypertension Daughter   . Arthritis Maternal Aunt   . COPD Maternal Aunt   . Hypertension Maternal Aunt   . Diabetes Paternal Uncle   . Heart disease Paternal Uncle   . Stroke Paternal Uncle   . Breast cancer Neg Hx     Social History   Socioeconomic History  . Marital status: Divorced     Spouse name: Not on file  . Number of children: Not on file  . Years of education: Not on file  . Highest education level: Not on file  Occupational History  . Not on file  Social Needs  . Financial resource strain: Not on file  . Food insecurity:    Worry: Not on file    Inability: Not on file  . Transportation needs:    Medical: Not on file    Non-medical: Not on file  Tobacco Use  . Smoking status: Never Smoker  . Smokeless tobacco: Never Used  Substance and Sexual Activity  . Alcohol use: Yes    Comment: occassionally  . Drug use: No  . Sexual activity: Never  Lifestyle  . Physical activity:    Days per week: Not on file    Minutes per session: Not on file  . Stress: Not on file  Relationships  . Social connections:    Talks on phone: Not on file    Gets together: Not on file    Attends religious service: Not on file    Active member of club or organization: Not on file    Attends meetings of clubs or organizations: Not on file    Relationship status: Not on file  Other Topics Concern  . Not on file  Social History Narrative  . Not on file   Review of Systems - See HPI.  All other ROS are negative.  BP 114/70   Pulse 68   Temp 97.8 F (36.6 C) (Oral)   Resp 14   Ht 5\' 4"  (1.626 m)   Wt 197 lb (89.4 kg)   SpO2 98%   BMI 33.81 kg/m   Physical Exam Vitals signs reviewed.  Constitutional:      Appearance: Normal appearance.  HENT:     Head: Normocephalic and atraumatic.  Eyes:     Conjunctiva/sclera: Conjunctivae normal.  Neck:     Musculoskeletal: Normal range of motion.  Cardiovascular:     Rate and Rhythm: Normal rate and regular rhythm.     Pulses: Normal pulses.     Heart sounds: Normal heart sounds.  Pulmonary:     Effort: Pulmonary effort is normal.  Musculoskeletal:       Feet:  Neurological:     Mental Status: She is alert.  Psychiatric:        Mood and Affect: Mood normal.      Assessment/Plan: 1. Podagra Improving. ROM  normal but painful. Start Prednisone taper. Supportive measures discussed. Handout given on dietary triggers to review.    Leeanne Rio, PA-C

## 2018-08-17 ENCOUNTER — Other Ambulatory Visit: Payer: Self-pay | Admitting: Family Medicine

## 2018-08-30 ENCOUNTER — Ambulatory Visit: Payer: Medicare Other | Admitting: Family Medicine

## 2018-09-03 ENCOUNTER — Ambulatory Visit: Payer: Medicare Other | Admitting: Family Medicine

## 2018-10-18 ENCOUNTER — Telehealth: Payer: Self-pay | Admitting: Family Medicine

## 2018-10-18 MED ORDER — ROSUVASTATIN CALCIUM 5 MG PO TABS: ORAL_TABLET | ORAL | 1 refills | Status: DC

## 2018-10-18 NOTE — Telephone Encounter (Signed)
Copied from [place] [id number]. Topic: Quick Communication - Rx Refill/Question >> Oct 18, 2018  8:52 AM Leward Quan A wrote: Medication: rosuvastatin (CRESTOR) 5 MG tablet   Has the patient contacted their pharmacy? Yes.   (Agent: If no, request that the patient contact the pharmacy for the refill.) (Agent: If yes, when and what did the pharmacy advise?)  Preferred Pharmacy (with phone number or street name): [facility] ([address] [place] [phone] (Phone) [phone] (Fax    Agent: Please be advised that RX refills may take up to 3 business days. We ask that you follow-up with your pharmacy.

## 2018-10-18 NOTE — Telephone Encounter (Signed)
See request °

## 2018-10-18 NOTE — Telephone Encounter (Signed)
Pt aware RX renewed and scheduled for 5/12

## 2018-10-19 ENCOUNTER — Ambulatory Visit (INDEPENDENT_AMBULATORY_CARE_PROVIDER_SITE_OTHER): Payer: Medicare PPO | Admitting: Family Medicine

## 2018-10-19 ENCOUNTER — Encounter: Payer: Self-pay | Admitting: Family Medicine

## 2018-10-19 ENCOUNTER — Other Ambulatory Visit: Payer: Self-pay

## 2018-10-19 DIAGNOSIS — E669 Obesity, unspecified: Secondary | ICD-10-CM

## 2018-10-19 DIAGNOSIS — E781 Pure hyperglyceridemia: Secondary | ICD-10-CM | POA: Diagnosis not present

## 2018-10-19 DIAGNOSIS — N183 Chronic kidney disease, stage 3 unspecified: Secondary | ICD-10-CM

## 2018-10-19 DIAGNOSIS — I10 Essential (primary) hypertension: Secondary | ICD-10-CM | POA: Diagnosis not present

## 2018-10-19 NOTE — Assessment & Plan Note (Signed)
Monitor and eat healthy low fat diet

## 2018-10-19 NOTE — Assessment & Plan Note (Signed)
Well controlled clinically; continue same. rec low salt diet and weight loss if able. Return in 4 months for recheck with labs.

## 2018-10-19 NOTE — Progress Notes (Signed)
Virtual Visit via Video Note  Subjective  CC:  Chief Complaint  Patient presents with  . Hypertension  . Hyperlipidemia  . Chronic Kidney Disease     I connected with Sabrina Ramirez on 10/19/18 at  9:40 AM EDT by a video enabled telemedicine application and verified that I am speaking with the correct person using two identifiers. Location patient: Home Location provider: [facility] [place] [facility] participating in the virtual visit: Sabrina Ramirez, Sabrina Arnt, MD [place]illi [place], [place] discussed the limitations of evaluation and management by telemedicine and the availability of in person appointments. The patient expressed understanding and agreed to proceed. HPI: Sabrina Ramirez is a 78 y.o. female who was contacted today to address the problems listed above in the chief complaint/HTN f/u:   Hypertension f/u: Control is good . Pt reports she is doing well. taking medications as instructed, no medication side effects noted, no TIAs, no chest pain on exertion, no dyspnea on exertion, no swelling of ankles. Doesn't check bps at home. Tolerating all meds.  She denies adverse effects from his BP medications. Compliance with medication is good.   HLD on meds w/o problems. Has been well controlled.   Obesity: reports weight is stable.   Asthma per Dr. Donneta Romberg; much improved on breo.   BP Readings from Last 3 Encounters:  08/11/18 114/70  03/01/18 136/78  12/01/17 120/80   Wt Readings from Last 3 Encounters:  08/11/18 197 lb (89.4 kg)  03/01/18 196 lb 6.4 oz (89.1 kg)  12/01/17 191 lb 6.4 oz (86.8 kg)    Lab Results  Component Value Date   CHOL 136 03/01/2018   CHOL 190 12/01/2017   Lab Results  Component Value Date   HDL 49.20 03/01/2018   HDL 48.10 12/01/2017   HDL 45 01/23/2017   Lab Results  Component Value Date   LDLCALC 52 03/01/2018   LDLCALC 111 (H) 12/01/2017   Lab Results  Component Value Date   TRIG 178.0 (H) 03/01/2018    TRIG 158.0 (H) 12/01/2017   Lab Results  Component Value Date   CHOLHDL 3 03/01/2018   CHOLHDL 4 12/01/2017   No results found for: LDLDIRECT Lab Results  Component Value Date   CREATININE 1.67 (H) 03/01/2018   BUN 38 (H) 03/01/2018   NA 140 03/01/2018   K 4.2 03/01/2018   CL 106 03/01/2018   CO2 26 03/01/2018    The 10-year ASCVD risk score Mikey Bussing DC Jr., et al., 2013) is: 20.6%   Values used to calculate the score:     Age: 38 years     Sex: Female     Is Non-Hispanic African American: No     Diabetic: No     Tobacco smoker: No     Systolic Blood Pressure: 294 mmHg     Is BP treated: Yes     HDL Cholesterol: 49.2 mg/dL     Total Cholesterol: 136 mg/dL  Assessment  1. Benign essential hypertension   2. CKD (chronic kidney disease) stage 3, GFR 30-59 ml/min (HCC)   3. Hypertriglyceridemia   4. Obesity, Class I, BMI 30-34.9      Plan   See below for problem based assessment and plan documentation  I discussed the assessment and treatment plan with the patient. The patient was provided an opportunity to ask questions and all were answered. The patient agreed with the plan and demonstrated an understanding of  the instructions.   The patient was advised to call back or seek an in-person evaluation if the symptoms worsen or if the condition fails to improve as anticipated. Follow up: Return in about 4 months (around 02/19/2019) for complete physical, follow up Hypertension.  Visit date not found  No orders of the defined types were placed in this encounter.     I reviewed the patients updated PMH, FH, and SocHx.    Patient Active Problem List   Diagnosis Date Noted  . Obesity, Class I, BMI 30-34.9 04/16/2015    Priority: High  . Hypertriglyceridemia 09/22/2011    Priority: High  . Benign essential hypertension 05/23/2010    Priority: High  . CKD (chronic kidney disease) stage 3, GFR 30-59 ml/min (HCC) 05/23/2010    Priority: High  . On statin therapy due to  risk of future cardiovascular event 12/01/2017    Priority: Medium  . Spondylolisthesis at L5-S1 level 08/13/2017    Priority: Medium  . Colon polyp 05/25/2017    Priority: Medium  . Ophthalmic migraine 05/25/2017    Priority: Medium  . Essential tremor 05/25/2017    Priority: Medium  . Allergic asthma, mild intermittent, uncomplicated 99/35/7017    Priority: Medium  . Irritable bowel syndrome with diarrhea 07/08/2010    Priority: Medium  . Chronic allergic rhinitis 02/25/2010    Priority: Medium  . GERD (gastroesophageal reflux disease) 02/25/2010    Priority: Medium  . Osteoarthritis, multiple sites 06/12/2009    Priority: Medium  . Conjunctivitis, allergic, chronic 10/01/2012    Priority: Low  . Rosacea 06/25/2011    Priority: Low  . Psoriasis 02/25/2010    Priority: Low   Current Meds  Medication Sig  . Albuterol Sulfate (PROAIR RESPICLICK) 793 (90 Base) MCG/ACT AEPB Inhale 1-2 puffs into the lungs every 6 (six) hours as needed (for wheezing/shortness of breath).   Marland Kitchen aspirin EC 81 MG tablet Take 1 tablet (81 mg total) by mouth at bedtime.  Marland Kitchen azelastine (ASTELIN) 0.1 % nasal spray Place 1 spray into the nose daily as needed (for allergies.).   Marland Kitchen cetirizine (ZYRTEC) 10 MG tablet Take 10 mg by mouth daily.  . Cholecalciferol (VITAMIN D3) 2000 units TABS Take 2,000 Units by mouth daily.  Marland Kitchen CINNAMON PO Take 9.03 applicators by mouth daily. 1/4 teaspoon in coffee grounds  . fluocinonide cream (LIDEX) 0.05 % APPLY SPARINGLY  TO AFFECTED AREA THREE TIMES DAILY for psoriasis flares. Do not use longer than 2 weeks.  . fluticasone furoate-vilanterol (BREO ELLIPTA) 200-25 MCG/INH AEPB Inhale 1 puff into the lungs daily.  . Glucosamine-Chondroitin (COSAMIN DS PO) Take 2 tablets by mouth daily.  . metoprolol succinate (TOPROL-XL) 100 MG 24 hr tablet Take 1 tablet (100 mg total) by mouth daily.  . montelukast (SINGULAIR) 10 MG tablet Take 10 mg by mouth at bedtime.   . Multiple Vitamin  (MULTIVITAMIN) tablet Take 1 tablet by mouth daily. Senior Multivitamin  . omeprazole (PRILOSEC) 20 MG capsule Take 20 mg by mouth daily.  . rosuvastatin (CRESTOR) 5 MG tablet TAKE 1/2 (ONE-HALF) TABLET BY MOUTH AT BEDTIME  . triamcinolone cream (KENALOG) 0.1 % Apply 1 application topically 2 (two) times daily as needed (for skin rash (Summertime)).   . Turmeric POWD Take by mouth.    Allergies: Patient is allergic to amlodipine; flexeril [cyclobenzaprine]; ace inhibitors; adhesive [tape]; angiotensin receptor blockers; corn-containing products; iodine; and triamterene-hctz. Family History: Patient family history includes Arthritis in her maternal aunt; COPD in her maternal aunt  and maternal grandmother; Deep vein thrombosis in her maternal grandfather and paternal grandmother; Diabetes in her father, paternal uncle, and sister; Heart attack in her father; Heart disease in her father, maternal grandmother, and paternal uncle; Hyperlipidemia in her father; Hypertension in her daughter, father, maternal aunt, paternal grandmother, and sister; Stroke in her father, maternal grandfather, mother, and paternal uncle; Transient ischemic attack in her sister. Social History:  Patient  reports that she has never smoked. She has never used smokeless tobacco. She reports current alcohol use. She reports that she does not use drugs.  Review of Systems: Constitutional: Negative for fever malaise or anorexia Cardiovascular: negative for chest pain, orhtopnea, LE edema Respiratory: negative for SOB or persistent cough Gastrointestinal: negative for abdominal pain, nausea or vomiting  OBJECTIVE Vitals: There were no vitals taken for this visit. General: no acute distress , A&Ox3 Appears well  Sabrina Arnt, MD

## 2018-10-19 NOTE — Assessment & Plan Note (Signed)
Stable clinically; defer lab for 4 months.

## 2018-10-19 NOTE — Assessment & Plan Note (Signed)
Continue cholesterol lowering meds.

## 2018-11-22 ENCOUNTER — Other Ambulatory Visit: Payer: Self-pay | Admitting: Family Medicine

## 2018-12-28 ENCOUNTER — Telehealth: Payer: Medicare PPO | Admitting: Family

## 2018-12-28 DIAGNOSIS — M549 Dorsalgia, unspecified: Secondary | ICD-10-CM

## 2018-12-28 NOTE — Progress Notes (Signed)
Based on what you shared with me, I feel your condition warrants further evaluation and I recommend that you be seen for a face to face office visit.  NOTE: If you entered your credit card information for this eVisit, you will not be charged. You may see a "hold" on your card for the $35 but that hold will drop off and you will not have a charge processed.  If you are having a true medical emergency please call 911.     For an urgent face to face visit, [place] has five urgent care centers for your convenience:    [email or web address] to reserve your spot online an avoid wait times  Sabrina Ramirez (New Address!) [address], [place], [place] [address] *Just off [address], across the road from [place] hours of operation: Monday-Friday, 12 PM to 6 PM  Closed Saturday & Sunday   The following sites will take your insurance:  . [facility]    [phone]                  Get Driving Directions  [address], [place] [address] . 10 am to 8 pm Monday-Friday . 12 pm to 8 pm Saturday-Sunday   . [facility] at [place]                  Get Driving Directions  [address], [place], [place] [address] . 8 am to 8 pm Monday-Friday . 9 am to 6 pm Saturday . 11 am to 6 pm Sunday   . [facility] at [place]                  Get Driving Directions   [address], [place] [address] . 8 am to 8 pm Monday-Friday . 8 am to 4 pm Saturday-Sunday    . [facility] [place]                    Get Driving Directions  [phone]  [address], [place], [place] [address]  . Monday-Friday, 12 PM to 6 PM    Your e-visit answers were reviewed by a board certified advanced clinical practitioner to complete your personal care plan.  Thank you for using e-Visits.

## 2018-12-29 ENCOUNTER — Encounter: Payer: Self-pay | Admitting: Family Medicine

## 2018-12-29 ENCOUNTER — Other Ambulatory Visit: Payer: Self-pay

## 2018-12-29 ENCOUNTER — Ambulatory Visit (INDEPENDENT_AMBULATORY_CARE_PROVIDER_SITE_OTHER): Payer: Medicare PPO | Admitting: Family Medicine

## 2018-12-29 VITALS — BP 126/70 | HR 60 | Temp 97.9°F | Resp 16 | Ht 64.0 in | Wt 192.6 lb

## 2018-12-29 DIAGNOSIS — S39012A Strain of muscle, fascia and tendon of lower back, initial encounter: Secondary | ICD-10-CM

## 2018-12-29 MED ORDER — DICLOFENAC SODIUM 75 MG PO TBEC: 75.0000 mg | DELAYED_RELEASE_TABLET | Freq: Two times a day (BID) | ORAL | 0 refills | Status: DC

## 2018-12-29 MED ORDER — METHOCARBAMOL 500 MG PO TABS: 500.0000 mg | ORAL_TABLET | Freq: Two times a day (BID) | ORAL | 0 refills | Status: DC | PRN

## 2018-12-29 NOTE — Progress Notes (Signed)
Subjective  CC:  Chief Complaint  Patient presents with  . Back Pain    Had an E-Visit 7/21 was told to come in for further evalutation.. Pain started Friday in the mid-upper back, she has not tried anything for pain,and denies any injury and urinary symptoms    HPI: Sabrina Ramirez is a 78 y.o. female who presents to the office today to address the problems listed above in the chief complaint.  Sabrina Ramirez presents with 4-[place] history of right mid to low back pain.  It started the [place] after she lifted a heavy pot and plantar moved across her backyard.  She describes the pain as dull and aching although with certain movements it will be sharp.  Nonradiating pain without rash.  She has history of kidney stones so is worried about that but denies colicky pain, hematuria or urinary irritative symptoms.  She has no fever chill or malaise.  Appetite is good.  No GI symptoms.  She has not taken anything for the pain.  It does not affect her sleep.  If she sits still, she has no pain.  No weakness in the legs, bowel or bladder dysfunction or neuropathic pain  Assessment  1. Lumbar strain, initial encounter      Plan   Lumbar strain: Reassured, no red flag symptoms present.  Treat with NSAIDs and muscle relaxer.  Heat and back exercises demonstrated in the office.  Follow-up if not improved  Follow up: September for complete physical Visit date not found  No orders of the defined types were placed in this encounter.  Meds ordered this encounter  Medications  . diclofenac (VOLTAREN) 75 MG EC tablet    Sig: Take 1 tablet (75 mg total) by mouth 2 (two) times daily.    Dispense:  30 tablet    Refill:  0  . methocarbamol (ROBAXIN) 500 MG tablet    Sig: Take 1 tablet (500 mg total) by mouth 2 (two) times daily as needed for muscle spasms.    Dispense:  30 tablet    Refill:  0      I reviewed the patients updated PMH, FH, and SocHx.    Patient Active Problem List   Diagnosis Date Noted  .  Obesity, Class I, BMI 30-34.9 04/16/2015    Priority: High  . Hypertriglyceridemia 09/22/2011    Priority: High  . Benign essential hypertension 05/23/2010    Priority: High  . CKD (chronic kidney disease) stage 3, GFR 30-59 ml/min (HCC) 05/23/2010    Priority: High  . On statin therapy due to risk of future cardiovascular event 12/01/2017    Priority: Medium  . Spondylolisthesis at L5-S1 level 08/13/2017    Priority: Medium  . Colon polyp 05/25/2017    Priority: Medium  . Ophthalmic migraine 05/25/2017    Priority: Medium  . Essential tremor 05/25/2017    Priority: Medium  . Allergic asthma, mild intermittent, uncomplicated 35/00/9381    Priority: Medium  . Irritable bowel syndrome with diarrhea 07/08/2010    Priority: Medium  . Chronic allergic rhinitis 02/25/2010    Priority: Medium  . GERD (gastroesophageal reflux disease) 02/25/2010    Priority: Medium  . Osteoarthritis, multiple sites 06/12/2009    Priority: Medium  . Conjunctivitis, allergic, chronic 10/01/2012    Priority: Low  . Rosacea 06/25/2011    Priority: Low  . Psoriasis 02/25/2010    Priority: Low   Current Meds  Medication Sig  . Albuterol Sulfate (PROAIR RESPICLICK) 829 (90  Base) MCG/ACT AEPB Inhale 1-2 puffs into the lungs every 6 (six) hours as needed (for wheezing/shortness of breath).   Marland Kitchen aspirin EC 81 MG tablet Take 1 tablet (81 mg total) by mouth at bedtime.  Marland Kitchen azelastine (ASTELIN) 0.1 % nasal spray Place 1 spray into the nose daily as needed (for allergies.).   Marland Kitchen cetirizine (ZYRTEC) 10 MG tablet Take 10 mg by mouth daily.  . Cholecalciferol (VITAMIN D3) 2000 units TABS Take 2,000 Units by mouth daily.  Marland Kitchen CINNAMON PO Take 9.41 applicators by mouth daily. 1/4 teaspoon in coffee grounds  . fluocinonide cream (LIDEX) 0.05 % APPLY SPARINGLY  TO AFFECTED AREA THREE TIMES DAILY for psoriasis flares. Do not use longer than 2 weeks.  . fluticasone furoate-vilanterol (BREO ELLIPTA) 200-25 MCG/INH AEPB Inhale  1 puff into the lungs daily.  . Glucosamine-Chondroitin (COSAMIN DS PO) Take 2 tablets by mouth daily.  . Guaifenesin 1200 MG TB12 Take 1 tablet by mouth as needed.  . metoprolol succinate (TOPROL-XL) 100 MG 24 hr tablet Take 1 tablet by mouth once daily  . montelukast (SINGULAIR) 10 MG tablet Take 10 mg by mouth at bedtime.   . Multiple Vitamin (MULTIVITAMIN) tablet Take 1 tablet by mouth daily. Senior Multivitamin  . omeprazole (PRILOSEC) 20 MG capsule Take 20 mg by mouth daily.  . rosuvastatin (CRESTOR) 5 MG tablet TAKE 1/2 (ONE-HALF) TABLET BY MOUTH AT BEDTIME  . triamcinolone cream (KENALOG) 0.1 % Apply 1 application topically 2 (two) times daily as needed (for skin rash (Summertime)).   . Turmeric POWD Take by mouth.    Allergies: Patient is allergic to amlodipine; flexeril [cyclobenzaprine]; ace inhibitors; adhesive [tape]; angiotensin receptor blockers; corn-containing products; iodine; and triamterene-hctz. Family History: Patient family history includes Arthritis in her maternal aunt; COPD in her maternal aunt and maternal grandmother; Deep vein thrombosis in her maternal grandfather and paternal grandmother; Diabetes in her father, paternal uncle, and sister; Heart attack in her father; Heart disease in her father, maternal grandmother, and paternal uncle; Hyperlipidemia in her father; Hypertension in her daughter, father, maternal aunt, paternal grandmother, and sister; Stroke in her father, maternal grandfather, mother, and paternal uncle; Transient ischemic attack in her sister. Social History:  Patient  reports that she has never smoked. She has never used smokeless tobacco. She reports current alcohol use. She reports that she does not use drugs.  Review of Systems: Constitutional: Negative for fever malaise or anorexia Cardiovascular: negative for chest pain Respiratory: negative for SOB or persistent cough Gastrointestinal: negative for abdominal pain  Objective  Vitals:  BP 126/70   Pulse 60   Temp 97.9 F (36.6 C) (Oral)   Resp 16   Ht 5\' 4"  (1.626 m)   Wt 192 lb 9.6 oz (87.4 kg)   SpO2 98%   BMI 33.06 kg/m  General: no acute distress , A&Ox3 moves easily, normal gait Back exam: Mild right lumbar paravertebral muscle spasm without significant tenderness, pain with extension, lateral flexion.  He has good forward flexion.  Bilateral lower extremities are strong and negative straight leg raise bilaterally.  No CVA tenderness    Commons side effects, risks, benefits, and alternatives for medications and treatment plan prescribed today were discussed, and the patient expressed understanding of the given instructions. Patient is instructed to call or message via MyChart if he/she has any questions or concerns regarding our treatment plan. No barriers to understanding were identified. We discussed Red Flag symptoms and signs in detail. Patient expressed understanding regarding what to  do in case of urgent or emergency type symptoms.   Medication list was reconciled, printed and provided to the patient in AVS. Patient instructions and summary information was reviewed with the patient as documented in the AVS. This note was prepared with assistance of Dragon voice recognition software. Occasional wrong-word or sound-a-like substitutions may have occurred due to the inherent limitations of voice recognition software

## 2018-12-29 NOTE — Patient Instructions (Signed)
Please return in September for your annual complete physical; please come fasting.   If you have any questions or concerns, please don't hesitate to send me a message via MyChart or call the office at [phone]. Thank you for visiting with [place] today! It's our pleasure caring for you.   Lumbar Strain A lumbar strain, which is sometimes called a low-back strain, is a stretch or tear in a muscle or the strong cords of tissue that attach muscle to bone (tendons) in the lower back (lumbar spine). This type of injury occurs when muscles or tendons are torn or are stretched beyond their limits. Lumbar strains can range from mild to severe. Mild strains may involve stretching a muscle or tendon without tearing it. These may heal in 1-2 weeks. More severe strains involve tearing of muscle fibers or tendons. These will cause more pain and may take 6-8 weeks to heal. What are the causes? This condition may be caused by:  Trauma, such as a fall or a hit to the body.  Twisting or overstretching the back. This may result from doing activities that need a lot of energy, such as lifting heavy objects. What increases the risk? This injury is more common in:  Athletes.  People with obesity.  People who do repeated lifting, bending, or other movements that involve their back. What are the signs or symptoms? Symptoms of this condition may include:  Sharp or dull pain in the lower back that does not go away. The pain may extend to the buttocks.  Stiffness or limited range of motion.  Sudden muscle tightening (spasms). How is this diagnosed? This condition may be diagnosed based on:  Your symptoms.  Your medical history.  A physical exam.  Imaging tests, such as: ? X-rays. ? MRI. How is this treated? Treatment for this condition may include:  Rest.  Applying heat and cold to the affected area.  Over-the-counter medicines to help relieve pain and inflammation, such as NSAIDs.   Prescription pain medicine and muscle relaxants may be needed for a short time.  Physical therapy. Follow these instructions at home: Managing pain, stiffness, and swelling      If directed, put ice on the injured area during the first 24 hours after your injury. ? Put ice in a plastic bag. ? Place a towel between your skin and the bag. ? Leave the ice on for 20 minutes, 2-3 times a Strahle.  If directed, apply heat to the affected area as often as told by your health care provider. Use the heat source that your health care provider recommends, such as a moist heat pack or a heating pad. ? Place a towel between your skin and the heat source. ? Leave the heat on for 20-30 minutes. ? Remove the heat if your skin turns bright red. This is especially important if you are unable to feel pain, heat, or cold. You may have a greater risk of getting burned. Activity  Rest and return to your normal activities as told by your health care provider. Ask your health care provider what activities are safe for you.  Do exercises as told by your health care provider. Medicines  Take over-the-counter and prescription medicines only as told by your health care provider.  Ask your health care provider if the medicine prescribed to you: ? Requires you to avoid driving or using heavy machinery. ? Can cause constipation. You may need to take these actions to prevent or treat constipation:  Drink enough  fluid to keep your urine pale yellow.  Take over-the-counter or prescription medicines.  Eat foods that are high in fiber, such as beans, whole grains, and fresh fruits and vegetables.  Limit foods that are high in fat and processed sugars, such as fried or sweet foods. Injury prevention To prevent a future low-back injury:  Always warm up properly before physical activity or sports.  Cool down and stretch after being active.  Use correct form when playing sports and lifting heavy objects. Bend your  knees before you lift heavy objects.  Use good posture when sitting and standing.  Stay physically fit and keep a healthy weight. ? Do at least 150 minutes of moderate-intensity exercise each week, such as brisk walking or water aerobics. ? Do strength exercises at least 2 times each week.  General instructions  Do not use any products that contain nicotine or tobacco, such as cigarettes, e-cigarettes, and chewing tobacco. If you need help quitting, ask your health care provider.  Keep all follow-up visits as told by your health care provider. This is important. Contact a health care provider if:  Your back pain does not improve after 6 weeks of treatment.  Your symptoms get worse. Get help right away if:  Your back pain is severe.  You are unable to stand or walk.  You develop pain in your legs.  You develop weakness in your buttocks or legs.  You have difficulty controlling when you urinate or when you have a bowel movement. ? You have frequent, painful, or bloody urination. ? You have a temperature over 101.48F (38.3C) Summary  A lumbar strain, which is sometimes called a low-back strain, is a stretch or tear in a muscle or the strong cords of tissue that attach muscle to bone (tendons) in the lower back (lumbar spine).  This type of injury occurs when muscles or tendons are torn or are stretched beyond their limits.  Rest and return to your normal activities as told by your health care provider. If directed, apply heat and ice to the affected area as often as told by your health care provider.  Take over-the-counter and prescription medicines only as told by your health care provider.  Contact a health care provider if you have new or worsening symptoms. This information is not intended to replace advice given to you by your health care provider. Make sure you discuss any questions you have with your health care provider. Document Released: 05/26/2005 Document Revised:  03/25/2018 Document Reviewed: 03/25/2018 Elsevier Patient Education  2020 [employer].

## 2019-01-06 ENCOUNTER — Encounter: Payer: Self-pay | Admitting: Internal Medicine

## 2019-01-06 ENCOUNTER — Ambulatory Visit: Payer: Self-pay | Admitting: *Deleted

## 2019-01-06 ENCOUNTER — Ambulatory Visit (INDEPENDENT_AMBULATORY_CARE_PROVIDER_SITE_OTHER): Payer: Medicare PPO | Admitting: Internal Medicine

## 2019-01-06 DIAGNOSIS — R509 Fever, unspecified: Secondary | ICD-10-CM | POA: Diagnosis not present

## 2019-01-06 NOTE — Telephone Encounter (Signed)
Message from Nils Flack sent at 01/06/2019 1:13 PM EDT  Summary: medication side effects?   Pt started taking diclofenac (VOLTAREN) 75 MG EC tablet and methocarbamol (ROBAXIN) 500 MG tablet last Thursday and on Friday she started having low grade fever ( 99-100) chills, fatigue, and no appetite. Pt is feeing better today and has no feer today but wants to see what is going on with this. Her daughter is asking she call.  Please call back           Called pt back regarding her symptoms mentioned above. She stated that she started the 2 new medications on Friday and on Saturday she developed a fever (T Max 101.3) She had chills, body aches, and fatigue. She stayed in bed pretty much the whole weekend. She had been at home and not going anywhere until last week she went to [employer]. She wears a mask when she is out and tries to social distance, have clean hands. Today her temperature is 98 and her O 2 sat is 97-98 %. Notify [facility] at [facility] regarding a virtual appointment. Call transferred to the office. Routing to the practice for review.   Reason for Disposition . HIGH RISK patient (e.g., age > 70 years, diabetes, heart or lung disease, weak immune system)  Answer Assessment - Initial Assessment Questions 1. COVID-19 DIAGNOSIS: "Who made your Coronavirus (COVID-19) diagnosis?" "Was it confirmed by a positive lab test?" If not diagnosed by a HCP, ask "Are there lots of cases (community spread) where you live?" (See public health department website, if unsure)     No confirmed 2. ONSET: "When did the COVID-19 symptoms start?"      On Saturday 3. WORST SYMPTOM: "What is your worst symptom?" (e.g., cough, fever, shortness of breath, muscle aches)     Chills and fever and exhaustion 4. COUGH: "Do you have a cough?" If so, ask: "How bad is the cough?"       no 5. FEVER: "Do you have a fever?" If so, ask: "What is your temperature, how was it measured, and when did it start?"  Yes was up to 101.3 but today 98 6. RESPIRATORY STATUS: "Describe your breathing?" (e.g., shortness of breath, wheezing, unable to speak)      No shortness of breath 7. BETTER-SAME-WORSE: "Are you getting better, staying the same or getting worse compared to yesterday?"  If getting worse, ask, "In what way?"     Feels better 8. HIGH RISK DISEASE: "Do you have any chronic medical problems?" (e.g., asthma, heart or lung disease, weak immune system, etc.)    Kidney disease and silent migraines and high blood pressure 9. PREGNANCY: "Is there any chance you are pregnant?" "When was your last menstrual period?"     no 10. OTHER SYMPTOMS: "Do you have any other symptoms?"  (e.g., chills, fatigue, headache, loss of smell or taste, muscle pain, sore throat)       Had chills, slight migraines, fatigue, little loss of taste and smell, diarrhea (has IBS)  Protocols used: CORONAVIRUS (COVID-19) DIAGNOSED OR SUSPECTED-A-AH

## 2019-01-06 NOTE — Assessment & Plan Note (Signed)
D/w pt, this is highly likely viral, etiology unclear, no clear covid exposure but lives alone except for shopping; advised pt for COVID testing tomorrow, cont tylenol and/or diclofenac prn, and  to f/u any worsening symptoms or concerns; if COVID neg could elect to be seen at her PCP if not improved or worsening, o/w advised [facility]

## 2019-01-06 NOTE — Progress Notes (Signed)
Patient ID: Sabrina Ramirez, female   DOB: 04/24/1941, 78 y.o.   MRN: [id number]  Virtual Visit via Video Note  I connected with Sabrina Ramirez on 01/06/19 at  7:20 PM EDT by a video enabled telemedicine application and verified that I am speaking with the correct person using two identifiers.  Location: Patient: at home Provider: at office   I discussed the limitations of evaluation and management by telemedicine and the availability of in person appointments. The patient expressed understanding and agreed to proceed.  History of Present Illness: Here with c/o febrile illnes x 2-3 days with temp up to 101.3 on diclofenac and robaxin for pulled muscle prn, chills, diarrhea, neck pain, fatigue but no cough, sob, wheezing, CP.  Maybe has some loss of taste and smell, but not sure.  Had blurry vision for 3 days for unclear reason improved today.  Has sat monitor at home and sats 96-97% today several times.  Denies worsening reflux, abd pain, dysphagia, n/v, or blood.  Denies urinary symptoms such as dysuria, frequency, urgency, flank pain, hematuria or n/v, fever, chills.  Lives alone, no sick contacts, no known close covid exposure.  Past Medical History:  Diagnosis Date  . Allergic asthma, mild intermittent, uncomplicated 65/99/3570   Dr. Donneta Romberg, allergist  . Arthritis   . Chronic kidney disease    Stage III kidney disease  . GERD (gastroesophageal reflux disease)   . Hemorrhoids   . History of kidney stones   . Hypertension   . IBS (irritable bowel syndrome)   . Psoriasis    Past Surgical History:  Procedure Laterality Date  . ABDOMINAL HYSTERECTOMY  1987  . CATARACT EXTRACTION W/ INTRAOCULAR LENS  IMPLANT, BILATERAL Bilateral 2015  . CHOLECYSTECTOMY    . HEMORRHOID SURGERY    . SPINE SURGERY  07/10/2017  . TONSILLECTOMY      reports that she has never smoked. She has never used smokeless tobacco. She reports current alcohol use. She reports that she does not use drugs. family  history includes Arthritis in her maternal aunt; COPD in her maternal aunt and maternal grandmother; Deep vein thrombosis in her maternal grandfather and paternal grandmother; Diabetes in her father, paternal uncle, and sister; Heart attack in her father; Heart disease in her father, maternal grandmother, and paternal uncle; Hyperlipidemia in her father; Hypertension in her daughter, father, maternal aunt, paternal grandmother, and sister; Stroke in her father, maternal grandfather, mother, and paternal uncle; Transient ischemic attack in her sister. Allergies  Allergen Reactions  . Amlodipine Swelling  . Flexeril [Cyclobenzaprine] Other (See Comments)    Hallucinations  . Ace Inhibitors Cough  . Adhesive [Tape] Rash    PAPER TAPE OK  . Angiotensin Receptor Blockers Cough  . Corn-Containing Products Other (See Comments)    PLEASE BE ADVISED HIGH ALERT FOR GLUCOSE DRIPS/POPSICLES/JELLO FOR SWEETENERS WITH CORN   HEADACHES/RUNNY NOSE/GI UPSET/LARGE INTESTINE GROWTH/STOMACH CRAMPING    . Iodine Rash  . Triamterene-Hctz Cough   Observations/Objective: Alert, NAD, appropriate mood and affect, resps normal, cn 2-12 intact, moves all 4s, no visible rash or swelling Lab Results  Component Value Date   WBC 7.6 03/01/2018   HGB 12.8 03/01/2018   HCT 38.2 03/01/2018   PLT 205.0 03/01/2018   GLUCOSE 111 (H) 03/01/2018   CHOL 136 03/01/2018   TRIG 178.0 (H) 03/01/2018   HDL 49.20 03/01/2018   LDLCALC 52 03/01/2018   ALT 15 03/01/2018   AST 17 03/01/2018   NA 140 03/01/2018  K 4.2 03/01/2018   CL 106 03/01/2018   CREATININE 1.67 (H) 03/01/2018   BUN 38 (H) 03/01/2018   CO2 26 03/01/2018   TSH 1.89 03/01/2018   Assessment and Plan: See notes  Follow Up Instructions: See notes   I discussed the assessment and treatment plan with the patient. The patient was provided an opportunity to ask questions and all were answered. The patient agreed with the plan and demonstrated an  understanding of the instructions.   The patient was advised to call back or seek an in-person evaluation if the symptoms worsen or if the condition fails to improve as anticipated.   Cathlean Cower, MD

## 2019-01-06 NOTE — Patient Instructions (Signed)
Please go for COVID testing tomorrow  Please continue all other medications as before, and refills have been done if requested.  Please have the pharmacy call with any other refills you may need.  Please keep your appointments with your specialists as you may have planned

## 2019-01-07 ENCOUNTER — Other Ambulatory Visit: Payer: Self-pay

## 2019-01-07 DIAGNOSIS — R6889 Other general symptoms and signs: Secondary | ICD-10-CM | POA: Diagnosis not present

## 2019-01-07 DIAGNOSIS — Z20822 Contact with and (suspected) exposure to covid-19: Secondary | ICD-10-CM

## 2019-01-08 ENCOUNTER — Encounter: Payer: Self-pay | Admitting: Family Medicine

## 2019-01-09 LAB — NOVEL CORONAVIRUS, NAA: SARS-CoV-2, NAA: NOT DETECTED

## 2019-01-10 ENCOUNTER — Telehealth: Payer: Self-pay

## 2019-01-10 NOTE — Telephone Encounter (Signed)
-----   Message from Biagio Borg, MD sent at 01/09/2019  6:43 PM EDT ----- Left message on MyChart, pt to cont same tx   Shirron to please inform pt, COVID is neg

## 2019-01-10 NOTE — Telephone Encounter (Signed)
Pt has been informed of results and expressed understanding.  °

## 2019-01-19 ENCOUNTER — Encounter: Payer: Self-pay | Admitting: Family Medicine

## 2019-01-19 DIAGNOSIS — M545 Low back pain, unspecified: Secondary | ICD-10-CM

## 2019-01-20 ENCOUNTER — Encounter: Payer: Self-pay | Admitting: *Deleted

## 2019-01-20 ENCOUNTER — Telehealth: Payer: Self-pay

## 2019-01-20 ENCOUNTER — Telehealth: Payer: Self-pay | Admitting: Family Medicine

## 2019-01-20 NOTE — Telephone Encounter (Signed)
I will get that sent right now.  Thank you!

## 2019-01-20 NOTE — Telephone Encounter (Signed)
Please see below, referral was placed yesterday

## 2019-01-20 NOTE — Telephone Encounter (Signed)
Thanks

## 2019-01-20 NOTE — Telephone Encounter (Signed)
Copied from [place] [id number]. Topic: Referral - Request for Referral >> Jan 20, 2019  9:54 AM Rayann Heman wrote: Pt called and stated that she would like to have PT [place] [facility] at [facility]. Please see mychart message from 01/19/19. Please advise

## 2019-01-20 NOTE — Telephone Encounter (Signed)
error 

## 2019-01-20 NOTE — Telephone Encounter (Signed)
Message sent to pt with update.

## 2019-01-21 DIAGNOSIS — H43813 Vitreous degeneration, bilateral: Secondary | ICD-10-CM | POA: Diagnosis not present

## 2019-01-21 DIAGNOSIS — H26491 Other secondary cataract, right eye: Secondary | ICD-10-CM | POA: Diagnosis not present

## 2019-01-21 DIAGNOSIS — H3500 Unspecified background retinopathy: Secondary | ICD-10-CM | POA: Diagnosis not present

## 2019-01-21 DIAGNOSIS — H04123 Dry eye syndrome of bilateral lacrimal glands: Secondary | ICD-10-CM | POA: Diagnosis not present

## 2019-01-26 ENCOUNTER — Encounter: Payer: Self-pay | Admitting: Physical Therapy

## 2019-01-26 ENCOUNTER — Other Ambulatory Visit: Payer: Self-pay

## 2019-01-26 ENCOUNTER — Ambulatory Visit: Payer: Medicare PPO | Attending: Family Medicine | Admitting: Physical Therapy

## 2019-01-26 DIAGNOSIS — M62830 Muscle spasm of back: Secondary | ICD-10-CM

## 2019-01-26 DIAGNOSIS — M545 Low back pain, unspecified: Secondary | ICD-10-CM

## 2019-01-26 DIAGNOSIS — M6281 Muscle weakness (generalized): Secondary | ICD-10-CM | POA: Diagnosis not present

## 2019-01-26 DIAGNOSIS — R262 Difficulty in walking, not elsewhere classified: Secondary | ICD-10-CM | POA: Diagnosis not present

## 2019-01-26 NOTE — Therapy (Signed)
[facility] [address] [place], [name], [address] Phone: [phone]   Fax:  [phone]  Physical Therapy Evaluation  Patient Details  Name: Sabrina Ramirez MRN: [id number] Date of Birth: Jun 07, 1941 Referring Provider (PT): Billey Chang   Encounter Date: 01/26/2019  PT End of Session - 01/26/19 1519    Visit Number  1    Date for PT Re-Evaluation  03/28/19    Authorization Type  [employer]    PT Start Time  [id number]    PT Stop Time  1540    PT Time Calculation (min)  55 min    Activity Tolerance  Patient tolerated treatment well    Behavior During Therapy  [facility] for tasks assessed/performed       Past Medical History:  Diagnosis Date  . Allergic asthma, mild intermittent, uncomplicated 0000000   Dr. Donneta Romberg, allergist  . Arthritis   . Chronic kidney disease    Stage III kidney disease  . GERD (gastroesophageal reflux disease)   . Hemorrhoids   . History of kidney stones   . Hypertension   . IBS (irritable bowel syndrome)   . Psoriasis     Past Surgical History:  Procedure Laterality Date  . ABDOMINAL HYSTERECTOMY  1987  . CATARACT EXTRACTION W/ INTRAOCULAR LENS  IMPLANT, BILATERAL Bilateral 2015  . CHOLECYSTECTOMY    . HEMORRHOID SURGERY    . SPINE SURGERY  07/10/2017  . TONSILLECTOMY      There were no vitals filed for this visit.   Subjective Assessment - 01/26/19 1453    Subjective  Patient was seen here last year due to lumbar surgery in early 2019, she was able to return to yardwork after seeing [place] and did very well, she reports that about a month ago she lifted a large pot with her back and really has been in pain since that time.  She reports that she has left shoulder pain and is having difficulty doing her hair and dressing    Pertinent History  had a fusion 3 level early 2019    Limitations  Lifting;Standing;Walking;House hold activities    How long can you stand comfortably?  10 minutes  needs to rest    How long can you walk comfortably?  5-10 minutes    Diagnostic tests  none recently    Patient Stated Goals  have less pain, tolerate more activity    Currently in Pain?  Yes    Pain Score  3     Pain Location  Back   left shoulder   Pain Orientation  Lower;Right    Pain Descriptors / Indicators  Spasm;Aching;Dull    Pain Type  Acute pain    Pain Radiating Towards  denies    Pain Onset  More than a month ago    Pain Frequency  Constant    Aggravating Factors   standing, walking, bending and lifting pain up to 8-9/10 for the low back, the left shoulder hurts with reaching overhead and doing ehr hair, pain in the shoulder a 6-7/10    Pain Relieving Factors  lying down, rest, heat pain can be 0/10 at times    Effect of Pain on Daily Activities  difficulty walking, standing, cooking, housework, doing hair         [facility] PT Assessment - 01/26/19 0001      Assessment   Medical Diagnosis  right low back pain, left shoulder pain    Referring Provider (  PT)  Billey Chang    Onset Date/Surgical Date  12/26/18    Prior Therapy  a year ago for same with good results      Precautions   Precautions  None      Balance Screen   Has the patient fallen in the past 6 months  No    Has the patient had a decrease in activity level because of a fear of falling?   No    Is the patient reluctant to leave their home because of a fear of falling?   No      Home Environment   Additional Comments  does housework, yardwork, some stairs      Prior Function   Level of Independence  Independent    Vocation  Retired    Leisure  likes to garden      Mining engineer Comments  fwd head, rounded shoulders      ROM / Strength   AROM / PROM / Strength  AROM;Strength      AROM   Overall AROM Comments  Lumbar ROM decreased 50% for flexion, decreased 100% for extension and side bending with immediate pain, all left shoulder motions increase the pain    AROM Assessment  Site  Shoulder    Right/Left Shoulder  Left    Left Shoulder Flexion  140 Degrees   but really has to go through some odd motions to get there   Left Shoulder ABduction  130 Degrees    Left Shoulder Internal Rotation  60 Degrees    Left Shoulder External Rotation  60 Degrees      Strength   Overall Strength Comments  Hip strength 4-/5, left shoulder strength 4-/5 with left shoulder pain      Palpation   Palpation comment  she has significant spasms in the right low back mms and into the rhomboids, tender in the buttocks but does not report pain here      Special Tests   Other special tests  left shoulder empty can was negative      Transfers   Comments  has difficulty with transfers and bed mobility gaurded with pain                Objective measurements completed on examination: See above findings.      [facility] Adult PT Treatment/Exercise - 01/26/19 0001      Modalities   Modalities  Electrical Stimulation;Moist Heat      Moist Heat Therapy   Number Minutes Moist Heat  15 Minutes    Moist Heat Location  Lumbar Spine;Cervical      Electrical Stimulation   Electrical Stimulation Location  right low back area    Electrical Stimulation Action  IFC    Electrical Stimulation Parameters  supine    Electrical Stimulation Goals  Pain             PT Education - 01/26/19 1519    Education Details  Wms flexion    Person(s) Educated  Patient    Methods  Explanation;Demonstration;Handout    Comprehension  Verbalized understanding       PT Short Term Goals - 01/26/19 1529      PT SHORT TERM GOAL #1   Title  Patient independent with HEP    Time  2    Period  Weeks    Status  New        PT Long Term Goals - 01/26/19 1529  PT LONG TERM GOAL #1   Title  Patient able to walk to grocery shop without pain >4/10    Time  8    Period  Weeks    Status  New      PT LONG TERM GOAL #2   Title  stand to cook meals wihtout sitting to rest    Time  8     Period  Weeks    Status  New      PT LONG TERM GOAL #3   Title  Patient able to ascend/descend stairs at church with minimal pain.    Time  8    Period  Weeks    Status  New      PT LONG TERM GOAL #4   Title  Patient to demo 5/5 BLE strength to improve function.    Time  8    Period  Weeks    Status  New             Plan - 01/26/19 1526    Clinical Impression Statement  Patient had a lumbar fusion in early 2019, we saw her here and she did great.  She reports that about a month ago she was lifting a potted plant and has had significant right low back pain and left shoulder pain since that time.  She has not had any x-rays performed.  She has limited ROM of the lumbar spine and the left shoulder.  She reports difficulty standing and walking, she has significant tightness and spasms in the right lumbar area.    Personal Factors and Comorbidities  Comorbidity 3+    Comorbidities  GERD, IBS, lumbar surgery    Stability/Clinical Decision Making  Stable/Uncomplicated    Clinical Decision Making  Low    Rehab Potential  Good    PT Frequency  2x / week    PT Duration  8 weeks    PT Treatment/Interventions  ADLs/Self Care Home Management;Cryotherapy;Electrical Stimulation;Iontophoresis 4mg /ml Dexamethasone;Moist Heat;Ultrasound;Therapeutic activities;Therapeutic exercise;Neuromuscular re-education;Patient/family education;Dry needling    PT Next Visit Plan  slowly start exercises    Consulted and Agree with Plan of Care  Patient       Patient will benefit from skilled therapeutic intervention in order to improve the following deficits and impairments:  Pain, Improper body mechanics, Postural dysfunction, Increased muscle spasms, Decreased activity tolerance, Decreased range of motion, Decreased strength, Difficulty walking, Impaired flexibility  Visit Diagnosis: 1. Acute right-sided low back pain without sciatica   2. Muscle weakness (generalized)   3. Muscle spasm of back   4.  Difficulty in walking, not elsewhere classified        Problem List Patient Active Problem List   Diagnosis Date Noted  . Febrile illness 01/06/2019  . On statin therapy due to risk of future cardiovascular event 12/01/2017  . Spondylolisthesis at L5-S1 level 08/13/2017  . Colon polyp 05/25/2017  . Ophthalmic migraine 05/25/2017  . Essential tremor 05/25/2017  . Allergic asthma, mild intermittent, uncomplicated 0000000  . Obesity, Class I, BMI 30-34.9 04/16/2015  . Conjunctivitis, allergic, chronic 10/01/2012  . Hypertriglyceridemia 09/22/2011  . Rosacea 06/25/2011  . Irritable bowel syndrome with diarrhea 07/08/2010  . Benign essential hypertension 05/23/2010  . CKD (chronic kidney disease) stage 3, GFR 30-59 ml/min (HCC) 05/23/2010  . Chronic allergic rhinitis 02/25/2010  . GERD (gastroesophageal reflux disease) 02/25/2010  . Psoriasis 02/25/2010  . Osteoarthritis, multiple sites 06/12/2009    Sumner Boast., PT 01/26/2019, 3:47 PM  [facility]  [address] [place] [place], [name], [address] Phone: [phone]   Fax:  [phone]  Name: Sabrina Ramirez MRN: [id number] Date of Birth: 08-Feb-1941

## 2019-02-03 ENCOUNTER — Ambulatory Visit: Payer: Medicare PPO | Admitting: Physical Therapy

## 2019-02-03 ENCOUNTER — Encounter: Payer: Self-pay | Admitting: Physical Therapy

## 2019-02-03 ENCOUNTER — Other Ambulatory Visit: Payer: Self-pay

## 2019-02-03 DIAGNOSIS — M62830 Muscle spasm of back: Secondary | ICD-10-CM

## 2019-02-03 DIAGNOSIS — M6281 Muscle weakness (generalized): Secondary | ICD-10-CM

## 2019-02-03 DIAGNOSIS — M545 Low back pain, unspecified: Secondary | ICD-10-CM

## 2019-02-03 DIAGNOSIS — R262 Difficulty in walking, not elsewhere classified: Secondary | ICD-10-CM

## 2019-02-03 NOTE — Therapy (Signed)
[facility] [address] [place], [name], [address] Phone: [phone]   Fax:  [phone]  Physical Therapy Treatment  Patient Details  Name: Sabrina Ramirez MRN: [id number] Date of Birth: Aug 28, 1940 Referring Provider (PT): Billey Chang   Encounter Date: 02/03/2019  PT End of Session - 02/03/19 [id number]    Visit Number  2    Date for PT Re-Evaluation  03/28/19    Authorization Type  [employer]    PT Start Time  [id number]    PT Stop Time  1503    PT Time Calculation (min)  56 min    Activity Tolerance  Patient tolerated treatment well    Behavior During Therapy  [facility] for tasks assessed/performed       Past Medical History:  Diagnosis Date  . Allergic asthma, mild intermittent, uncomplicated 0000000   Dr. Donneta Romberg, allergist  . Arthritis   . Chronic kidney disease    Stage III kidney disease  . GERD (gastroesophageal reflux disease)   . Hemorrhoids   . History of kidney stones   . Hypertension   . IBS (irritable bowel syndrome)   . Psoriasis     Past Surgical History:  Procedure Laterality Date  . ABDOMINAL HYSTERECTOMY  1987  . CATARACT EXTRACTION W/ INTRAOCULAR LENS  IMPLANT, BILATERAL Bilateral 2015  . CHOLECYSTECTOMY    . HEMORRHOID SURGERY    . SPINE SURGERY  07/10/2017  . TONSILLECTOMY      There were no vitals filed for this visit.  Subjective Assessment - 02/03/19 1621    Subjective  Patient reports that she started having "bad pain in the  the stomach and the pubic area", she reports that it has gotten better.  She does report that on Monday she did a lot more than normal, stairs, lifting and carrying things, she c/o soreness.    Currently in Pain?  Yes    Pain Score  4     Pain Location  Abdomen    Pain Orientation  Lower    Aggravating Factors   lifting, stairs, pain on Tuesday was an 8/10, now a 4/10                       [facility] Adult PT Treatment/Exercise - 02/03/19 0001       Exercises   Exercises  Lumbar      Lumbar Exercises: Stretches   Passive Hamstring Stretch  Left;Right;3 reps;20 seconds    Lower Trunk Rotation  3 reps;10 seconds      Lumbar Exercises: Supine   Bridge with Ball Squeeze  20 reps    Other Supine Lumbar Exercises  feet on ball K2C, trunk rotation, small bridge activation, isometric abs      Modalities   Modalities  Electrical Stimulation;Moist Heat      Moist Heat Therapy   Number Minutes Moist Heat  15 Minutes    Moist Heat Location  Lumbar Spine      Electrical Stimulation   Electrical Stimulation Location  lower abdomen    Electrical Stimulation Action  IFC    Electrical Stimulation Parameters  supine    Electrical Stimulation Goals  Pain               PT Short Term Goals - 02/03/19 1651      PT SHORT TERM GOAL #1   Title  Patient independent with HEP    Status  Achieved  PT Long Term Goals - 01/26/19 1529      PT LONG TERM GOAL #1   Title  Patient able to walk to grocery shop without pain >4/10    Time  8    Period  Weeks    Status  New      PT LONG TERM GOAL #2   Title  stand to cook meals wihtout sitting to rest    Time  8    Period  Weeks    Status  New      PT LONG TERM GOAL #3   Title  Patient able to ascend/descend stairs at church with minimal pain.    Time  8    Period  Weeks    Status  New      PT LONG TERM GOAL #4   Title  Patient to demo 5/5 BLE strength to improve function.    Time  8    Period  Weeks    Status  New            Plan - 02/03/19 1647    Clinical Impression Statement  Patient comes in and reports that she was very active on Monday at her daughters, reports that she woke up in severe pain i nteh abdomen lower area and the pubic area.  I did some palpation and she is very tender, activation of the abs causes her to feel that painful sensation.    PT Next Visit Plan  hopefully jsut an abdominal strain    Consulted and Agree with Plan of Care  Patient        Patient will benefit from skilled therapeutic intervention in order to improve the following deficits and impairments:  Pain, Improper body mechanics, Postural dysfunction, Increased muscle spasms, Decreased activity tolerance, Decreased range of motion, Decreased strength, Difficulty walking, Impaired flexibility  Visit Diagnosis: Acute right-sided low back pain without sciatica  Muscle weakness (generalized)  Muscle spasm of back  Difficulty in walking, not elsewhere classified     Problem List Patient Active Problem List   Diagnosis Date Noted  . Febrile illness 01/06/2019  . On statin therapy due to risk of future cardiovascular event 12/01/2017  . Spondylolisthesis at L5-S1 level 08/13/2017  . Colon polyp 05/25/2017  . Ophthalmic migraine 05/25/2017  . Essential tremor 05/25/2017  . Allergic asthma, mild intermittent, uncomplicated 0000000  . Obesity, Class I, BMI 30-34.9 04/16/2015  . Conjunctivitis, allergic, chronic 10/01/2012  . Hypertriglyceridemia 09/22/2011  . Rosacea 06/25/2011  . Irritable bowel syndrome with diarrhea 07/08/2010  . Benign essential hypertension 05/23/2010  . CKD (chronic kidney disease) stage 3, GFR 30-59 ml/min (HCC) 05/23/2010  . Chronic allergic rhinitis 02/25/2010  . GERD (gastroesophageal reflux disease) 02/25/2010  . Psoriasis 02/25/2010  . Osteoarthritis, multiple sites 06/12/2009    Sumner Boast., PT 02/03/2019, 4:52 PM  [place] [address] [place], [name], [address] Phone: [phone]   Fax:  [phone]  Name: Sabrina Ramirez MRN: [id number] Date of Birth: [birth year]

## 2019-02-07 ENCOUNTER — Encounter: Payer: Self-pay | Admitting: Physical Therapy

## 2019-02-07 ENCOUNTER — Other Ambulatory Visit: Payer: Self-pay

## 2019-02-07 ENCOUNTER — Ambulatory Visit: Payer: Medicare PPO | Admitting: Physical Therapy

## 2019-02-07 DIAGNOSIS — R262 Difficulty in walking, not elsewhere classified: Secondary | ICD-10-CM | POA: Diagnosis not present

## 2019-02-07 DIAGNOSIS — M6281 Muscle weakness (generalized): Secondary | ICD-10-CM | POA: Diagnosis not present

## 2019-02-07 DIAGNOSIS — M62830 Muscle spasm of back: Secondary | ICD-10-CM

## 2019-02-07 DIAGNOSIS — M545 Low back pain, unspecified: Secondary | ICD-10-CM

## 2019-02-07 NOTE — Therapy (Signed)
[place] [address] [place], [name], [address] Phone: [phone]   Fax:  [phone]  Physical Therapy Treatment  Patient Details  Name: Sabrina Ramirez MRN: [id number] Date of Birth: September 05, 1940 Referring Provider (PT): Billey Chang   Encounter Date: 02/07/2019  PT End of Session - 02/07/19 1411    Visit Number  3    Date for PT Re-Evaluation  03/28/19    Authorization Type  [employer]    PT Start Time  1345    PT Stop Time  1419    PT Time Calculation (min)  34 min    Activity Tolerance  Patient limited by pain    Behavior During Therapy  [facility] for tasks assessed/performed       Past Medical History:  Diagnosis Date  . Allergic asthma, mild intermittent, uncomplicated 0000000   Dr. Donneta Romberg, allergist  . Arthritis   . Chronic kidney disease    Stage III kidney disease  . GERD (gastroesophageal reflux disease)   . Hemorrhoids   . History of kidney stones   . Hypertension   . IBS (irritable bowel syndrome)   . Psoriasis     Past Surgical History:  Procedure Laterality Date  . ABDOMINAL HYSTERECTOMY  1987  . CATARACT EXTRACTION W/ INTRAOCULAR LENS  IMPLANT, BILATERAL Bilateral 2015  . CHOLECYSTECTOMY    . HEMORRHOID SURGERY    . SPINE SURGERY  07/10/2017  . TONSILLECTOMY      There were no vitals filed for this visit.  Subjective Assessment - 02/07/19 1356    Subjective  "I am hurting today"    Pertinent History  had a fusion 3 level early 2019    Limitations  Lifting;Standing;Walking;House hold activities    How long can you stand comfortably?  10 minutes needs to rest    How long can you walk comfortably?  5-10 minutes    Diagnostic tests  none recently    Patient Stated Goals  have less pain, tolerate more activity    Currently in Pain?  Yes    Pain Score  9     Pain Location  Back    Pain Orientation  Right;Lower                       [facility] Adult PT Treatment/Exercise -  02/07/19 0001      Modalities   Modalities  Electrical Stimulation;Moist Heat      Moist Heat Therapy   Number Minutes Moist Heat  15 Minutes    Moist Heat Location  Lumbar Spine      Electrical Stimulation   Electrical Stimulation Location  lower abdomen    Electrical Stimulation Action  IFC    Electrical Stimulation Parameters  supine    Electrical Stimulation Goals  Pain      Manual Therapy   Manual Therapy  Soft tissue mobilization    Manual therapy comments  pain elicited when asked to lift RUE overhead.  Trigger pain noted in R lat    Soft tissue mobilization  Posterior para spinales               PT Short Term Goals - 02/03/19 1651      PT SHORT TERM GOAL #1   Title  Patient independent with HEP    Status  Achieved        PT Long Term Goals - 01/26/19 1529      PT LONG  TERM GOAL #1   Title  Patient able to walk to grocery shop without pain >4/10    Time  8    Period  Weeks    Status  New      PT LONG TERM GOAL #2   Title  stand to cook meals wihtout sitting to rest    Time  8    Period  Weeks    Status  New      PT LONG TERM GOAL #3   Title  Patient able to ascend/descend stairs at church with minimal pain.    Time  8    Period  Weeks    Status  New      PT LONG TERM GOAL #4   Title  Patient to demo 5/5 BLE strength to improve function.    Time  8    Period  Weeks    Status  New            Plan - 02/07/19 1412    Clinical Impression Statement  Pt enters clinic reporting increase pain in her low back. She reports a muscle spasm in her back. Some tenderness noted with STM. Trigger point noted in R lat, pain elicited when asked to abduct RUE over head, pain also elicited when asked to push her hands down into table, this reversal in muscle action in the lat could further proved of trigger point in the Lat and not para spinales.    Personal Factors and Comorbidities  Comorbidity 3+    Comorbidities  GERD, IBS, lumbar surgery     Stability/Clinical Decision Making  Stable/Uncomplicated    Rehab Potential  Good    PT Frequency  2x / week    PT Duration  8 weeks    PT Treatment/Interventions  ADLs/Self Care Home Management;Cryotherapy;Electrical Stimulation;Iontophoresis 4mg /ml Dexamethasone;Moist Heat;Ultrasound;Therapeutic activities;Therapeutic exercise;Neuromuscular re-education;Patient/family education;Dry needling    PT Next Visit Plan  assess Tx, STM tpo low back       Patient will benefit from skilled therapeutic intervention in order to improve the following deficits and impairments:  Pain, Improper body mechanics, Postural dysfunction, Increased muscle spasms, Decreased activity tolerance, Decreased range of motion, Decreased strength, Difficulty walking, Impaired flexibility  Visit Diagnosis: Muscle spasm of back  Muscle weakness (generalized)  Acute right-sided low back pain without sciatica  Difficulty in walking, not elsewhere classified     Problem List Patient Active Problem List   Diagnosis Date Noted  . Febrile illness 01/06/2019  . On statin therapy due to risk of future cardiovascular event 12/01/2017  . Spondylolisthesis at L5-S1 level 08/13/2017  . Colon polyp 05/25/2017  . Ophthalmic migraine 05/25/2017  . Essential tremor 05/25/2017  . Allergic asthma, mild intermittent, uncomplicated 0000000  . Obesity, Class I, BMI 30-34.9 04/16/2015  . Conjunctivitis, allergic, chronic 10/01/2012  . Hypertriglyceridemia 09/22/2011  . Rosacea 06/25/2011  . Irritable bowel syndrome with diarrhea 07/08/2010  . Benign essential hypertension 05/23/2010  . CKD (chronic kidney disease) stage 3, GFR 30-59 ml/min (HCC) 05/23/2010  . Chronic allergic rhinitis 02/25/2010  . GERD (gastroesophageal reflux disease) 02/25/2010  . Psoriasis 02/25/2010  . Osteoarthritis, multiple sites 06/12/2009    Scot Jun, PTA 02/07/2019, 2:18 PM  [place], [name], [address] Phone: [phone]   Fax:  [phone]  Name: Ronnette Askeland Luster MRN: [id number] Date of Birth: 08-24-1940

## 2019-02-10 ENCOUNTER — Encounter: Payer: Self-pay | Admitting: Physical Therapy

## 2019-02-10 ENCOUNTER — Other Ambulatory Visit: Payer: Self-pay

## 2019-02-10 ENCOUNTER — Ambulatory Visit: Payer: Medicare PPO | Attending: Family Medicine | Admitting: Physical Therapy

## 2019-02-10 DIAGNOSIS — R262 Difficulty in walking, not elsewhere classified: Secondary | ICD-10-CM | POA: Diagnosis not present

## 2019-02-10 DIAGNOSIS — M545 Low back pain, unspecified: Secondary | ICD-10-CM

## 2019-02-10 DIAGNOSIS — M62830 Muscle spasm of back: Secondary | ICD-10-CM

## 2019-02-10 DIAGNOSIS — M6281 Muscle weakness (generalized): Secondary | ICD-10-CM | POA: Diagnosis not present

## 2019-02-10 NOTE — Therapy (Signed)
[address] [place], [name], [address] Phone: [phone]   Fax:  [phone]  Physical Therapy Treatment  Patient Details  Name: Sabrina Ramirez MRN: [id number] Date of Birth: [birth year] Referring Provider (PT): Billey Chang   Encounter Date: 02/10/2019  PT End of Session - 02/10/19 1419    Visit Number  4    Date for PT Re-Evaluation  03/28/19    PT Start Time  [id number]    PT Stop Time  1432    PT Time Calculation (min)  47 min    Activity Tolerance  Patient tolerated treatment well    Behavior During Therapy  [facility] for tasks assessed/performed       Past Medical History:  Diagnosis Date  . Allergic asthma, mild intermittent, uncomplicated 0000000   Dr. Donneta Romberg, allergist  . Arthritis   . Chronic kidney disease    Stage III kidney disease  . GERD (gastroesophageal reflux disease)   . Hemorrhoids   . History of kidney stones   . Hypertension   . IBS (irritable bowel syndrome)   . Psoriasis     Past Surgical History:  Procedure Laterality Date  . ABDOMINAL HYSTERECTOMY  1987  . CATARACT EXTRACTION W/ INTRAOCULAR LENS  IMPLANT, BILATERAL Bilateral 2015  . CHOLECYSTECTOMY    . HEMORRHOID SURGERY    . SPINE SURGERY  07/10/2017  . TONSILLECTOMY      There were no vitals filed for this visit.  Subjective Assessment - 02/10/19 1346    Subjective  "Better than I was last time"    Pertinent History  had a fusion 3 level early 2019    Limitations  Lifting;Standing;Walking;House hold activities    Currently in Pain?  Yes    Pain Score  3     Pain Location  Back    Pain Orientation  Right;Mid                       [facility] Adult PT Treatment/Exercise - 02/10/19 0001      Lumbar Exercises: Machines for Strengthening   Leg Press  L3 x 5 min       Lumbar Exercises: Standing   Row  Theraband;20 reps;Both;Strengthening    Theraband Level (Row)  Level 2 (Red)    Shoulder Extension   Theraband;20 reps;Both;Strengthening    Theraband Level (Shoulder Extension)  Level 2 (Red)      Lumbar Exercises: Seated   Long Arc Quad on Chair  Strengthening;Both;2 sets;10 reps    LAQ on Chair Weights (lbs)  2    Other Seated Lumbar Exercises  HS curls red 2x10     Other Seated Lumbar Exercises  Seated maerch 2lb x10 x5 each      Modalities   Modalities  Electrical Stimulation;Moist Heat      Moist Heat Therapy   Number Minutes Moist Heat  15 Minutes    Moist Heat Location  Lumbar Spine      Electrical Stimulation   Electrical Stimulation Location  lower abdomen    Electrical Stimulation Action  IFC    Electrical Stimulation Parameters  supine    Electrical Stimulation Goals  Pain               PT Short Term Goals - 02/03/19 1651      PT SHORT TERM GOAL #1   Title  Patient independent with HEP    Status  Achieved  PT Long Term Goals - 01/26/19 1529      PT LONG TERM GOAL #1   Title  Patient able to walk to grocery shop without pain >4/10    Time  8    Period  Weeks    Status  New      PT LONG TERM GOAL #2   Title  stand to cook meals wihtout sitting to rest    Time  8    Period  Weeks    Status  New      PT LONG TERM GOAL #3   Title  Patient able to ascend/descend stairs at church with minimal pain.    Time  8    Period  Weeks    Status  New      PT LONG TERM GOAL #4   Title  Patient to demo 5/5 BLE strength to improve function.    Time  8    Period  Weeks    Status  New            Plan - 02/10/19 1419    Clinical Impression Statement  Pt reports som improvement today, was able to introduce her to some gentle LE and core exercises. Seated march cause the muscles in her back to tighten up. Good posture with Tband rows, but required cues to stay erect with shoulder extensions.    Comorbidities  GERD, IBS, lumbar surgery    Stability/Clinical Decision Making  Stable/Uncomplicated    Rehab Potential  Good    PT Frequency  2x / week     PT Duration  8 weeks    PT Treatment/Interventions  ADLs/Self Care Home Management;Cryotherapy;Electrical Stimulation;Iontophoresis 4mg /ml Dexamethasone;Moist Heat;Ultrasound;Therapeutic activities;Therapeutic exercise;Neuromuscular re-education;Patient/family education;Dry needling    PT Next Visit Plan  assess Tx, Core strength and stability,  STM to low back       Patient will benefit from skilled therapeutic intervention in order to improve the following deficits and impairments:  Pain, Improper body mechanics, Postural dysfunction, Increased muscle spasms, Decreased activity tolerance, Decreased range of motion, Decreased strength, Difficulty walking, Impaired flexibility  Visit Diagnosis: Muscle weakness (generalized)  Acute right-sided low back pain without sciatica  Difficulty in walking, not elsewhere classified  Muscle spasm of back     Problem List Patient Active Problem List   Diagnosis Date Noted  . Febrile illness 01/06/2019  . On statin therapy due to risk of future cardiovascular event 12/01/2017  . Spondylolisthesis at L5-S1 level 08/13/2017  . Colon polyp 05/25/2017  . Ophthalmic migraine 05/25/2017  . Essential tremor 05/25/2017  . Allergic asthma, mild intermittent, uncomplicated 0000000  . Obesity, Class I, BMI 30-34.9 04/16/2015  . Conjunctivitis, allergic, chronic 10/01/2012  . Hypertriglyceridemia 09/22/2011  . Rosacea 06/25/2011  . Irritable bowel syndrome with diarrhea 07/08/2010  . Benign essential hypertension 05/23/2010  . CKD (chronic kidney disease) stage 3, GFR 30-59 ml/min (HCC) 05/23/2010  . Chronic allergic rhinitis 02/25/2010  . GERD (gastroesophageal reflux disease) 02/25/2010  . Psoriasis 02/25/2010  . Osteoarthritis, multiple sites 06/12/2009    Scot Jun, PTA 02/10/2019, 2:22 PM  [place], [name], [address] Phone: [phone]    Fax:  [phone]  Name: Sabrina Ramirez MRN: [id number] Date of Birth: 09-29-1940

## 2019-02-15 ENCOUNTER — Ambulatory Visit: Payer: Medicare PPO | Admitting: Physical Therapy

## 2019-02-17 ENCOUNTER — Other Ambulatory Visit: Payer: Self-pay | Admitting: Family Medicine

## 2019-02-21 ENCOUNTER — Encounter: Payer: Self-pay | Admitting: Physical Therapy

## 2019-02-21 ENCOUNTER — Other Ambulatory Visit: Payer: Self-pay

## 2019-02-21 ENCOUNTER — Ambulatory Visit: Payer: Medicare PPO | Admitting: Physical Therapy

## 2019-02-21 DIAGNOSIS — M62830 Muscle spasm of back: Secondary | ICD-10-CM | POA: Diagnosis not present

## 2019-02-21 DIAGNOSIS — M545 Low back pain, unspecified: Secondary | ICD-10-CM

## 2019-02-21 DIAGNOSIS — R262 Difficulty in walking, not elsewhere classified: Secondary | ICD-10-CM

## 2019-02-21 DIAGNOSIS — M6281 Muscle weakness (generalized): Secondary | ICD-10-CM

## 2019-02-21 NOTE — Therapy (Signed)
[place], [name], [address] Phone: [phone]   Fax:  [phone]  Physical Therapy Treatment  Patient Details  Name: Sabrina Ramirez MRN: [id number] Date of Birth: Aug 19, 1940 Referring Provider (PT): Billey Chang   Encounter Date: 02/21/2019  PT End of Session - 02/21/19 1432    Visit Number  5    Date for PT Re-Evaluation  03/28/19    PT Start Time  [id number]    PT Stop Time  1447    PT Time Calculation (min)  62 min    Activity Tolerance  Patient tolerated treatment well    Behavior During Therapy  [facility] for tasks assessed/performed       Past Medical History:  Diagnosis Date  . Allergic asthma, mild intermittent, uncomplicated 0000000   Dr. Donneta Romberg, allergist  . Arthritis   . Chronic kidney disease    Stage III kidney disease  . GERD (gastroesophageal reflux disease)   . Hemorrhoids   . History of kidney stones   . Hypertension   . IBS (irritable bowel syndrome)   . Psoriasis     Past Surgical History:  Procedure Laterality Date  . ABDOMINAL HYSTERECTOMY  1987  . CATARACT EXTRACTION W/ INTRAOCULAR LENS  IMPLANT, BILATERAL Bilateral 2015  . CHOLECYSTECTOMY    . HEMORRHOID SURGERY    . SPINE SURGERY  07/10/2017  . TONSILLECTOMY      There were no vitals filed for this visit.  Subjective Assessment - 02/21/19 1349    Subjective  feeling good, limited by pain in LB.  L shoulder very tender 8/10 hurts enough to wake you up.    Pain Score  4     Pain Location  Back                       [facility] Adult PT Treatment/Exercise - 02/21/19 0001      Lumbar Exercises: Machines for Strengthening   Cybex Lumbar Extension  B Tband Ext 2x10    Leg Press  L3 x 5 min  W/o UE use     Lumbar Exercises: Standing   Row  Theraband;Both;Strengthening   2x15   Theraband Level (Row)  Level 2 (Red)    Shoulder Extension  Strengthening;Both;Theraband   2x15   Theraband Level  (Shoulder Extension)  Level 2 (Red)    Other Standing Lumbar Exercises  R Tband ER/IR 1x10, cues for proper form for ER     Lumbar Exercises: Seated   Long Arc Quad on Chair  Strengthening;Both;2 sets;10 reps    LAQ on Chair Weights (lbs)  3    Sit to Stand  10 reps    Other Seated Lumbar Exercises  HS curls red 2x15     Other Seated Lumbar Exercises  Seated march 2lb x10 x5 each       Lumbar Exercises: Supine   Bridge  Compliant;10 reps;3 seconds   2 sets     Lumbar Exercises: Quadruped   Opposite Arm/Leg Raise Limitations  15      Modalities   Modalities  Electrical Stimulation;Moist Heat      Moist Heat Therapy   Number Minutes Moist Heat  15 Minutes    Moist Heat Location  Lumbar Spine;Shoulder      Electrical Stimulation   Electrical Stimulation Location  lower abdomen    Electrical Stimulation Action  IFC    Electrical Stimulation Parameters  supine  Electrical Stimulation Goals  Pain               PT Short Term Goals - 02/03/19 1651      PT SHORT TERM GOAL #1   Title  Patient independent with HEP    Status  Achieved        PT Long Term Goals - 02/21/19 1437      PT LONG TERM GOAL #1   Title  Patient able to walk to grocery shop without pain >4/10    Status  On-going      PT LONG TERM GOAL #2   Title  stand to cook meals wihtout sitting to rest    Status  On-going      PT LONG TERM GOAL #3   Title  Patient able to ascend/descend stairs at church with minimal pain.      PT LONG TERM GOAL #4   Title  Patient to demo 5/5 BLE strength to improve function.    Status  On-going      PT LONG TERM GOAL #5   Title  New goal added 03/25/18: Pt will be able to ambulate one lap around PT building and stairs without needing rest break, to show improved endurance for community ambulation.     Status  On-going            Plan - 02/21/19 1433    Clinical Impression Statement  pt tolerated treatment well as far as LB but LUE was tender and in pain.  Pt able to performall exrercises with no increase in pain for LB, LUE limited by pain, specifically during abduction. fatigue present after seated leg curls, LAQ, and marching while seated. Pt will continue to benefit from PT to further address LB pain, functional endurance, and strength of postural muscles.    PT Treatment/Interventions  ADLs/Self Care Home Management;Cryotherapy;Electrical Stimulation;Iontophoresis 4mg /ml Dexamethasone;Moist Heat;Ultrasound;Therapeutic activities;Therapeutic exercise;Neuromuscular re-education;Patient/family education;Dry needling    PT Next Visit Plan  assess Tx, Core strenght and stability,  STM to low back       Patient will benefit from skilled therapeutic intervention in order to improve the following deficits and impairments:  Pain, Improper body mechanics, Postural dysfunction, Increased muscle spasms, Decreased activity tolerance, Decreased range of motion, Decreased strength, Difficulty walking, Impaired flexibility  Visit Diagnosis: Muscle weakness (generalized)  Acute right-sided low back pain without sciatica  Difficulty in walking, not elsewhere classified     Problem List Patient Active Problem List   Diagnosis Date Noted  . Febrile illness 01/06/2019  . On statin therapy due to risk of future cardiovascular event 12/01/2017  . Spondylolisthesis at L5-S1 level 08/13/2017  . Colon polyp 05/25/2017  . Ophthalmic migraine 05/25/2017  . Essential tremor 05/25/2017  . Allergic asthma, mild intermittent, uncomplicated 0000000  . Obesity, Class I, BMI 30-34.9 04/16/2015  . Conjunctivitis, allergic, chronic 10/01/2012  . Hypertriglyceridemia 09/22/2011  . Rosacea 06/25/2011  . Irritable bowel syndrome with diarrhea 07/08/2010  . Benign essential hypertension 05/23/2010  . CKD (chronic kidney disease) stage 3, GFR 30-59 ml/min (HCC) 05/23/2010  . Chronic allergic rhinitis 02/25/2010  . GERD (gastroesophageal reflux disease) 02/25/2010   . Psoriasis 02/25/2010  . Osteoarthritis, multiple sites 06/12/2009    Darcella Cheshire, SPTA 02/21/2019, 2:40 PM  [place], [name], [address] Phone: [phone]   Fax:  [phone]  Name: Stephanine Bek Schweers MRN: [id number] Date of Birth: 1940-12-28

## 2019-02-24 ENCOUNTER — Encounter: Payer: Self-pay | Admitting: Physical Therapy

## 2019-02-24 ENCOUNTER — Ambulatory Visit: Payer: Medicare PPO | Admitting: Physical Therapy

## 2019-02-24 ENCOUNTER — Other Ambulatory Visit: Payer: Self-pay

## 2019-02-24 DIAGNOSIS — M6281 Muscle weakness (generalized): Secondary | ICD-10-CM | POA: Diagnosis not present

## 2019-02-24 DIAGNOSIS — R262 Difficulty in walking, not elsewhere classified: Secondary | ICD-10-CM | POA: Diagnosis not present

## 2019-02-24 DIAGNOSIS — M545 Low back pain, unspecified: Secondary | ICD-10-CM

## 2019-02-24 DIAGNOSIS — M62830 Muscle spasm of back: Secondary | ICD-10-CM

## 2019-02-24 NOTE — Therapy (Signed)
[place], [name], [address] Phone: [phone]   Fax:  [phone]  Physical Therapy Treatment  Patient Details  Name: Sabrina Ramirez MRN: [id number] Date of Birth: 1940-12-25 Referring Provider (PT): Billey Chang   Encounter Date: 02/24/2019  PT End of Session - 02/24/19 1426    Visit Number  6    Date for PT Re-Evaluation  03/28/19    Authorization Type  [employer]    PT Start Time  [id number]    PT Stop Time  1433    PT Time Calculation (min)  48 min    Activity Tolerance  Patient tolerated treatment well    Behavior During Therapy  [facility] for tasks assessed/performed       Past Medical History:  Diagnosis Date  . Allergic asthma, mild intermittent, uncomplicated 0000000   Dr. Donneta Romberg, allergist  . Arthritis   . Chronic kidney disease    Stage III kidney disease  . GERD (gastroesophageal reflux disease)   . Hemorrhoids   . History of kidney stones   . Hypertension   . IBS (irritable bowel syndrome)   . Psoriasis     Past Surgical History:  Procedure Laterality Date  . ABDOMINAL HYSTERECTOMY  1987  . CATARACT EXTRACTION W/ INTRAOCULAR LENS  IMPLANT, BILATERAL Bilateral 2015  . CHOLECYSTECTOMY    . HEMORRHOID SURGERY    . SPINE SURGERY  07/10/2017  . TONSILLECTOMY      There were no vitals filed for this visit.  Subjective Assessment - 02/24/19 1350    Subjective  "My back is better today" "No pain because I took something" "My shoulder woke me up last night"    Currently in Pain?  No/denies    Pain Score  3     Pain Location  Shoulder    Pain Orientation  Left                       [facility] Adult PT Treatment/Exercise - 02/24/19 0001      Lumbar Exercises: Aerobic   Nustep  L3 x 5 min      Lumbar Exercises: Machines for Strengthening   Cybex Knee Extension  5lb 2x10     Cybex Knee Flexion  20lb 2x10       Lumbar Exercises: Standing   Shoulder Extension   Strengthening;Both;20 reps    Shoulder Extension Limitations  5lb      Modalities   Modalities  Electrical Stimulation;Moist Heat      Moist Heat Therapy   Number Minutes Moist Heat  10 Minutes    Moist Heat Location  Lumbar Spine;Shoulder      Electrical Stimulation   Electrical Stimulation Location  lower abdomen    Electrical Stimulation Action  IFC    Electrical Stimulation Parameters  supine    Electrical Stimulation Goals  Pain      Manual Therapy   Manual Therapy  Soft tissue mobilization;Passive ROM;Joint mobilization    Joint Mobilization  Gentel     Passive ROM  L shoulder all directions               PT Short Term Goals - 02/03/19 1651      PT SHORT TERM GOAL #1   Title  Patient independent with HEP    Status  Achieved        PT Long Term Goals - 02/21/19 1437      PT  LONG TERM GOAL #1   Title  Patient able to walk to grocery shop without pain >4/10    Status  On-going      PT LONG TERM GOAL #2   Title  stand to cook meals wihtout sitting to rest    Status  On-going      PT LONG TERM GOAL #3   Title  Patient able to ascend/descend stairs at church with minimal pain.      PT LONG TERM GOAL #4   Title  Patient to demo 5/5 BLE strength to improve function.    Status  On-going      PT LONG TERM GOAL #5   Title  New goal added 03/25/18: Pt will be able to ambulate one lap around PT building and stairs without needing rest break, to show improved endurance for community ambulation.     Status  On-going            Plan - 02/24/19 1428    Clinical Impression Statement  Pt reports that her back is doing really good, however she reports some L shoulder pain. She stated that main has been going on for a long time. She reports that hr shoulder is worst at night if she sleeps on it. No issues with the LE exercises other than muscle burning and fatigue. L shoulder pain reported with seated rows and lats. Positive response to MT evident by increase  passive motion and decrease pain. She is very guarded with MT with pain at tightness at end range.    Comorbidities  GERD, IBS, lumbar surgery    Stability/Clinical Decision Making  Stable/Uncomplicated    Rehab Potential  Good    PT Frequency  2x / week    PT Duration  8 weeks    PT Treatment/Interventions  ADLs/Self Care Home Management;Cryotherapy;Electrical Stimulation;Iontophoresis 4mg /ml Dexamethasone;Moist Heat;Ultrasound;Therapeutic activities;Therapeutic exercise;Neuromuscular re-education;Patient/family education;Dry needling    PT Next Visit Plan  Core strenght and stability,  STM to low back. Some MT to LUE       Patient will benefit from skilled therapeutic intervention in order to improve the following deficits and impairments:  Pain, Improper body mechanics, Postural dysfunction, Increased muscle spasms, Decreased activity tolerance, Decreased range of motion, Decreased strength, Difficulty walking, Impaired flexibility  Visit Diagnosis: Acute right-sided low back pain without sciatica  Muscle weakness (generalized)  Difficulty in walking, not elsewhere classified  Muscle spasm of back     Problem List Patient Active Problem List   Diagnosis Date Noted  . Febrile illness 01/06/2019  . On statin therapy due to risk of future cardiovascular event 12/01/2017  . Spondylolisthesis at L5-S1 level 08/13/2017  . Colon polyp 05/25/2017  . Ophthalmic migraine 05/25/2017  . Essential tremor 05/25/2017  . Allergic asthma, mild intermittent, uncomplicated 0000000  . Obesity, Class I, BMI 30-34.9 04/16/2015  . Conjunctivitis, allergic, chronic 10/01/2012  . Hypertriglyceridemia 09/22/2011  . Rosacea 06/25/2011  . Irritable bowel syndrome with diarrhea 07/08/2010  . Benign essential hypertension 05/23/2010  . CKD (chronic kidney disease) stage 3, GFR 30-59 ml/min (HCC) 05/23/2010  . Chronic allergic rhinitis 02/25/2010  . GERD (gastroesophageal reflux disease) 02/25/2010   . Psoriasis 02/25/2010  . Osteoarthritis, multiple sites 06/12/2009    Scot Jun 02/24/2019, 2:33 PM  [place], [name], [address] Phone: [phone]   Fax:  [phone]  Name: Sabrina Ramirez MRN: [id number] Date of Birth: 13-Feb-1941

## 2019-02-28 ENCOUNTER — Ambulatory Visit: Payer: Medicare PPO | Admitting: Physical Therapy

## 2019-02-28 ENCOUNTER — Other Ambulatory Visit: Payer: Self-pay

## 2019-02-28 ENCOUNTER — Encounter: Payer: Self-pay | Admitting: Physical Therapy

## 2019-02-28 DIAGNOSIS — M62830 Muscle spasm of back: Secondary | ICD-10-CM

## 2019-02-28 DIAGNOSIS — M545 Low back pain, unspecified: Secondary | ICD-10-CM

## 2019-02-28 DIAGNOSIS — R262 Difficulty in walking, not elsewhere classified: Secondary | ICD-10-CM

## 2019-02-28 DIAGNOSIS — M6281 Muscle weakness (generalized): Secondary | ICD-10-CM

## 2019-02-28 NOTE — Therapy (Signed)
[place], [name], [address] Phone: [phone]   Fax:  [phone]  Physical Therapy Treatment  Patient Details  Name: Sabrina Ramirez MRN: [id number] Date of Birth: 1940-09-11 Referring Provider (PT): Billey Chang   Encounter Date: 02/28/2019  PT End of Session - 02/28/19 1434    PT Start Time  [id number]    PT Stop Time  1445    PT Time Calculation (min)  56 min    Activity Tolerance  Patient tolerated treatment well    Behavior During Therapy  [facility] for tasks assessed/performed       Past Medical History:  Diagnosis Date  . Allergic asthma, mild intermittent, uncomplicated 0000000   Dr. Donneta Romberg, allergist  . Arthritis   . Chronic kidney disease    Stage III kidney disease  . GERD (gastroesophageal reflux disease)   . Hemorrhoids   . History of kidney stones   . Hypertension   . IBS (irritable bowel syndrome)   . Psoriasis     Past Surgical History:  Procedure Laterality Date  . ABDOMINAL HYSTERECTOMY  1987  . CATARACT EXTRACTION W/ INTRAOCULAR LENS  IMPLANT, BILATERAL Bilateral 2015  . CHOLECYSTECTOMY    . HEMORRHOID SURGERY    . SPINE SURGERY  07/10/2017  . TONSILLECTOMY      There were no vitals filed for this visit.  Subjective Assessment - 02/28/19 1352    Subjective  Pt reports that her back is doing better but her L shoulder is giving her problems.    Pertinent History  had a fusion 3 level early 2019    Currently in Pain?  Yes    Pain Score  2     Pain Location  Shoulder    Pain Orientation  Left                       [facility] Adult PT Treatment/Exercise - 02/28/19 0001      Lumbar Exercises: Aerobic   UBE (Upper Arm Bike)  L2 x 3 min     Nustep  L3 x 5 min      Lumbar Exercises: Machines for Strengthening   Cybex Knee Extension  5lb 2x10     Cybex Knee Flexion  20lb 2x10       Lumbar Exercises: Standing   Row  Theraband;Both;Strengthening;20  reps    Theraband Level (Row)  Level 3 (Green)    Shoulder Extension  Strengthening;Both;20 reps    Shoulder Extension Limitations  5    Other Standing Lumbar Exercises  Hip ext & abd red band x10      Lumbar Exercises: Seated   Sit to Stand  10 reps   x2 from blue chair holding red ball      Modalities   Modalities  Electrical Stimulation;Moist Heat      Moist Heat Therapy   Number Minutes Moist Heat  15 Minutes    Moist Heat Location  Shoulder      Electrical Stimulation   Electrical Stimulation Location  L shoulder    Electrical Stimulation Action  IFC    Electrical Stimulation Parameters  supine    Electrical Stimulation Goals  Pain      Manual Therapy   Manual Therapy  Soft tissue mobilization;Passive ROM;Joint mobilization    Joint Mobilization  Grades 1&2    Passive ROM  L shoulder all directions  PT Short Term Goals - 02/03/19 1651      PT SHORT TERM GOAL #1   Title  Patient independent with HEP    Status  Achieved        PT Long Term Goals - 02/21/19 1437      PT LONG TERM GOAL #1   Title  Patient able to walk to grocery shop without pain >4/10    Status  On-going      PT LONG TERM GOAL #2   Title  stand to cook meals wihtout sitting to rest    Status  On-going      PT LONG TERM GOAL #3   Title  Patient able to ascend/descend stairs at church with minimal pain.      PT LONG TERM GOAL #4   Title  Patient to demo 5/5 BLE strength to improve function.    Status  On-going      PT LONG TERM GOAL #5   Title  New goal added 03/25/18: Pt will be able to ambulate one lap around PT building and stairs without needing rest break, to show improved endurance for community ambulation.     Status  On-going            Plan - 02/28/19 1436    Clinical Impression Statement  Again pt reports that's her back continues to do well, but her shoulder is still an issue. Some tactile cues for posture needed with standing hip exercises. Shoulder  ER with yellow band did cause some shoulder pain. Tband rows were performed without issue. Some pain with MT consistent with frozen shoulder.    Personal Factors and Comorbidities  Comorbidity 3+    Comorbidities  GERD, IBS, lumbar surgery    Stability/Clinical Decision Making  Stable/Uncomplicated    Rehab Potential  Good    PT Frequency  2x / week    PT Duration  8 weeks    PT Treatment/Interventions  ADLs/Self Care Home Management;Cryotherapy;Electrical Stimulation;Iontophoresis 4mg /ml Dexamethasone;Moist Heat;Ultrasound;Therapeutic activities;Therapeutic exercise;Neuromuscular re-education;Patient/family education;Dry needling    PT Next Visit Plan  Core strength and stability,  STM to low back. Some MT to LUE       Patient will benefit from skilled therapeutic intervention in order to improve the following deficits and impairments:  Pain, Improper body mechanics, Postural dysfunction, Increased muscle spasms, Decreased activity tolerance, Decreased range of motion, Decreased strength, Difficulty walking, Impaired flexibility  Visit Diagnosis: Muscle weakness (generalized)  Muscle spasm of back  Difficulty in walking, not elsewhere classified  Acute right-sided low back pain without sciatica     Problem List Patient Active Problem List   Diagnosis Date Noted  . Febrile illness 01/06/2019  . On statin therapy due to risk of future cardiovascular event 12/01/2017  . Spondylolisthesis at L5-S1 level 08/13/2017  . Colon polyp 05/25/2017  . Ophthalmic migraine 05/25/2017  . Essential tremor 05/25/2017  . Allergic asthma, mild intermittent, uncomplicated 0000000  . Obesity, Class I, BMI 30-34.9 04/16/2015  . Conjunctivitis, allergic, chronic 10/01/2012  . Hypertriglyceridemia 09/22/2011  . Rosacea 06/25/2011  . Irritable bowel syndrome with diarrhea 07/08/2010  . Benign essential hypertension 05/23/2010  . CKD (chronic kidney disease) stage 3, GFR 30-59 ml/min (HCC)  05/23/2010  . Chronic allergic rhinitis 02/25/2010  . GERD (gastroesophageal reflux disease) 02/25/2010  . Psoriasis 02/25/2010  . Osteoarthritis, multiple sites 06/12/2009    Scot Jun 02/28/2019, 2:41 PM  [place]  [place], [name], [address] Phone: [phone]   Fax:  [phone]  Name: Sabrina Ramirez MRN: [id number] Date of Birth: 1941/06/07

## 2019-03-03 ENCOUNTER — Encounter: Payer: Self-pay | Admitting: Physical Therapy

## 2019-03-03 ENCOUNTER — Other Ambulatory Visit: Payer: Self-pay

## 2019-03-03 ENCOUNTER — Ambulatory Visit: Payer: Medicare PPO | Admitting: Physical Therapy

## 2019-03-03 DIAGNOSIS — R262 Difficulty in walking, not elsewhere classified: Secondary | ICD-10-CM

## 2019-03-03 DIAGNOSIS — M62830 Muscle spasm of back: Secondary | ICD-10-CM | POA: Diagnosis not present

## 2019-03-03 DIAGNOSIS — M545 Low back pain, unspecified: Secondary | ICD-10-CM

## 2019-03-03 DIAGNOSIS — M6281 Muscle weakness (generalized): Secondary | ICD-10-CM | POA: Diagnosis not present

## 2019-03-03 NOTE — Therapy (Signed)
[place], [name], [address] Phone: [phone]   Fax:  [phone]  Physical Therapy Treatment  Patient Details  Name: Sabrina Ramirez MRN: [id number] Date of Birth: 09-03-1940 Referring Provider (PT): Billey Chang   Encounter Date: 03/03/2019  PT End of Session - 03/03/19 1415    Visit Number  7    Date for PT Re-Evaluation  03/28/19    Authorization Type  [employer]    PT Start Time  [id number]    PT Stop Time  1430    PT Time Calculation (min)  45 min    Behavior During Therapy  [facility] for tasks assessed/performed       Past Medical History:  Diagnosis Date  . Allergic asthma, mild intermittent, uncomplicated 0000000   Dr. Donneta Romberg, allergist  . Arthritis   . Chronic kidney disease    Stage III kidney disease  . GERD (gastroesophageal reflux disease)   . Hemorrhoids   . History of kidney stones   . Hypertension   . IBS (irritable bowel syndrome)   . Psoriasis     Past Surgical History:  Procedure Laterality Date  . ABDOMINAL HYSTERECTOMY  1987  . CATARACT EXTRACTION W/ INTRAOCULAR LENS  IMPLANT, BILATERAL Bilateral 2015  . CHOLECYSTECTOMY    . HEMORRHOID SURGERY    . SPINE SURGERY  07/10/2017  . TONSILLECTOMY      There were no vitals filed for this visit.  Subjective Assessment - 03/03/19 1345    Subjective  Pr reports that last night was nit a good night for the back or the shoulder "I think it was the weather"    Pertinent History  had a fusion 3 level early 2019    Currently in Pain?  Yes    Pain Score  1     Pain Location  Back   L shoulder 4/10                      [facility] Adult PT Treatment/Exercise - 03/03/19 0001      Lumbar Exercises: Aerobic   UBE (Upper Arm Bike)  L2 x 3 min     Nustep  L4 x 5 min      Lumbar Exercises: Machines for Strengthening   Cybex Knee Extension  5lb 2x10     Cybex Knee Flexion  20lb 2x10       Lumbar Exercises: Standing    Row  Theraband;Both;Strengthening;15 reps   x2   Theraband Level (Row)  Level 3 (Green)    Shoulder Extension  Strengthening;Both;15 reps;Theraband   x2   Theraband Level (Shoulder Extension)  Level 3 (Green)    Other Standing Lumbar Exercises  back ext x10 holding red ball    Other Standing Lumbar Exercises  Hip ext & abd 2lc x10      Modalities   Modalities  Electrical Stimulation;Moist Heat      Moist Heat Therapy   Number Minutes Moist Heat  15 Minutes    Moist Heat Location  Shoulder      Electrical Stimulation   Electrical Stimulation Location  L shoulder    Electrical Stimulation Action  IFC    Electrical Stimulation Parameters  supine    Electrical Stimulation Goals  Pain               PT Short Term Goals - 02/03/19 1651      PT SHORT TERM GOAL #1  Title  Patient independent with HEP    Status  Achieved        PT Long Term Goals - 02/21/19 1437      PT LONG TERM GOAL #1   Title  Patient able to walk to grocery shop without pain >4/10    Status  On-going      PT LONG TERM GOAL #2   Title  stand to cook meals wihtout sitting to rest    Status  On-going      PT LONG TERM GOAL #3   Title  Patient able to ascend/descend stairs at church with minimal pain.      PT LONG TERM GOAL #4   Title  Patient to demo 5/5 BLE strength to improve function.    Status  On-going      PT LONG TERM GOAL #5   Title  New goal added 03/25/18: Pt will be able to ambulate one lap around PT building and stairs without needing rest break, to show improved endurance for community ambulation.     Status  On-going            Plan - 03/03/19 1416    Clinical Impression Statement  Back pain reported last night, but think it was due to the weather. Tactile cues needed with standing hip exercises. She did report some low back tightness with lumbar and hip extensions. Pt tolerated an increase of repetition with standing tband rows and extensions. Modality to help with low  back pain.    Personal Factors and Comorbidities  Comorbidity 3+    Comorbidities  GERD, IBS, lumbar surgery    Stability/Clinical Decision Making  Stable/Uncomplicated    Rehab Potential  Good    PT Frequency  2x / week    PT Duration  8 weeks    PT Next Visit Plan  Core strenght and stability,  STM to low back. Some MT to LUE       Patient will benefit from skilled therapeutic intervention in order to improve the following deficits and impairments:  Pain, Improper body mechanics, Postural dysfunction, Increased muscle spasms, Decreased activity tolerance, Decreased range of motion, Decreased strength, Difficulty walking, Impaired flexibility  Visit Diagnosis: Muscle weakness (generalized)  Muscle spasm of back  Difficulty in walking, not elsewhere classified  Acute right-sided low back pain without sciatica     Problem List Patient Active Problem List   Diagnosis Date Noted  . Febrile illness 01/06/2019  . On statin therapy due to risk of future cardiovascular event 12/01/2017  . Spondylolisthesis at L5-S1 level 08/13/2017  . Colon polyp 05/25/2017  . Ophthalmic migraine 05/25/2017  . Essential tremor 05/25/2017  . Allergic asthma, mild intermittent, uncomplicated 0000000  . Obesity, Class I, BMI 30-34.9 04/16/2015  . Conjunctivitis, allergic, chronic 10/01/2012  . Hypertriglyceridemia 09/22/2011  . Rosacea 06/25/2011  . Irritable bowel syndrome with diarrhea 07/08/2010  . Benign essential hypertension 05/23/2010  . CKD (chronic kidney disease) stage 3, GFR 30-59 ml/min (HCC) 05/23/2010  . Chronic allergic rhinitis 02/25/2010  . GERD (gastroesophageal reflux disease) 02/25/2010  . Psoriasis 02/25/2010  . Osteoarthritis, multiple sites 06/12/2009    Scot Jun, PTA 03/03/2019, 2:20 PM  [place], [name], [address] Phone: [phone]   Fax:  [phone]  Name:  Sabrina Ramirez MRN: [id number] Date of Birth: 1940-12-11

## 2019-03-07 ENCOUNTER — Ambulatory Visit: Payer: Medicare PPO | Admitting: Physical Therapy

## 2019-03-07 ENCOUNTER — Other Ambulatory Visit: Payer: Self-pay

## 2019-03-07 ENCOUNTER — Encounter: Payer: Self-pay | Admitting: Physical Therapy

## 2019-03-07 DIAGNOSIS — M62830 Muscle spasm of back: Secondary | ICD-10-CM | POA: Diagnosis not present

## 2019-03-07 DIAGNOSIS — R262 Difficulty in walking, not elsewhere classified: Secondary | ICD-10-CM | POA: Diagnosis not present

## 2019-03-07 DIAGNOSIS — M545 Low back pain, unspecified: Secondary | ICD-10-CM

## 2019-03-07 DIAGNOSIS — M6281 Muscle weakness (generalized): Secondary | ICD-10-CM | POA: Diagnosis not present

## 2019-03-07 NOTE — Therapy (Signed)
[place] [address] [place], [name], [address] Phone: [phone]   Fax:  [phone]  Physical Therapy Treatment  Patient Details  Name: Sabrina Ramirez MRN: [id number] Date of Birth: 1940-07-15 Referring Provider (PT): Billey Chang   Encounter Date: 03/07/2019  PT End of Session - 03/07/19 1423    Visit Number  8    Date for PT Re-Evaluation  03/28/19    Authorization Type  [employer]    PT Start Time  [id number]    PT Stop Time  1420    PT Time Calculation (min)  35 min    Activity Tolerance  Patient tolerated treatment well    Behavior During Therapy  [facility] for tasks assessed/performed       Past Medical History:  Diagnosis Date  . Allergic asthma, mild intermittent, uncomplicated 0000000   Dr. Donneta Romberg, allergist  . Arthritis   . Chronic kidney disease    Stage III kidney disease  . GERD (gastroesophageal reflux disease)   . Hemorrhoids   . History of kidney stones   . Hypertension   . IBS (irritable bowel syndrome)   . Psoriasis     Past Surgical History:  Procedure Laterality Date  . ABDOMINAL HYSTERECTOMY  1987  . CATARACT EXTRACTION W/ INTRAOCULAR LENS  IMPLANT, BILATERAL Bilateral 2015  . CHOLECYSTECTOMY    . HEMORRHOID SURGERY    . SPINE SURGERY  07/10/2017  . TONSILLECTOMY      There were no vitals filed for this visit.  Subjective Assessment - 03/07/19 1349    Subjective  "Not bad, my shoulder did not wake me up last night"    Currently in Pain?  Yes    Pain Score  3    back and L shoulder                      [facility] Adult PT Treatment/Exercise - 03/07/19 0001      Lumbar Exercises: Aerobic   UBE (Upper Arm Bike)  L2 x 3 min     Nustep  L3 x 5 min      Lumbar Exercises: Machines for Strengthening   Cybex Knee Extension  5lb 2x10     Cybex Knee Flexion  25lb 2x10     Other Lumbar Machine Exercise  Rows & Lats 15lb 2x10       Lumbar Exercises: Standing   Shoulder  Extension  20 reps;Both;Strengthening    Shoulder Extension Limitations  5    Other Standing Lumbar Exercises  Hip flex, ext & abd 2lb x10               PT Short Term Goals - 02/03/19 1651      PT SHORT TERM GOAL #1   Title  Patient independent with HEP    Status  Achieved        PT Long Term Goals - 02/21/19 1437      PT LONG TERM GOAL #1   Title  Patient able to walk to grocery shop without pain >4/10    Status  On-going      PT LONG TERM GOAL #2   Title  stand to cook meals wihtout sitting to rest    Status  On-going      PT LONG TERM GOAL #3   Title  Patient able to ascend/descend stairs at church with minimal pain.      PT LONG TERM GOAL #4  Title  Patient to demo 5/5 BLE strength to improve function.    Status  On-going      PT LONG TERM GOAL #5   Title  New goal added 03/25/18: Pt will be able to ambulate one lap around PT building and stairs without needing rest break, to show improved endurance for community ambulation.     Status  On-going            Plan - 03/07/19 1424    Clinical Impression Statement  Pt did well overall with today's interventions. She did report some tolerable L shoulder pain with seated rows and lats. Standing hip extensions did cause pt to report some low back tightness. Cues needed to slide to edge of chair with sit to stands.    Comorbidities  GERD, IBS, lumbar surgery    Stability/Clinical Decision Making  Stable/Uncomplicated    Rehab Potential  Good    PT Frequency  2x / week    PT Duration  8 weeks    PT Treatment/Interventions  ADLs/Self Care Home Management;Cryotherapy;Electrical Stimulation;Iontophoresis 4mg /ml Dexamethasone;Moist Heat;Ultrasound;Therapeutic activities;Therapeutic exercise;Neuromuscular re-education;Patient/family education;Dry needling    PT Next Visit Plan  Core strength and stability,  STM to low back. Some MT to LUE       Patient will benefit from skilled therapeutic intervention in order to  improve the following deficits and impairments:  Pain, Improper body mechanics, Postural dysfunction, Increased muscle spasms, Decreased activity tolerance, Decreased range of motion, Decreased strength, Difficulty walking, Impaired flexibility  Visit Diagnosis: Muscle spasm of back  Difficulty in walking, not elsewhere classified  Muscle weakness (generalized)  Acute right-sided low back pain without sciatica     Problem List Patient Active Problem List   Diagnosis Date Noted  . Febrile illness 01/06/2019  . On statin therapy due to risk of future cardiovascular event 12/01/2017  . Spondylolisthesis at L5-S1 level 08/13/2017  . Colon polyp 05/25/2017  . Ophthalmic migraine 05/25/2017  . Essential tremor 05/25/2017  . Allergic asthma, mild intermittent, uncomplicated 0000000  . Obesity, Class I, BMI 30-34.9 04/16/2015  . Conjunctivitis, allergic, chronic 10/01/2012  . Hypertriglyceridemia 09/22/2011  . Rosacea 06/25/2011  . Irritable bowel syndrome with diarrhea 07/08/2010  . Benign essential hypertension 05/23/2010  . CKD (chronic kidney disease) stage 3, GFR 30-59 ml/min (HCC) 05/23/2010  . Chronic allergic rhinitis 02/25/2010  . GERD (gastroesophageal reflux disease) 02/25/2010  . Psoriasis 02/25/2010  . Osteoarthritis, multiple sites 06/12/2009    Scot Jun, PTA 03/07/2019, 2:27 PM  [place], [name], [address] Phone: [phone]   Fax:  [phone]  Name: Sabrina Ramirez MRN: [id number] Date of Birth: [birth year]

## 2019-03-08 ENCOUNTER — Encounter: Payer: Self-pay | Admitting: Family Medicine

## 2019-03-08 ENCOUNTER — Ambulatory Visit (INDEPENDENT_AMBULATORY_CARE_PROVIDER_SITE_OTHER): Payer: Medicare PPO | Admitting: Family Medicine

## 2019-03-08 ENCOUNTER — Ambulatory Visit (INDEPENDENT_AMBULATORY_CARE_PROVIDER_SITE_OTHER): Payer: Medicare PPO

## 2019-03-08 VITALS — BP 140/72 | HR 62 | Temp 97.7°F | Resp 16 | Ht 64.0 in | Wt 195.4 lb

## 2019-03-08 DIAGNOSIS — E669 Obesity, unspecified: Secondary | ICD-10-CM

## 2019-03-08 DIAGNOSIS — E781 Pure hyperglyceridemia: Secondary | ICD-10-CM | POA: Diagnosis not present

## 2019-03-08 DIAGNOSIS — N183 Chronic kidney disease, stage 3 unspecified: Secondary | ICD-10-CM

## 2019-03-08 DIAGNOSIS — Z79899 Other long term (current) drug therapy: Secondary | ICD-10-CM

## 2019-03-08 DIAGNOSIS — I10 Essential (primary) hypertension: Secondary | ICD-10-CM | POA: Diagnosis not present

## 2019-03-08 DIAGNOSIS — Z1231 Encounter for screening mammogram for malignant neoplasm of breast: Secondary | ICD-10-CM

## 2019-03-08 DIAGNOSIS — J309 Allergic rhinitis, unspecified: Secondary | ICD-10-CM

## 2019-03-08 DIAGNOSIS — M25512 Pain in left shoulder: Secondary | ICD-10-CM

## 2019-03-08 DIAGNOSIS — G8929 Other chronic pain: Secondary | ICD-10-CM

## 2019-03-08 DIAGNOSIS — M4317 Spondylolisthesis, lumbosacral region: Secondary | ICD-10-CM

## 2019-03-08 DIAGNOSIS — E1065 Type 1 diabetes mellitus with hyperglycemia: Secondary | ICD-10-CM

## 2019-03-08 DIAGNOSIS — Z0000 Encounter for general adult medical examination without abnormal findings: Secondary | ICD-10-CM

## 2019-03-08 DIAGNOSIS — Z23 Encounter for immunization: Secondary | ICD-10-CM

## 2019-03-08 DIAGNOSIS — M1812 Unilateral primary osteoarthritis of first carpometacarpal joint, left hand: Secondary | ICD-10-CM

## 2019-03-08 DIAGNOSIS — K219 Gastro-esophageal reflux disease without esophagitis: Secondary | ICD-10-CM | POA: Diagnosis not present

## 2019-03-08 LAB — CBC WITH DIFFERENTIAL/PLATELET
Basophils Absolute: 0.1 10*3/uL (ref 0.0–0.1)
Basophils Relative: 0.9 % (ref 0.0–3.0)
Eosinophils Absolute: 0.4 10*3/uL (ref 0.0–0.7)
Eosinophils Relative: 5.8 % — ABNORMAL HIGH (ref 0.0–5.0)
HCT: 37 % (ref 36.0–46.0)
Hemoglobin: 12.4 g/dL (ref 12.0–15.0)
Lymphocytes Relative: 26.5 % (ref 12.0–46.0)
Lymphs Abs: 2 10*3/uL (ref 0.7–4.0)
MCHC: 33.4 g/dL (ref 30.0–36.0)
MCV: 94.2 fl (ref 78.0–100.0)
Monocytes Absolute: 0.6 10*3/uL (ref 0.1–1.0)
Monocytes Relative: 7.5 % (ref 3.0–12.0)
Neutro Abs: 4.5 10*3/uL (ref 1.4–7.7)
Neutrophils Relative %: 59.3 % (ref 43.0–77.0)
Platelets: 220 10*3/uL (ref 150.0–400.0)
RBC: 3.93 Mil/uL (ref 3.87–5.11)
RDW: 13.6 % (ref 11.5–15.5)
WBC: 7.6 10*3/uL (ref 4.0–10.5)

## 2019-03-08 LAB — LIPID PANEL
Cholesterol: 124 mg/dL (ref 0–200)
HDL: 41.8 mg/dL (ref >39.00)
LDL Cholesterol: 50 mg/dL (ref 0–99)
NonHDL: 82
Total CHOL/HDL Ratio: 3
Triglycerides: 159 mg/dL — ABNORMAL HIGH (ref 0.0–149.0)
VLDL: 31.8 mg/dL (ref 0.0–40.0)

## 2019-03-08 LAB — COMPREHENSIVE METABOLIC PANEL
ALT: 14 U/L (ref 0–35)
AST: 16 U/L (ref 0–37)
Albumin: 4 g/dL (ref 3.5–5.2)
Alkaline Phosphatase: 78 U/L (ref 39–117)
BUN: 29 mg/dL — ABNORMAL HIGH (ref 6–23)
CO2: 24 mEq/L (ref 19–32)
Calcium: 9.2 mg/dL (ref 8.4–10.5)
Chloride: 109 mEq/L (ref 96–112)
Creatinine, Ser: 1.85 mg/dL — ABNORMAL HIGH (ref 0.40–1.20)
GFR: 26.38 mL/min — ABNORMAL LOW (ref >60.00)
Glucose, Bld: 115 mg/dL — ABNORMAL HIGH (ref 70–99)
Potassium: 4.2 mEq/L (ref 3.5–5.1)
Sodium: 143 mEq/L (ref 135–145)
Total Bilirubin: 0.7 mg/dL (ref 0.2–1.2)
Total Protein: 6.8 g/dL (ref 6.0–8.3)

## 2019-03-08 LAB — TSH: TSH: 2.91 u[IU]/mL (ref 0.35–4.50)

## 2019-03-08 MED ORDER — TRIAMCINOLONE ACETONIDE 40 MG/ML IJ SUSP
40.0000 mg | Freq: Once | INTRAMUSCULAR | Status: AC
  Administered 2019-03-08: 40 mg via INTRAMUSCULAR

## 2019-03-08 MED ORDER — DICLOFENAC SODIUM 1 % TD GEL: 2.0000 g | Freq: Four times a day (QID) | TRANSDERMAL | 5 refills | Status: DC

## 2019-03-08 NOTE — Patient Instructions (Addendum)
Please return in 6 months for follow up of your hypertension. Sooner if your shoulder continues to be a problem.   Medicare recommends an Annual Wellness Visit for all patients. Please schedule this to be done with our Nurse Educator, Loma Sousa. This is an informative "talk" visit; it's goals are to ensure that your health care needs are being met and to give you education regarding avoiding falls, ensuring you are not suffering from depression or problems with memory or thinking, and to educate you on Advance Care Planning. It helps me take good care of you!  I will release your lab results to you on your MyChart account with further instructions. Please reply with any questions.   I've ordered your mammogram. They will call you to schedule.  I've ordered PT for your shoulder.   Today you were given your flu vaccination.   If you have any questions or concerns, please don't hesitate to send me a message via MyChart or call the office at [phone]. Thank you for visiting with [place] today! It's our pleasure caring for you.  You had a steroid injection today.   Things to be aware of after this injection are listed below:  You may experience no significant improvement or even a slight worsening in your symptoms during the first 24 to 48 hours.  After that we expect your symptoms to improve gradually over the next 2 weeks for the medicine to have its maximal effect.  You should continue to have improvement out to 6 weeks after your injection.  I recommend icing the site of the injection for 20 minutes  1-2 times the [place] of your injection  You may shower but no swimming, tub bath or Jacuzzi for 24 hours.  If your bandage falls off this does not need to be replaced.  It is appropriate to remove the bandage after 4 hours.  You may resume light activities as tolerated.     POSSIBLE PROCEDURE SIDE EFFECTS: The side effects of the injection are usually fairly minimal however if you may experience  some of the following side effects that are usually self-limited and will is off on their own.  If you are concerned please feel free to call the office with questions:             Increased numbness or tingling             Nausea or vomiting             Swelling or bruising at the injection site    Please call our office if if you experience any of the following symptoms over the next week as these can be signs of infection:              Fever greater than 100.64F             Significant swelling at the injection site             Significant redness or drainage from the injection site

## 2019-03-08 NOTE — Progress Notes (Signed)
Subjective  Chief Complaint  Patient presents with  . Annual Exam    Fasting  . Hypertension  . Hyperlipidemia    HPI: Sabrina Ramirez is a 78 y.o. female who presents to [facility] today for a Female Wellness Visit. She also has the concerns and/or needs as listed above in the chief complaint. These will be addressed in addition to the Health Maintenance Visit.   Wellness Visit: annual visit with health maintenance review and exam without Pap   HM: flu shot today. Other screens are up to date. Doing well overall. Working with PT: back is much improved Chronic disease f/u and/or acute problem visit: (deemed necessary to be done in addition to the wellness visit):  HTN has been well controlled. Feeling well. Taking medications w/o adverse effects. No symptoms of CHF, angina; no palpitations, sob, cp or lower extremity edema. Compliant with meds.   HLD: on statin, low dose. Due for recheck.   Ckd: asymptomatic. Monitoring. Not on ace or arb due to allergies  Asthma per specialist and controlled.   Chronic left shoulder pain: started 10 years ago!Marland Kitchen Now with 2 weeks of daily pain. Has limited rom. No injury. Can't raise arm above head w/o pain. No neck pain or radicular sxs. No weakness. Has never had it evaluated before.   GERD: stable on meds.   Assessment  1. Annual physical exam   2. Benign essential hypertension   3. Hypertriglyceridemia   4. On statin therapy due to risk of future cardiovascular event   5. CKD (chronic kidney disease) stage 3, GFR 30-59 ml/min (HCC)   6. Obesity, Class I, BMI 30-34.9   7. Chronic allergic rhinitis   8. Spondylolisthesis at L5-S1 level   9. Gastroesophageal reflux disease, esophagitis presence not specified   10. Encounter for screening mammogram for breast cancer   11. Localized primary osteoarthritis of carpometacarpal joint of left thumb   12. Chronic left shoulder pain   13. Need for immunization against  influenza      Plan  Female Wellness Visit:  Age appropriate Health Maintenance and Prevention measures were discussed with patient. Included topics are cancer screening recommendations, ways to keep healthy (see AVS) including dietary and exercise recommendations, regular eye and dental care, use of seat belts, and avoidance of moderate alcohol use and tobacco use. Mammogram ordered.   BMI: discussed patient's BMI and encouraged positive lifestyle modifications to help get to or maintain a target BMI.  HM needs and immunizations were addressed and ordered. See below for orders. See HM and immunization section for updates. Flu shot today.   Routine labs and screening tests ordered including cmp, cbc and lipids where appropriate.  Discussed recommendations regarding Vit D and calcium supplementation (see AVS)  Chronic disease management visit and/or acute problem visit:  HTN is controlled. Check renal function and electrolytes.  On statin: check lipids and lfts. Tolerating very low dose crestor.   Ckd: monitoring.   Left shoulder pain:s/p steroid injection today. Routine post procedure care instructions were given to patient in detail. Start PE. F/u if not improving. Suspect chronic RC pathology and/or arthritis.  Gerd: chronic PPI.   Follow up: Return in about 6 months (around 09/05/2019) for follow up Hypertension.  Due AWV.  Orders Placed This Encounter  Procedures  . MM DIGITAL SCREENING BILATERAL  . DG Shoulder Left  . Flu Vaccine QUAD High Dose(Fluad)  . CBC with Differential/Platelet  . Comprehensive metabolic panel  .  Lipid panel  . TSH  . Ambulatory referral to Physical Therapy   Meds ordered this encounter  Medications  . diclofenac sodium (VOLTAREN) 1 % GEL    Sig: Apply 2 g topically 4 (four) times daily.    Dispense:  100 g    Refill:  5  . triamcinolone acetonide (KENALOG-40) injection 40 mg      Lifestyle: Body mass index is 33.54 kg/m. Wt Readings  from Last 3 Encounters:  03/08/19 195 lb 6.4 oz (88.6 kg)  12/29/18 192 lb 9.6 oz (87.4 kg)  08/11/18 197 lb (89.4 kg)   Diet: general Exercise: rarely,   Patient Active Problem List   Diagnosis Date Noted  . Obesity, Class I, BMI 30-34.9 04/16/2015    Priority: High  . Hypertriglyceridemia 09/22/2011    Priority: High  . Benign essential hypertension 05/23/2010    Priority: High  . CKD (chronic kidney disease) stage 3, GFR 30-59 ml/min (HCC) 05/23/2010    Priority: High    Overview:  Nl SPEP,UPEP and mild cortical thinning on renal ultrasound 2014   . On statin therapy due to risk of future cardiovascular event 12/01/2017    Priority: Medium  . Spondylolisthesis at L5-S1 level 08/13/2017    Priority: Medium  . Colon polyp 05/25/2017    Priority: Medium    Overview:  1968, none on subsequent colonoscopy, [facility], last 2012   . Ophthalmic migraine 05/25/2017    Priority: Medium  . Essential tremor 05/25/2017    Priority: Medium  . Allergic asthma, mild intermittent, uncomplicated 0000000    Priority: Medium    Dr. Donneta Romberg, allergist   . Irritable bowel syndrome with diarrhea 07/08/2010    Priority: Medium  . Chronic allergic rhinitis 02/25/2010    Priority: Medium  . GERD (gastroesophageal reflux disease) 02/25/2010    Priority: Medium    Overview:  On chronic PPI since early 2000s. Neg H.pylor   . Osteoarthritis, multiple sites 06/12/2009    Priority: Medium  . Conjunctivitis, allergic, chronic 10/01/2012    Priority: Low  . Rosacea 06/25/2011    Priority: Low  . Psoriasis 02/25/2010    Priority: Low  . Localized primary osteoarthritis of carpometacarpal joint of left thumb 03/08/2019  . Febrile illness 01/06/2019   Health Maintenance  Topic Date Due  . INFLUENZA VACCINE  01/08/2019  . TETANUS/TDAP  05/23/2020  . DEXA SCAN  04/28/2023  . PNA vac Low Risk Adult  Completed   Immunization History  Administered Date(s) Administered  . Fluad Quad(high Dose  65+) 03/08/2019  . Influenza, High Dose Seasonal PF 03/02/2012, 04/06/2013, 04/21/2014, 04/16/2015, 03/06/2016, 04/09/2017, 03/01/2018  . Influenza-Unspecified 04/12/2011  . Pneumococcal Conjugate-13 10/05/2014  . Pneumococcal Polysaccharide-23 04/23/2010  . Pneumococcal-Unspecified 04/23/2010  . Tdap 05/23/2010  . Zoster 06/09/2004  . Zoster Recombinat (Shingrix) 11/18/2017, 03/23/2018   We updated and reviewed the patient's past history in detail and it is documented below. Allergies: Patient is allergic to amlodipine; flexeril [cyclobenzaprine]; ace inhibitors; adhesive [tape]; angiotensin receptor blockers; corn-containing products; iodine; and triamterene-hctz. Past Medical History Patient  has a past medical history of Allergic asthma, mild intermittent, uncomplicated (0000000), Arthritis, Chronic kidney disease, GERD (gastroesophageal reflux disease), Hemorrhoids, History of kidney stones, Hypertension, IBS (irritable bowel syndrome), and Psoriasis. Past Surgical History Patient  has a past surgical history that includes Cholecystectomy; Hemorrhoid surgery; Cataract extraction w/ intraocular lens  implant, bilateral (Bilateral, 2015); Abdominal hysterectomy (1987); Tonsillectomy; and Spine surgery (07/10/2017). Family History: Patient family history includes Arthritis in her  maternal aunt; COPD in her maternal aunt and maternal grandmother; Deep vein thrombosis in her maternal grandfather and paternal grandmother; Diabetes in her father, paternal uncle, and sister; Heart attack in her father; Heart disease in her father, maternal grandmother, and paternal uncle; Hyperlipidemia in her father; Hypertension in her daughter, father, maternal aunt, paternal grandmother, and sister; Stroke in her father, maternal grandfather, mother, and paternal uncle; Transient ischemic attack in her sister. Social History:  Patient  reports that she has never smoked. She has never used smokeless tobacco.  She reports current alcohol use. She reports that she does not use drugs.  Review of Systems: Constitutional: negative for fever or malaise Ophthalmic: negative for photophobia, double vision or loss of vision Cardiovascular: negative for chest pain, dyspnea on exertion, or new LE swelling Respiratory: negative for SOB or persistent cough Gastrointestinal: negative for abdominal pain, change in bowel habits or melena Genitourinary: negative for dysuria or gross hematuria, no abnormal uterine bleeding or disharge Musculoskeletal: negative for new gait disturbance or muscular weakness Integumentary: negative for new or persistent rashes, no breast lumps Neurological: negative for TIA or stroke symptoms Psychiatric: negative for SI or delusions Allergic/Immunologic: negative for hives  Patient Care Team    Relationship Specialty Notifications Start End  Leamon Arnt, MD PCP - General Family Medicine  05/25/17   Mosetta Anis, MD Referring Physician Allergy  05/25/17   Consuella Lose, MD Consulting Physician Neurosurgery  10/28/17   Mottinger, Sharyn Lull  Dentistry  10/28/17     Objective  Vitals: BP 140/72   Pulse 62   Temp 97.7 F (36.5 C) (Tympanic)   Resp 16   Ht 5\' 4"  (1.626 m)   Wt 195 lb 6.4 oz (88.6 kg)   SpO2 96%   BMI 33.54 kg/m  General:  Well developed, well nourished, no acute distress  Psych:  Alert and orientedx3,normal mood and affect HEENT:  Normocephalic, atraumatic, non-icteric sclera, PERRL, oropharynx is clear without mass or exudate, supple neck without adenopathy, mass or thyromegaly Cardiovascular:  Normal S1, S2, RRR without gallop, rub or murmur, nondisplaced PMI Respiratory:  Good breath sounds bilaterally, CTAB with normal respiratory effort Gastrointestinal: normal bowel sounds, soft, non-tender, no noted masses. No HSM MSK: no deformities, contusions. Joints are without erythema or swelling. Spine and CVA region are nontender. Left shoulder with  limited passive and AROM: extension to 120. Limited internal rotation / abduction. + empty can testing. Neg drop test. Skin:  Warm, no rashes or suspicious lesions noted Neurologic:    Mental status is normal. CN 2-11 are normal. Gross motor and sensory exams are normal. Normal gait. No tremor Breast Exam: No mass, skin retraction or nipple discharge is appreciated in either breast. No axillary adenopathy. Fibrocystic changes are not noted  Shoulder Injection Procedure Note  Pre-operative Diagnosis: left RC pathology; shoulder pain  Post-operative Diagnosis: same  Indications: Symptomatic relief and failed conservative measures  Anesthesia: Lidocaine 1% without epinephrine without added sodium bicarbonate  Procedure Details   Verbal consent was obtained for the procedure. The shoulder was prepped with alcohol  and the skin was anesthetized with cold spray. Using a 22 gauge needle the subacromial space was injected with 4 mL 1% lidocaine and 1 mL of triamcinolone (KENALOG) 40mg /ml under the posterior aspect of the acromion. The injection site was cleansed with topical isopropyl alcohol and a dressing was applied.  Complications:  None; patient tolerated the procedure well. Pain decreased after injection.     Commons side effects, risks,  benefits, and alternatives for medications and treatment plan prescribed today were discussed, and the patient expressed understanding of the given instructions. Patient is instructed to call or message via MyChart if he/she has any questions or concerns regarding our treatment plan. No barriers to understanding were identified. We discussed Red Flag symptoms and signs in detail. Patient expressed understanding regarding what to do in case of urgent or emergency type symptoms.   Medication list was reconciled, printed and provided to the patient in AVS. Patient instructions and summary information was reviewed with the patient as documented in the AVS. This  note was prepared with assistance of Dragon voice recognition software. Occasional wrong-word or sound-a-like substitutions may have occurred due to the inherent limitations of voice recognition software

## 2019-03-09 ENCOUNTER — Encounter: Payer: Self-pay | Admitting: Family Medicine

## 2019-03-09 ENCOUNTER — Other Ambulatory Visit (INDEPENDENT_AMBULATORY_CARE_PROVIDER_SITE_OTHER): Payer: Medicare PPO

## 2019-03-09 DIAGNOSIS — R739 Hyperglycemia, unspecified: Secondary | ICD-10-CM

## 2019-03-09 DIAGNOSIS — R7301 Impaired fasting glucose: Secondary | ICD-10-CM

## 2019-03-09 HISTORY — DX: Impaired fasting glucose: R73.01

## 2019-03-09 LAB — HEMOGLOBIN A1C: Hgb A1c MFr Bld: 6.3 % (ref 4.6–6.5)

## 2019-03-09 NOTE — Addendum Note (Signed)
Addended by: Francis Dowse T on: 03/09/2019 09:10 AM   Modules accepted: Orders

## 2019-03-09 NOTE — Progress Notes (Signed)
Please add on A1c, dx: hyperglycemia Thanks, Dr. Jonni Sanger '

## 2019-03-10 ENCOUNTER — Other Ambulatory Visit: Payer: Self-pay

## 2019-03-10 ENCOUNTER — Ambulatory Visit: Payer: Medicare PPO | Attending: Family Medicine | Admitting: Physical Therapy

## 2019-03-10 ENCOUNTER — Encounter: Payer: Self-pay | Admitting: Physical Therapy

## 2019-03-10 DIAGNOSIS — G8929 Other chronic pain: Secondary | ICD-10-CM | POA: Diagnosis not present

## 2019-03-10 DIAGNOSIS — M6281 Muscle weakness (generalized): Secondary | ICD-10-CM | POA: Diagnosis not present

## 2019-03-10 DIAGNOSIS — M25512 Pain in left shoulder: Secondary | ICD-10-CM | POA: Diagnosis not present

## 2019-03-10 DIAGNOSIS — M62830 Muscle spasm of back: Secondary | ICD-10-CM | POA: Diagnosis not present

## 2019-03-10 DIAGNOSIS — M545 Low back pain, unspecified: Secondary | ICD-10-CM

## 2019-03-10 DIAGNOSIS — M25612 Stiffness of left shoulder, not elsewhere classified: Secondary | ICD-10-CM | POA: Insufficient documentation

## 2019-03-10 DIAGNOSIS — R262 Difficulty in walking, not elsewhere classified: Secondary | ICD-10-CM | POA: Insufficient documentation

## 2019-03-10 DIAGNOSIS — M2569 Stiffness of other specified joint, not elsewhere classified: Secondary | ICD-10-CM | POA: Insufficient documentation

## 2019-03-10 NOTE — Therapy (Signed)
Sabrina Ramirez, [name], [address] Phone: [phone]   Fax:  [phone]  Physical Therapy Treatment  Patient Details  Name: Sabrina Ramirez MRN: [id number] Date of Birth: [birth year] Referring Provider (PT): Billey Chang   Encounter Date: 03/10/2019  PT End of Session - 03/10/19 1425    Visit Number  9    Date for PT Re-Evaluation  03/28/19    Authorization Type  [employer]    PT Start Time  [id number]    PT Stop Time  1425    PT Time Calculation (min)  40 min    Activity Tolerance  Patient tolerated treatment well    Behavior During Therapy  [facility] for tasks assessed/performed       Past Medical History:  Diagnosis Date  . Allergic asthma, mild intermittent, uncomplicated 0000000   Dr. Donneta Romberg, allergist  . Arthritis   . Chronic kidney disease    Stage III kidney disease  . GERD (gastroesophageal reflux disease)   . Hemorrhoids   . History of kidney stones   . Hypertension   . IBS (irritable bowel syndrome)   . IFG (impaired fasting glucose) 03/09/2019   a1c 6.3 02/2019  . Psoriasis     Past Surgical History:  Procedure Laterality Date  . ABDOMINAL HYSTERECTOMY  1987  . CATARACT EXTRACTION W/ INTRAOCULAR LENS  IMPLANT, BILATERAL Bilateral 2015  . CHOLECYSTECTOMY    . HEMORRHOID SURGERY    . SPINE SURGERY  07/10/2017  . TONSILLECTOMY      There were no vitals filed for this visit.  Subjective Assessment - 03/10/19 1352    Subjective  Pt reports that she went to the MD and will be getting a therapy order for her shoulder. Back is about a 2 today    Pertinent History  had a fusion 3 level early 2019    Currently in Pain?  Yes    Pain Score  3     Pain Location  Back                       [facility] Adult PT Treatment/Exercise - 03/10/19 0001      Lumbar Exercises: Stretches   Passive Hamstring Stretch  Right;Left;4 reps;10 seconds;20 seconds    Single Knee to Chest Stretch   Right;Left;3 reps;20 seconds;10 seconds      Lumbar Exercises: Aerobic   UBE (Upper Arm Bike)  L2 x 4 min     Nustep  L3 x 5 min      Lumbar Exercises: Machines for Strengthening   Cybex Knee Extension  5lb 2x15     Cybex Knee Flexion  20lb 2x15      Lumbar Exercises: Standing   Other Standing Lumbar Exercises  Hip flex, ext & abd 1.5lb x15               PT Short Term Goals - 02/03/19 1651      PT SHORT TERM GOAL #1   Title  Patient independent with HEP    Status  Achieved        PT Long Term Goals - 02/21/19 1437      PT LONG TERM GOAL #1   Title  Patient able to walk to grocery shop without pain >4/10    Status  On-going      PT LONG TERM GOAL #2   Title  stand to cook meals wihtout sitting to rest  Status  On-going      PT LONG TERM GOAL #3   Title  Patient able to ascend/descend stairs at church with minimal pain.      PT LONG TERM GOAL #4   Title  Patient to demo 5/5 BLE strength to improve function.    Status  On-going      PT LONG TERM GOAL #5   Title  New goal added 03/25/18: Pt will be able to ambulate one lap around PT building and stairs without needing rest break, to show improved endurance for community ambulation.     Status  On-going            Plan - 03/10/19 1426    Clinical Impression Statement  Pt reports an overall improvement with her back pain. Less low back tightness noted with standing hip extensions. Rest needed in each set of leg extensions with the increase in reps. Some HS tightness noted with passive stretches. No reports of increase low back pain reported in today session.    Stability/Clinical Decision Making  Stable/Uncomplicated    Rehab Potential  Good    PT Frequency  2x / week    PT Duration  8 weeks    PT Treatment/Interventions  ADLs/Self Care Home Management;Cryotherapy;Electrical Stimulation;Iontophoresis 4mg /ml Dexamethasone;Moist Heat;Ultrasound;Therapeutic activities;Therapeutic exercise;Neuromuscular  re-education;Patient/family education;Dry needling    PT Next Visit Plan  Core strength and stability,  STM to low back. Some MT to LUE       Patient will benefit from skilled therapeutic intervention in order to improve the following deficits and impairments:  Pain, Improper body mechanics, Postural dysfunction, Increased muscle spasms, Decreased activity tolerance, Decreased range of motion, Decreased strength, Difficulty walking, Impaired flexibility  Visit Diagnosis: Difficulty in walking, not elsewhere classified  Muscle spasm of back  Muscle weakness (generalized)  Acute right-sided low back pain without sciatica     Problem List Patient Active Problem List   Diagnosis Date Noted  . IFG (impaired fasting glucose) 03/09/2019  . Localized primary osteoarthritis of carpometacarpal joint of left thumb 03/08/2019  . On statin therapy due to risk of future cardiovascular event 12/01/2017  . Spondylolisthesis at L5-S1 level 08/13/2017  . Colon polyp 05/25/2017  . Ophthalmic migraine 05/25/2017  . Essential tremor 05/25/2017  . Allergic asthma, mild intermittent, uncomplicated 0000000  . Obesity, Class I, BMI 30-34.9 04/16/2015  . Conjunctivitis, allergic, chronic 10/01/2012  . Hypertriglyceridemia 09/22/2011  . Rosacea 06/25/2011  . Irritable bowel syndrome with diarrhea 07/08/2010  . Benign essential hypertension 05/23/2010  . CKD (chronic kidney disease) stage 3, GFR 30-59 ml/min 05/23/2010  . Chronic allergic rhinitis 02/25/2010  . GERD (gastroesophageal reflux disease) 02/25/2010  . Psoriasis 02/25/2010  . Osteoarthritis, multiple sites 06/12/2009    Scot Jun, PTA 03/10/2019, 2:29 PM  [place], [name], [address] Phone: [phone]   Fax:  [phone]  Name: Sabrina Ramirez MRN: [id number] Date of Birth: 1940/11/10

## 2019-03-11 ENCOUNTER — Ambulatory Visit (INDEPENDENT_AMBULATORY_CARE_PROVIDER_SITE_OTHER): Payer: Medicare PPO

## 2019-03-11 DIAGNOSIS — Z0000 Encounter for general adult medical examination without abnormal findings: Secondary | ICD-10-CM

## 2019-03-11 NOTE — Patient Instructions (Signed)
Sabrina Ramirez , Thank you for taking time to come for your Medicare Wellness Visit. I appreciate your ongoing commitment to your health goals. Please review the following plan we discussed and let me know if I can assist you in the future.   Screening recommendations/referrals: Colorectal Screening: not indicated  Mammogram: ordered and to be scheduled  Bone Density: up to date; last 04/27/18  Vision and Dental Exams: Recommended annual ophthalmology exams for early detection of glaucoma and other disorders of the eye Recommended annual dental exams for proper oral hygiene  Vaccinations: Influenza vaccine: completed  Pneumococcal vaccine: last 10/05/14  Tdap vaccine: up to date; last 05/23/10  Shingles vaccine: Shingrix completed   Advanced directives: Please bring a copy of your POA (Power of [place]) and/or Living Will to your next appointment.  Goals: Recommend to remove any items from the home that may cause slips or trips.  Next appointment: Please schedule your Annual Wellness Visit with your Nurse Health Advisor in one year.  Preventive Care 75 Years and Older, Female Preventive care refers to lifestyle choices and visits with your health care provider that can promote health and wellness. What does preventive care include?  A yearly physical exam. This is also called an annual well check.  Dental exams once or twice a year.  Routine eye exams. Ask your health care provider how often you should have your eyes checked.  Personal lifestyle choices, including:  Daily care of your teeth and gums.  Regular physical activity.  Eating a healthy diet.  Avoiding tobacco and drug use.  Limiting alcohol use.  Practicing safe sex.  Taking low-dose aspirin every [place] if recommended by your health care provider.  Taking vitamin and mineral supplements as recommended by your health care provider. What happens during an annual well check? The services and screenings done by your  health care provider during your annual well check will depend on your age, overall health, lifestyle risk factors, and family history of disease. Counseling  Your health care provider may ask you questions about your:  Alcohol use.  Tobacco use.  Drug use.  Emotional well-being.  Home and relationship well-being.  Sexual activity.  Eating habits.  History of falls.  Memory and ability to understand (cognition).  Work and work Statistician.  Reproductive health. Screening  You may have the following tests or measurements:  Height, weight, and BMI.  Blood pressure.  Lipid and cholesterol levels. These may be checked every 5 years, or more frequently if you are over 63 years old.  Skin check.  Lung cancer screening. You may have this screening every year starting at age 5 if you have a 30-pack-year history of smoking and currently smoke or have quit within the past 15 years.  Fecal occult blood test (FOBT) of the stool. You may have this test every year starting at age 37.  Flexible sigmoidoscopy or colonoscopy. You may have a sigmoidoscopy every 5 years or a colonoscopy every 10 years starting at age 74.  Hepatitis C blood test.  Hepatitis B blood test.  Sexually transmitted disease (STD) testing.  Diabetes screening. This is done by checking your blood sugar (glucose) after you have not eaten for a while (fasting). You may have this done every 1-3 years.  Bone density scan. This is done to screen for osteoporosis. You may have this done starting at age 1.  Mammogram. This may be done every 1-2 years. Talk to your health care provider about how often you should  have regular mammograms. Talk with your health care provider about your test results, treatment options, and if necessary, the need for more tests. Vaccines  Your health care provider may recommend certain vaccines, such as:  Influenza vaccine. This is recommended every year.  Tetanus, diphtheria, and  acellular pertussis (Tdap, Td) vaccine. You may need a Td booster every 10 years.  Zoster vaccine. You may need this after age 77.  Pneumococcal 13-valent conjugate (PCV13) vaccine. One dose is recommended after age 25.  Pneumococcal polysaccharide (PPSV23) vaccine. One dose is recommended after age 42. Talk to your health care provider about which screenings and vaccines you need and how often you need them. This information is not intended to replace advice given to you by your health care provider. Make sure you discuss any questions you have with your health care provider. Document Released: 06/22/2015 Document Revised: 02/13/2016 Document Reviewed: 03/27/2015 Elsevier Interactive Patient Education  2017 [employer] Prevention in the Home Falls can cause injuries. They can happen to people of all ages. There are many things you can do to make your home safe and to help prevent falls. What can I do on the outside of my home?  Regularly fix the edges of walkways and driveways and fix any cracks.  Remove anything that might make you trip as you walk through a door, such as a raised step or threshold.  Trim any bushes or trees on the path to your home.  Use bright outdoor lighting.  Clear any walking paths of anything that might make someone trip, such as rocks or tools.  Regularly check to see if handrails are loose or broken. Make sure that both sides of any steps have handrails.  Any raised decks and porches should have guardrails on the edges.  Have any leaves, snow, or ice cleared regularly.  Use sand or salt on walking paths during winter.  Clean up any spills in your garage right away. This includes oil or grease spills. What can I do in the bathroom?  Use night lights.  Install grab bars by the toilet and in the tub and shower. Do not use towel bars as grab bars.  Use non-skid mats or decals in the tub or shower.  If you need to sit down in the shower, use  a plastic, non-slip stool.  Keep the floor dry. Clean up any water that spills on the floor as soon as it happens.  Remove soap buildup in the tub or shower regularly.  Attach bath mats securely with double-sided non-slip rug tape.  Do not have throw rugs and other things on the floor that can make you trip. What can I do in the bedroom?  Use night lights.  Make sure that you have a light by your bed that is easy to reach.  Do not use any sheets or blankets that are too big for your bed. They should not hang down onto the floor.  Have a firm chair that has side arms. You can use this for support while you get dressed.  Do not have throw rugs and other things on the floor that can make you trip. What can I do in the kitchen?  Clean up any spills right away.  Avoid walking on wet floors.  Keep items that you use a lot in easy-to-reach places.  If you need to reach something above you, use a strong step stool that has a grab bar.  Keep electrical cords out of  the way.  Do not use floor polish or wax that makes floors slippery. If you must use wax, use non-skid floor wax.  Do not have throw rugs and other things on the floor that can make you trip. What can I do with my stairs?  Do not leave any items on the stairs.  Make sure that there are handrails on both sides of the stairs and use them. Fix handrails that are broken or loose. Make sure that handrails are as long as the stairways.  Check any carpeting to make sure that it is firmly attached to the stairs. Fix any carpet that is loose or worn.  Avoid having throw rugs at the top or bottom of the stairs. If you do have throw rugs, attach them to the floor with carpet tape.  Make sure that you have a light switch at the top of the stairs and the bottom of the stairs. If you do not have them, ask someone to add them for you. What else can I do to help prevent falls?  Wear shoes that:  Do not have high heels.  Have  rubber bottoms.  Are comfortable and fit you well.  Are closed at the toe. Do not wear sandals.  If you use a stepladder:  Make sure that it is fully opened. Do not climb a closed stepladder.  Make sure that both sides of the stepladder are locked into place.  Ask someone to hold it for you, if possible.  Clearly mark and make sure that you can see:  Any grab bars or handrails.  First and last steps.  Where the edge of each step is.  Use tools that help you move around (mobility aids) if they are needed. These include:  Canes.  Walkers.  Scooters.  Crutches.  Turn on the lights when you go into a dark area. Replace any light bulbs as soon as they burn out.  Set up your furniture so you have a clear path. Avoid moving your furniture around.  If any of your floors are uneven, fix them.  If there are any pets around you, be aware of where they are.  Review your medicines with your doctor. Some medicines can make you feel dizzy. This can increase your chance of falling. Ask your doctor what other things that you can do to help prevent falls. This information is not intended to replace advice given to you by your health care provider. Make sure you discuss any questions you have with your health care provider. Document Released: 03/22/2009 Document Revised: 11/01/2015 Document Reviewed: 06/30/2014 Elsevier Interactive Patient Education  2017 [employer].

## 2019-03-11 NOTE — Progress Notes (Signed)
This visit is being conducted via phone call due to the COVID-19 pandemic. This patient has given me verbal consent via phone to conduct this visit, patient states they are participating from their home address. Some vital signs may be absent or patient reported.   Patient identification: identified by name, DOB, and current address.    Subjective:   Sabrina Ramirez is a 78 y.o. female who presents for Medicare Annual (Subsequent) preventive examination.  Review of Systems:   Cardiac Risk Factors include: advanced age (>38men, >80 women);dyslipidemia;hypertension     Objective:     Vitals: There were no vitals taken for this visit.  There is no height or weight on file to calculate BMI.  Advanced Directives 03/11/2019 01/26/2019 02/01/2018 10/28/2017 08/06/2017 10/11/2016  Does Patient Have a Medical Advance Directive? Yes No Yes Yes Yes Yes  Type of Advance Directive Living will;Healthcare Power of [place];Living will [place];Living will [place];Living will Living will;Healthcare Power of Attorney  Does patient want to make changes to medical advance directive? No - Patient declined - No - Patient declined - No - Patient declined -  Copy of [place] in Chart? No - copy requested - No - copy requested No - copy requested No - copy requested -  Would patient like information on creating a medical advance directive? - No - Patient declined No - Patient declined - - -    Tobacco Social History   Tobacco Use  Smoking Status Never Smoker  Smokeless Tobacco Never Used     Counseling given: Not Answered   Clinical Intake:  Pre-visit preparation completed: Yes  Diabetes: No  How often do you need to have someone help you when you read instructions, pamphlets, or other written materials from your doctor or pharmacy?: 1 - Never  Interpreter Needed?: No  Information entered by :: Denman George LPN  Past Medical History:  Diagnosis Date  . Allergic asthma, mild intermittent, uncomplicated 0000000   Dr. Donneta Romberg, allergist  . Arthritis   . Chronic kidney disease    Stage III kidney disease  . GERD (gastroesophageal reflux disease)   . Hemorrhoids   . History of kidney stones   . Hypertension   . IBS (irritable bowel syndrome)   . IFG (impaired fasting glucose) 03/09/2019   a1c 6.3 02/2019  . Psoriasis    Past Surgical History:  Procedure Laterality Date  . ABDOMINAL HYSTERECTOMY  1987  . CATARACT EXTRACTION W/ INTRAOCULAR LENS  IMPLANT, BILATERAL Bilateral 2015  . CHOLECYSTECTOMY    . HEMORRHOID SURGERY    . SPINE SURGERY  07/10/2017  . TONSILLECTOMY     Family History  Problem Relation Age of Onset  . Stroke Mother   . Diabetes Father   . Heart attack Father   . Heart disease Father   . Hyperlipidemia Father   . Hypertension Father   . Stroke Father   . Diabetes Sister   . Hypertension Sister   . Transient ischemic attack Sister   . COPD Maternal Grandmother   . Heart disease Maternal Grandmother   . Deep vein thrombosis Maternal Grandfather   . Stroke Maternal Grandfather   . Deep vein thrombosis Paternal Grandmother   . Hypertension Paternal Grandmother   . Hypertension Daughter   . Arthritis Maternal Aunt   . COPD Maternal Aunt   . Hypertension Maternal Aunt   . Diabetes Paternal Uncle   .  Heart disease Paternal Uncle   . Stroke Paternal Uncle   . Breast cancer Neg Hx    Social History   Socioeconomic History  . Marital status: Divorced    Spouse name: Not on file  . Number of children: Not on file  . Years of education: Not on file  . Highest education level: Not on file  Occupational History  . Not on file  Social Needs  . Financial resource strain: Not on file  . Food insecurity    Worry: Not on file    Inability: Not on file  . Transportation needs    Medical: Not on file    Non-medical: Not on file  Tobacco Use  . Smoking  status: Never Smoker  . Smokeless tobacco: Never Used  Substance and Sexual Activity  . Alcohol use: Yes    Comment: occassionally  . Drug use: No  . Sexual activity: Never  Lifestyle  . Physical activity    Days per week: Not on file    Minutes per session: Not on file  . Stress: Not on file  Relationships  . Social Herbalist on phone: Not on file    Gets together: Not on file    Attends religious service: Not on file    Active member of club or organization: Not on file    Attends meetings of clubs or organizations: Not on file    Relationship status: Not on file  Other Topics Concern  . Not on file  Social History Narrative  . Not on file    Outpatient Encounter Medications as of 03/11/2019  Medication Sig  . Albuterol Sulfate (PROAIR RESPICLICK) 123XX123 (90 Base) MCG/ACT AEPB Inhale 1-2 puffs into the lungs every 6 (six) hours as needed (for wheezing/shortness of breath).   Marland Kitchen aspirin EC 81 MG tablet Take 1 tablet (81 mg total) by mouth at bedtime.  Marland Kitchen azelastine (ASTELIN) 0.1 % nasal spray Place 1 spray into the nose daily as needed (for allergies.).   Marland Kitchen cetirizine (ZYRTEC) 10 MG tablet Take 10 mg by mouth daily.  . Cholecalciferol (VITAMIN D3) 2000 units TABS Take 2,000 Units by mouth daily.  Marland Kitchen CINNAMON PO Take [id number] applicators by mouth daily. 1/4 teaspoon in coffee grounds  . diclofenac sodium (VOLTAREN) 1 % GEL Apply 2 g topically 4 (four) times daily.  . fluocinonide cream (LIDEX) 0.05 % APPLY SPARINGLY  TO AFFECTED AREA THREE TIMES DAILY for psoriasis flares. Do not use longer than 2 weeks.  . fluticasone furoate-vilanterol (BREO ELLIPTA) 200-25 MCG/INH AEPB Inhale 1 puff into the lungs daily.  . Glucosamine-Chondroitin (COSAMIN DS PO) Take 2 tablets by mouth daily.  . Guaifenesin 1200 MG TB12 Take 1 tablet by mouth as needed.  . metoprolol succinate (TOPROL-XL) 100 MG 24 hr tablet Take 1 tablet by mouth once daily  . montelukast (SINGULAIR) 10 MG tablet Take 10  mg by mouth at bedtime.   . Multiple Vitamin (MULTIVITAMIN) tablet Take 1 tablet by mouth daily. Senior Multivitamin  . omeprazole (PRILOSEC) 20 MG capsule Take 20 mg by mouth daily.  . rosuvastatin (CRESTOR) 5 MG tablet TAKE 1/2 (ONE-HALF) TABLET BY MOUTH AT BEDTIME  . triamcinolone cream (KENALOG) 0.1 % Apply 1 application topically 2 (two) times daily as needed (for skin rash (Summertime)).   . Turmeric POWD Take by mouth.   No facility-administered encounter medications on file as of 03/11/2019.     Activities of Daily Living In your present state  of health, do you have any difficulty performing the following activities: 03/11/2019  Hearing? N  Vision? N  Difficulty concentrating or making decisions? N  Walking or climbing stairs? Y  Comment due to impaired mobility post lumbar surgery  Dressing or bathing? N  Doing errands, shopping? N  Preparing Food and eating ? N  Using the Toilet? N  In the past six months, have you accidently leaked urine? N  Do you have problems with loss of bowel control? N  Managing your Medications? N  Managing your Finances? N  Housekeeping or managing your Housekeeping? N  Some recent data might be hidden    Patient Care Team: Leamon Arnt, MD as PCP - General (Family Medicine) Mosetta Anis, MD as Referring Physician (Allergy) Consuella Lose, MD as Consulting Physician (Neurosurgery) Mottinger, Sharyn Lull (Dentistry)    Assessment:   This is a routine wellness examination for [place].  Exercise Activities and Dietary recommendations Current Exercise Habits: The patient does not participate in regular exercise at present(actively in physical therapy)  Goals    . Patient Stated     Maintain current health by increasing activity.        Fall Risk Fall Risk  03/11/2019 12/29/2018 10/19/2018 08/11/2018 10/28/2017  Falls in the past year? 0 0 0 0 No  Number falls in past yr: 0 0 0 0 -  Injury with Fall? 0 0 0 0 -  Risk for fall due to :  Impaired mobility - - - -  Follow up Education provided;Falls evaluation completed;Falls prevention discussed Falls evaluation completed Falls evaluation completed Falls evaluation completed -   Is the patient's home free of loose throw rugs in walkways, pet beds, electrical cords, etc?   yes      Grab bars in the bathroom? yes      Handrails on the stairs?   yes      Adequate lighting?   yes  Depression Screen PHQ 2/9 Scores 03/08/2019 10/19/2018 10/28/2017  PHQ - 2 Score 2 2 0  PHQ- 9 Score 6 4 -     Cognitive Function: no cognitive concerns at this time    Alert? Yes         Normal Appearance? N/a  Oriented to person? Yes           Place? Yes  Time? Yes  Recall of three objects? Yes  Can perform simple calculations? Yes  Displays appropriate judgment? Yes  Can read the correct time from a watch face? Yes    Immunization History  Administered Date(s) Administered  . Fluad Quad(high Dose 65+) 03/08/2019  . Influenza, High Dose Seasonal PF 03/02/2012, 04/06/2013, 04/21/2014, 04/16/2015, 03/06/2016, 04/09/2017, 03/01/2018  . Influenza-Unspecified 04/12/2011  . Pneumococcal Conjugate-13 10/05/2014  . Pneumococcal Polysaccharide-23 04/23/2010  . Pneumococcal-Unspecified 04/23/2010  . Tdap 05/23/2010  . Zoster 06/09/2004  . Zoster Recombinat (Shingrix) 11/18/2017, 03/23/2018    Qualifies for Shingles Vaccine? Shingrix completed   Screening Tests Health Maintenance  Topic Date Due  . URINE MICROALBUMIN  05/09/1951  . TETANUS/TDAP  05/23/2020  . DEXA SCAN  04/28/2023  . INFLUENZA VACCINE  Completed  . PNA vac Low Risk Adult  Completed    Cancer Screenings: Lung: Low Dose CT Chest recommended if Age 6-80 years, 30 pack-year currently smoking OR have quit w/in 15years. Patient does not qualify. Breast:  Up to date on Mammogram? Yes; ordered for 2020 Up to date of Bone Density/Dexa? Yes Colorectal: Not indicated      Plan:  I have personally reviewed and addressed the  Medicare Annual Wellness questionnaire and have noted the following in the patient's chart:  A. Medical and social history B. Use of alcohol, tobacco or illicit drugs  C. Current medications and supplements D. Functional ability and status E.  Nutritional status F.  Physical activity G. Advance directives H. List of other physicians I.  Hospitalizations, surgeries, and ER visits in previous 12 months J.  [place] such as hearing and vision if needed, cognitive and depression L. Referrals, records requested, and appointments- none   In addition, I have reviewed and discussed with patient certain preventive protocols, quality metrics, and best practice recommendations. A written personalized care plan for preventive services as well as general preventive health recommendations were provided to patient.   Signed,  Denman George, LPN  Nurse Health Advisor   Nurse Notes: no additional

## 2019-03-12 NOTE — Progress Notes (Signed)
I have reviewed the documentation from the recent AWV done by Courtney Slade, RN; I agree with the documentation and will follow up on any recommendations or abnormal findings as suggested.  

## 2019-03-16 ENCOUNTER — Ambulatory Visit: Payer: Medicare PPO | Admitting: Physical Therapy

## 2019-03-16 ENCOUNTER — Encounter: Payer: Self-pay | Admitting: Physical Therapy

## 2019-03-16 ENCOUNTER — Other Ambulatory Visit: Payer: Self-pay

## 2019-03-16 DIAGNOSIS — M25512 Pain in left shoulder: Secondary | ICD-10-CM

## 2019-03-16 DIAGNOSIS — R262 Difficulty in walking, not elsewhere classified: Secondary | ICD-10-CM | POA: Diagnosis not present

## 2019-03-16 DIAGNOSIS — M545 Low back pain, unspecified: Secondary | ICD-10-CM

## 2019-03-16 DIAGNOSIS — M6281 Muscle weakness (generalized): Secondary | ICD-10-CM | POA: Diagnosis not present

## 2019-03-16 DIAGNOSIS — M25612 Stiffness of left shoulder, not elsewhere classified: Secondary | ICD-10-CM

## 2019-03-16 DIAGNOSIS — G8929 Other chronic pain: Secondary | ICD-10-CM | POA: Diagnosis not present

## 2019-03-16 DIAGNOSIS — M62830 Muscle spasm of back: Secondary | ICD-10-CM | POA: Diagnosis not present

## 2019-03-16 DIAGNOSIS — M2569 Stiffness of other specified joint, not elsewhere classified: Secondary | ICD-10-CM | POA: Diagnosis not present

## 2019-03-16 NOTE — Therapy (Signed)
[place], [name], [address] Phone: [phone]   Fax:  [phone]  Physical Therapy Evaluation  Patient Details  Name: Sabrina Ramirez MRN: [id number] Date of Birth: 12-Jul-1940 Referring Provider (PT): Billey Chang   Encounter Date: 03/16/2019  PT End of Session - 03/16/19 1346    Visit Number  10    Authorization Type  [employer]    PT Start Time  [id number]    PT Stop Time  1407    PT Time Calculation (min)  53 min    Activity Tolerance  Patient tolerated treatment well    Behavior During Therapy  [facility] for tasks assessed/performed       Past Medical History:  Diagnosis Date  . Allergic asthma, mild intermittent, uncomplicated 0000000   Dr. Donneta Romberg, allergist  . Arthritis   . Chronic kidney disease    Stage III kidney disease  . GERD (gastroesophageal reflux disease)   . Hemorrhoids   . History of kidney stones   . Hypertension   . IBS (irritable bowel syndrome)   . IFG (impaired fasting glucose) 03/09/2019   a1c 6.3 02/2019  . Psoriasis     Past Surgical History:  Procedure Laterality Date  . ABDOMINAL HYSTERECTOMY  1987  . CATARACT EXTRACTION W/ INTRAOCULAR LENS  IMPLANT, BILATERAL Bilateral 2015  . CHOLECYSTECTOMY    . HEMORRHOID SURGERY    . SPINE SURGERY  07/10/2017  . TONSILLECTOMY      There were no vitals filed for this visit.   Subjective Assessment - 03/16/19 1320    Subjective  Patient comes in with a new order for left shoulder pain.  She reports that she has had some pain for about 10 years.  She reports that it got worse recently.  X-rays are negative, reports that she started waking up at night with pain over the past month.  Cortisone injection has helped but still hsa pain.  She reports diffidulty doing hair and getting dressed    Pertinent History  had a fusion 3 level early 2019    Limitations  House hold activities    Patient Stated Goals  less pain, more  motions, less pain for hair and dressing    Currently in Pain?  Yes    Pain Score  1     Pain Location  Shoulder    Pain Orientation  Left;Anterior    Pain Descriptors / Indicators  Aching;Sharp;Nagging;Tightness;Sore    Pain Type  Chronic pain    Pain Radiating Towards  denies    Pain Onset  More than a month ago    Pain Frequency  Intermittent    Aggravating Factors   reaching, lifting, dressing , pain up to 8/10    Pain Relieving Factors  not doing anything pain can be 0/10    Effect of Pain on Daily Activities  difficulty sleeping, doing hair and dressing         [facility] PT Assessment - 03/16/19 0001      Assessment   Medical Diagnosis  left shoulder pain    Referring Provider (PT)  Billey Chang    Onset Date/Surgical Date  02/14/19    Prior Therapy  currently being seen for the LBP      Precautions   Precautions  None      Balance Screen   Has the patient fallen in the past 6 months  No    Has the patient had  a decrease in activity level because of a fear of falling?   No    Is the patient reluctant to leave their home because of a fear of falling?   No      Home Environment   Additional Comments  does housework, yardwork, some stairs      Prior Function   Level of Independence  Independent    Vocation  Retired    Leisure  likes to garden      AROM   Left Shoulder Flexion  120 Degrees    Left Shoulder ABduction  105 Degrees    Left Shoulder Internal Rotation  50 Degrees    Left Shoulder External Rotation  60 Degrees      Strength   Strength Assessment Site  Shoulder    Right/Left Shoulder  Left    Left Shoulder Flexion  4-/5   pain   Left Shoulder ABduction  4-/5   pain   Left Shoulder Internal Rotation  4/5   mild pain   Left Shoulder External Rotation  4/5      Palpation   Palpation comment  she has tightness and spasms in the left upper trap and rhomboid area, she is very tender in the left biceps origin      Special Tests   Other special tests  +  impingement and bcieps tendon tests                Objective measurements completed on examination: See above findings.      [facility] Adult PT Treatment/Exercise - 03/16/19 0001      Moist Heat Therapy   Number Minutes Moist Heat  15 Minutes    Moist Heat Location  Shoulder;Lumbar Spine      Electrical Stimulation   Electrical Stimulation Location  lumbar and left shoulder    Electrical Stimulation Action  premod    Electrical Stimulation Parameters  supine    Electrical Stimulation Goals  Pain             PT Education - 03/16/19 1345    Education Details  gave HEP for AAROM and PROM of the left shoulder to gain ROM    Person(s) Educated  Patient    Methods  Explanation;Demonstration;Handout    Comprehension  Verbalized understanding       PT Short Term Goals - 02/03/19 1651      PT SHORT TERM GOAL #1   Title  Patient independent with HEP    Status  Achieved        PT Long Term Goals - 03/16/19 1349      Additional Long Term Goals   Additional Long Term Goals  Yes      PT LONG TERM GOAL #6   Title  sleep through the night without shoulder pain waking her up    Time  8    Period  Weeks    Status  New      PT LONG TERM GOAL #7   Title  increase AROM of the left shoulder to 150 degrees flexion    Time  8    Period  Weeks    Status  New             Plan - 03/16/19 1346    Clinical Impression Statement  Patient has been seen her for the LBP and we initiated some left shoulder treatment, she went to [facility] and had left shoulder x-rays, they were negative.  She had a  cortisone injection that has helped some, she reports that lately the pain has awoken her at night and she is having increased difficulty dressing and doing her hair.  She had positive impingment test and positive biceps tests, Her AROM is about what her PROM causing concern for a frozen shoulder.    Personal Factors and Comorbidities  Comorbidity 3+    Comorbidities  GERD, IBS, lumbar  surgery    Stability/Clinical Decision Making  Stable/Uncomplicated    Rehab Potential  Good    PT Frequency  2x / week    PT Duration  8 weeks    PT Treatment/Interventions  ADLs/Self Care Home Management;Cryotherapy;Electrical Stimulation;Iontophoresis 4mg /ml Dexamethasone;Moist Heat;Ultrasound;Therapeutic activities;Therapeutic exercise;Neuromuscular re-education;Patient/family education;Dry needling;Manual techniques    PT Next Visit Plan  will work on the shoulder ROM and scapular stability, we will continue with LBP treatment    Consulted and Agree with Plan of Care  Patient       Patient will benefit from skilled therapeutic intervention in order to improve the following deficits and impairments:  Pain, Improper body mechanics, Postural dysfunction, Increased muscle spasms, Decreased activity tolerance, Decreased range of motion, Decreased strength, Difficulty walking, Impaired flexibility, Impaired UE functional use  Visit Diagnosis: Chronic left shoulder pain - Plan: PT plan of care cert/re-cert  Stiffness of left shoulder, not elsewhere classified - Plan: PT plan of care cert/re-cert  Muscle spasm of back - Plan: PT plan of care cert/re-cert  Acute right-sided low back pain without sciatica - Plan: PT plan of care cert/re-cert     Problem List Patient Active Problem List   Diagnosis Date Noted  . IFG (impaired fasting glucose) 03/09/2019  . Localized primary osteoarthritis of carpometacarpal joint of left thumb 03/08/2019  . On statin therapy due to risk of future cardiovascular event 12/01/2017  . Spondylolisthesis at L5-S1 level 08/13/2017  . Colon polyp 05/25/2017  . Ophthalmic migraine 05/25/2017  . Essential tremor 05/25/2017  . Allergic asthma, mild intermittent, uncomplicated 0000000  . Obesity, Class I, BMI 30-34.9 04/16/2015  . Conjunctivitis, allergic, chronic 10/01/2012  . Hypertriglyceridemia 09/22/2011  . Rosacea 06/25/2011  . Irritable bowel syndrome  with diarrhea 07/08/2010  . Benign essential hypertension 05/23/2010  . CKD (chronic kidney disease) stage 3, GFR 30-59 ml/min 05/23/2010  . Chronic allergic rhinitis 02/25/2010  . GERD (gastroesophageal reflux disease) 02/25/2010  . Psoriasis 02/25/2010  . Osteoarthritis, multiple sites 06/12/2009    Sumner Boast. PT 03/16/2019, 1:57 PM  [place], [name], [address] Phone: [phone]   Fax:  [phone]  Name: Sabrina Ramirez MRN: [id number] Date of Birth: 01-May-1941

## 2019-03-17 ENCOUNTER — Ambulatory Visit: Payer: Medicare PPO | Admitting: Physical Therapy

## 2019-03-17 ENCOUNTER — Encounter: Payer: Self-pay | Admitting: Physical Therapy

## 2019-03-17 DIAGNOSIS — M545 Low back pain: Secondary | ICD-10-CM | POA: Diagnosis not present

## 2019-03-17 DIAGNOSIS — M25512 Pain in left shoulder: Secondary | ICD-10-CM | POA: Diagnosis not present

## 2019-03-17 DIAGNOSIS — M6281 Muscle weakness (generalized): Secondary | ICD-10-CM | POA: Diagnosis not present

## 2019-03-17 DIAGNOSIS — R262 Difficulty in walking, not elsewhere classified: Secondary | ICD-10-CM | POA: Diagnosis not present

## 2019-03-17 DIAGNOSIS — M62830 Muscle spasm of back: Secondary | ICD-10-CM

## 2019-03-17 DIAGNOSIS — M25612 Stiffness of left shoulder, not elsewhere classified: Secondary | ICD-10-CM | POA: Diagnosis not present

## 2019-03-17 DIAGNOSIS — G8929 Other chronic pain: Secondary | ICD-10-CM | POA: Diagnosis not present

## 2019-03-17 DIAGNOSIS — M2569 Stiffness of other specified joint, not elsewhere classified: Secondary | ICD-10-CM | POA: Diagnosis not present

## 2019-03-17 NOTE — Therapy (Signed)
[place] [address] [place], [name], [address] Phone: [phone]   Fax:  [phone]  Physical Therapy Treatment  Patient Details  Name: Sabrina Ramirez MRN: [id number] Date of Birth: Mar 14, 1941 Referring Provider (PT): Billey Chang   Encounter Date: 03/17/2019  PT End of Session - 03/17/19 1430    Visit Number  11    Date for PT Re-Evaluation  03/28/19    Authorization Type  [employer]    PT Start Time  1350    PT Stop Time  1445    PT Time Calculation (min)  55 min    Activity Tolerance  Patient tolerated treatment well    Behavior During Therapy  [facility] [place] for tasks assessed/performed       Past Medical History:  Diagnosis Date  . Allergic asthma, mild intermittent, uncomplicated 16/38/4665   Dr. Donneta Romberg, allergist  . Arthritis   . Chronic kidney disease    Stage III kidney disease  . GERD (gastroesophageal reflux disease)   . Hemorrhoids   . History of kidney stones   . Hypertension   . IBS (irritable bowel syndrome)   . IFG (impaired fasting glucose) 03/09/2019   a1c 6.3 02/2019  . Psoriasis     Past Surgical History:  Procedure Laterality Date  . ABDOMINAL HYSTERECTOMY  1987  . CATARACT EXTRACTION W/ INTRAOCULAR LENS  IMPLANT, BILATERAL Bilateral 2015  . CHOLECYSTECTOMY    . HEMORRHOID SURGERY    . SPINE SURGERY  07/10/2017  . TONSILLECTOMY      There were no vitals filed for this visit.  Subjective Assessment - 03/17/19 1352    Subjective  L shoulder is really sore from the HEP.    Currently in Pain?  No/denies                       [facility] Adult PT Treatment/Exercise - 03/17/19 0001      Exercises   Exercises  Shoulder      Lumbar Exercises: Aerobic   UBE (Upper Arm Bike)  L2 x 3 min     Nustep  L4 x 5 min      Lumbar Exercises: Standing   Row  Theraband;Both;Strengthening;15 reps   x2   Theraband Level (Row)  Level 3 (Green)    Shoulder Extension   Both;Strengthening;15 reps   x2   Theraband Level (Shoulder Extension)  Level 3 (Green)    Other Standing Lumbar Exercises  Hip flex, ext & abd 3lb x10, no weight x10      Lumbar Exercises: Supine   Bridge  Compliant;10 reps;3 seconds      Shoulder Exercises: Supine   Flexion  AAROM;Both;10 reps   cane     Shoulder Exercises: Standing   External Rotation  Left;20 reps;Theraband    Theraband Level (Shoulder External Rotation)  Level 1 (Yellow)    Internal Rotation  Left;20 reps;Theraband    Theraband Level (Shoulder Internal Rotation)  Level 1 (Yellow)    Flexion  Strengthening;Both;20 reps;Weights    Shoulder Flexion Weight (lbs)  1    ABduction  Strengthening;Both;20 reps;Weights    Shoulder ABduction Weight (lbs)  1      Moist Heat Therapy   Number Minutes Moist Heat  15 Minutes    Moist Heat Location  Shoulder;Lumbar Spine      Electrical Stimulation   Electrical Stimulation Location  lumbar and left shoulder    Electrical Stimulation Action  premod  Electrical Stimulation Parameters  supine    Electrical Stimulation Goals  Pain      Manual Therapy   Manual Therapy  Soft tissue mobilization;Passive ROM;Joint mobilization    Manual therapy comments  Some guarding    Soft tissue mobilization  L deltoid    Passive ROM  L shoulder all directions               PT Short Term Goals - 02/03/19 1651      PT SHORT TERM GOAL #1   Title  Patient independent with HEP    Status  Achieved        PT Long Term Goals - 03/17/19 1439      PT LONG TERM GOAL #1   Title  Patient able to walk to grocery shop without pain >4/10    Status  Partially Met      PT LONG TERM GOAL #2   Title  stand to cook meals wihtout sitting to rest    Status  Partially Met      PT LONG TERM GOAL #3   Title  Patient able to ascend/descend stairs at church with minimal pain.    Status  On-going            Plan - 03/17/19 1431    Clinical Impression Statement  Added some  shoulder specific interventions today. She did really well with L shoulder external and internal rotation. Tactile cues needed for posture with standing hip exercises. Overall L shoulder PROM is good, some guarding, requiring cues to relax. Pt doe report some pain at end range of PROM.    Personal Factors and Comorbidities  Comorbidity 3+    Comorbidities  GERD, IBS, lumbar surgery    Stability/Clinical Decision Making  Stable/Uncomplicated    Rehab Potential  Good    PT Frequency  2x / week    PT Duration  8 weeks    PT Treatment/Interventions  ADLs/Self Care Home Management;Cryotherapy;Electrical Stimulation;Iontophoresis 64m/ml Dexamethasone;Moist Heat;Ultrasound;Therapeutic activities;Therapeutic exercise;Neuromuscular re-education;Patient/family education;Dry needling;Manual techniques    PT Next Visit Plan  will work on the shoulder ROM and scapular stability, we will continue with LBP treatment       Patient will benefit from skilled therapeutic intervention in order to improve the following deficits and impairments:  Pain, Improper body mechanics, Postural dysfunction, Increased muscle spasms, Decreased activity tolerance, Decreased range of motion, Decreased strength, Difficulty walking, Impaired flexibility, Impaired UE functional use  Visit Diagnosis: Chronic left shoulder pain  Muscle spasm of back  Stiffness of left shoulder, not elsewhere classified     Problem List Patient Active Problem List   Diagnosis Date Noted  . IFG (impaired fasting glucose) 03/09/2019  . Localized primary osteoarthritis of carpometacarpal joint of left thumb 03/08/2019  . On statin therapy due to risk of future cardiovascular event 12/01/2017  . Spondylolisthesis at L5-S1 level 08/13/2017  . Colon polyp 05/25/2017  . Ophthalmic migraine 05/25/2017  . Essential tremor 05/25/2017  . Allergic asthma, mild intermittent, uncomplicated 163/81/7711 . Obesity, Class I, BMI 30-34.9 04/16/2015  .  Conjunctivitis, allergic, chronic 10/01/2012  . Hypertriglyceridemia 09/22/2011  . Rosacea 06/25/2011  . Irritable bowel syndrome with diarrhea 07/08/2010  . Benign essential hypertension 05/23/2010  . CKD (chronic kidney disease) stage 3, GFR 30-59 ml/min 05/23/2010  . Chronic allergic rhinitis 02/25/2010  . GERD (gastroesophageal reflux disease) 02/25/2010  . Psoriasis 02/25/2010  . Osteoarthritis, multiple sites 06/12/2009    RScot Jun PTA 03/17/2019, 2:40 PM  [place], [name], [address] Phone: [phone]   Fax:  [phone]  Name: Sabrina Ramirez MRN: [id number] Date of Birth: Jul 03, 1940

## 2019-03-21 ENCOUNTER — Ambulatory Visit: Payer: Medicare PPO | Admitting: Physical Therapy

## 2019-03-21 ENCOUNTER — Other Ambulatory Visit: Payer: Self-pay

## 2019-03-21 DIAGNOSIS — M545 Low back pain: Secondary | ICD-10-CM | POA: Diagnosis not present

## 2019-03-21 DIAGNOSIS — M62830 Muscle spasm of back: Secondary | ICD-10-CM | POA: Diagnosis not present

## 2019-03-21 DIAGNOSIS — M25612 Stiffness of left shoulder, not elsewhere classified: Secondary | ICD-10-CM | POA: Diagnosis not present

## 2019-03-21 DIAGNOSIS — M2569 Stiffness of other specified joint, not elsewhere classified: Secondary | ICD-10-CM | POA: Diagnosis not present

## 2019-03-21 DIAGNOSIS — G8929 Other chronic pain: Secondary | ICD-10-CM | POA: Diagnosis not present

## 2019-03-21 DIAGNOSIS — R262 Difficulty in walking, not elsewhere classified: Secondary | ICD-10-CM | POA: Diagnosis not present

## 2019-03-21 DIAGNOSIS — M25512 Pain in left shoulder: Secondary | ICD-10-CM | POA: Diagnosis not present

## 2019-03-21 DIAGNOSIS — M6281 Muscle weakness (generalized): Secondary | ICD-10-CM | POA: Diagnosis not present

## 2019-03-21 NOTE — Therapy (Signed)
[address] [place], [name], [address] Phone: [phone]   Fax:  [phone]  Physical Therapy Treatment  Patient Details  Name: Sabrina Ramirez MRN: [id number] Date of Birth: 03/26/1941 Referring Provider (PT): Billey Chang   Encounter Date: 03/21/2019  PT End of Session - 03/21/19 1020    Visit Number  12    PT Start Time  0932    PT Stop Time  0479    PT Time Calculation (min)  55 min       Past Medical History:  Diagnosis Date  . Allergic asthma, mild intermittent, uncomplicated 98/72/1587   Dr. Donneta Romberg, allergist  . Arthritis   . Chronic kidney disease    Stage III kidney disease  . GERD (gastroesophageal reflux disease)   . Hemorrhoids   . History of kidney stones   . Hypertension   . IBS (irritable bowel syndrome)   . IFG (impaired fasting glucose) 03/09/2019   a1c 6.3 02/2019  . Psoriasis     Past Surgical History:  Procedure Laterality Date  . ABDOMINAL HYSTERECTOMY  1987  . CATARACT EXTRACTION W/ INTRAOCULAR LENS  IMPLANT, BILATERAL Bilateral 2015  . CHOLECYSTECTOMY    . HEMORRHOID SURGERY    . SPINE SURGERY  07/10/2017  . TONSILLECTOMY      There were no vitals filed for this visit.  Subjective Assessment - 03/21/19 0935    Subjective  pt states she is feeling stiff this morning. rates her pain as a 3/10 in back, L shoulder, and neck.    Currently in Pain?  Yes    Pain Score  3     Pain Location  Neck    Pain Orientation  Left                       [facility] Adult PT Treatment/Exercise - 03/21/19 0001      Lumbar Exercises: Aerobic   UBE (Upper Arm Bike)  L2 x 3 min     Nustep  L4 x 5 min      Lumbar Exercises: Standing   Row  Theraband;Both;Strengthening;15 reps    Theraband Level (Row)  Level 3 (Green)    Shoulder Extension  Both;Strengthening;15 reps    Theraband Level (Shoulder Extension)  Level 3 (Green)      Shoulder Exercises: Supine    Flexion  AAROM;Both;10 reps      Shoulder Exercises: Standing   External Rotation  Left;20 reps;Theraband    Theraband Level (Shoulder External Rotation)  Level 1 (Yellow)    Internal Rotation  Left;20 reps;Theraband    Theraband Level (Shoulder Internal Rotation)  Level 1 (Yellow)    Flexion  Strengthening;Both;20 reps;Weights    Shoulder Flexion Weight (lbs)  1    ABduction  Strengthening;Both;20 reps;Weights    Shoulder ABduction Weight (lbs)  1      Moist Heat Therapy   Number Minutes Moist Heat  15 Minutes    Moist Heat Location  Shoulder;Lumbar Spine      Electrical Stimulation   Electrical Stimulation Location  lumbar and left shoulder    Electrical Stimulation Action  premod    Electrical Stimulation Parameters  supine    Electrical Stimulation Goals  Pain      Manual Therapy   Manual Therapy  Passive ROM    Manual therapy comments  some gaurding    Passive ROM  L shoulder all directions  PT Short Term Goals - 02/03/19 1651      PT SHORT TERM GOAL #1   Title  Patient independent with HEP    Status  Achieved        PT Long Term Goals - 03/17/19 1439      PT LONG TERM GOAL #1   Title  Patient able to walk to grocery shop without pain >4/10    Status  Partially Met      PT LONG TERM GOAL #2   Title  stand to cook meals wihtout sitting to rest    Status  Partially Met      PT LONG TERM GOAL #3   Title  Patient able to ascend/descend stairs at church with minimal pain.    Status  On-going            Plan - 03/21/19 1015    Clinical Impression Statement  pt requires rest break due to back pain. PROM is good with some guarding. pt reports some pain at the end range of ER PROM. Pt complains of pain after completing shoulder exercises. Cues needed for direction of IR and ER exercises.       Patient will benefit from skilled therapeutic intervention in order to improve the following deficits and impairments:     Visit  Diagnosis: Back stiffness  Chronic left shoulder pain     Problem List Patient Active Problem List   Diagnosis Date Noted  . IFG (impaired fasting glucose) 03/09/2019  . Localized primary osteoarthritis of carpometacarpal joint of left thumb 03/08/2019  . On statin therapy due to risk of future cardiovascular event 12/01/2017  . Spondylolisthesis at L5-S1 level 08/13/2017  . Colon polyp 05/25/2017  . Ophthalmic migraine 05/25/2017  . Essential tremor 05/25/2017  . Allergic asthma, mild intermittent, uncomplicated 31/28/1188  . Obesity, Class I, BMI 30-34.9 04/16/2015  . Conjunctivitis, allergic, chronic 10/01/2012  . Hypertriglyceridemia 09/22/2011  . Rosacea 06/25/2011  . Irritable bowel syndrome with diarrhea 07/08/2010  . Benign essential hypertension 05/23/2010  . CKD (chronic kidney disease) stage 3, GFR 30-59 ml/min 05/23/2010  . Chronic allergic rhinitis 02/25/2010  . GERD (gastroesophageal reflux disease) 02/25/2010  . Psoriasis 02/25/2010  . Osteoarthritis, multiple sites 06/12/2009    Barrett Henle, [name] 03/21/2019, 10:30 AM  [place], [name], [address] Phone: [phone]   Fax:  [phone]  Name: Voncille Simm Snader MRN: [id number] Date of Birth: Oct 28, 1940

## 2019-03-24 ENCOUNTER — Other Ambulatory Visit: Payer: Self-pay

## 2019-03-24 ENCOUNTER — Ambulatory Visit: Payer: Medicare PPO | Admitting: Physical Therapy

## 2019-03-24 DIAGNOSIS — M25512 Pain in left shoulder: Secondary | ICD-10-CM

## 2019-03-24 DIAGNOSIS — M25612 Stiffness of left shoulder, not elsewhere classified: Secondary | ICD-10-CM | POA: Diagnosis not present

## 2019-03-24 DIAGNOSIS — G8929 Other chronic pain: Secondary | ICD-10-CM

## 2019-03-24 DIAGNOSIS — M545 Low back pain: Secondary | ICD-10-CM | POA: Diagnosis not present

## 2019-03-24 DIAGNOSIS — R262 Difficulty in walking, not elsewhere classified: Secondary | ICD-10-CM | POA: Diagnosis not present

## 2019-03-24 DIAGNOSIS — M62830 Muscle spasm of back: Secondary | ICD-10-CM | POA: Diagnosis not present

## 2019-03-24 DIAGNOSIS — M2569 Stiffness of other specified joint, not elsewhere classified: Secondary | ICD-10-CM | POA: Diagnosis not present

## 2019-03-24 DIAGNOSIS — M6281 Muscle weakness (generalized): Secondary | ICD-10-CM | POA: Diagnosis not present

## 2019-03-24 NOTE — Therapy (Signed)
[facility] [place], [name], [address] Phone: [phone]   Fax:  [phone]  Physical Therapy Treatment  Patient Details  Name: Sabrina Ramirez MRN: [id number] Date of Birth: April 06, 1941 Referring Provider (PT): Billey Chang   Encounter Date: 03/24/2019  PT End of Session - 03/24/19 1147    Visit Number  13    PT Start Time  1100    PT Stop Time  7681    PT Time Calculation (min)  55 min       Past Medical History:  Diagnosis Date  . Allergic asthma, mild intermittent, uncomplicated 15/72/6203   Dr. Donneta Romberg, allergist  . Arthritis   . Chronic kidney disease    Stage III kidney disease  . GERD (gastroesophageal reflux disease)   . Hemorrhoids   . History of kidney stones   . Hypertension   . IBS (irritable bowel syndrome)   . IFG (impaired fasting glucose) 03/09/2019   a1c 6.3 02/2019  . Psoriasis     Past Surgical History:  Procedure Laterality Date  . ABDOMINAL HYSTERECTOMY  1987  . CATARACT EXTRACTION W/ INTRAOCULAR LENS  IMPLANT, BILATERAL Bilateral 2015  . CHOLECYSTECTOMY    . HEMORRHOID SURGERY    . SPINE SURGERY  07/10/2017  . TONSILLECTOMY      There were no vitals filed for this visit.  Subjective Assessment - 03/24/19 1101    Subjective  "felt more loose after last treatment". "pain in back and L shoulder"    Currently in Pain?  Yes    Pain Score  2     Pain Location  Shoulder    Pain Orientation  Left                       [facility] Adult PT Treatment/Exercise - 03/24/19 0001      Lumbar Exercises: Aerobic   UBE (Upper Arm Bike)  L2 x 4 min     Nustep  L4 x 5 min      Shoulder Exercises: Seated   Other Seated Exercises  seated rows, flys, and shoulder ext with 2# weights      Shoulder Exercises: Standing   External Rotation  Left;20 reps;Theraband    Theraband Level (Shoulder External Rotation)  Level 1 (Yellow)    Internal Rotation  Left;20  reps;Theraband    Theraband Level (Shoulder Internal Rotation)  Level 1 (Yellow)    Flexion  Strengthening;Both;20 reps;Weights    Shoulder Flexion Weight (lbs)  1    ABduction  Strengthening;Both;20 reps;Weights    Shoulder ABduction Weight (lbs)  1      Moist Heat Therapy   Number Minutes Moist Heat  15 Minutes    Moist Heat Location  Shoulder;Lumbar Spine      Electrical Stimulation   Electrical Stimulation Location  lumbar and left shoulder    Electrical Stimulation Action  premod    Electrical Stimulation Parameters  supine    Electrical Stimulation Goals  Pain      Manual Therapy   Manual Therapy  Passive ROM    Passive ROM  L shoulder all directions               PT Short Term Goals - 02/03/19 1651      PT SHORT TERM GOAL #1   Title  Patient independent with HEP    Status  Achieved        PT Long Term  Goals - 03/24/19 1149      PT LONG TERM GOAL #1   Title  Patient able to walk to grocery shop without pain >4/10    Status  Partially Met      PT LONG TERM GOAL #2   Title  stand to cook meals wihtout sitting to rest    Status  Partially Met      PT LONG TERM GOAL #3   Title  Patient able to ascend/descend stairs at church with minimal pain.    Status  On-going            Plan - 03/24/19 1144    Clinical Impression Statement  Pt neededs rest breaks due to back pain after prolonged standing. Pt reports some pain during shoulder abduction with weights. PROM is good with a little guarding during shoulder flexion.    Rehab Potential  Good    PT Frequency  2x / week    PT Treatment/Interventions  ADLs/Self Care Home Management;Cryotherapy;Electrical Stimulation;Iontophoresis 11m/ml Dexamethasone;Moist Heat;Ultrasound;Therapeutic activities;Therapeutic exercise;Neuromuscular re-education;Patient/family education;Dry needling;Manual techniques       Patient will benefit from skilled therapeutic intervention in order to improve the following deficits  and impairments:  Pain, Improper body mechanics, Postural dysfunction, Increased muscle spasms, Decreased activity tolerance, Decreased range of motion, Decreased strength, Difficulty walking, Impaired flexibility, Impaired UE functional use  Visit Diagnosis: Chronic left shoulder pain  Stiffness of left shoulder, not elsewhere classified     Problem List Patient Active Problem List   Diagnosis Date Noted  . IFG (impaired fasting glucose) 03/09/2019  . Localized primary osteoarthritis of carpometacarpal joint of left thumb 03/08/2019  . On statin therapy due to risk of future cardiovascular event 12/01/2017  . Spondylolisthesis at L5-S1 level 08/13/2017  . Colon polyp 05/25/2017  . Ophthalmic migraine 05/25/2017  . Essential tremor 05/25/2017  . Allergic asthma, mild intermittent, uncomplicated 139/43/2003 . Obesity, Class I, BMI 30-34.9 04/16/2015  . Conjunctivitis, allergic, chronic 10/01/2012  . Hypertriglyceridemia 09/22/2011  . Rosacea 06/25/2011  . Irritable bowel syndrome with diarrhea 07/08/2010  . Benign essential hypertension 05/23/2010  . CKD (chronic kidney disease) stage 3, GFR 30-59 ml/min 05/23/2010  . Chronic allergic rhinitis 02/25/2010  . GERD (gastroesophageal reflux disease) 02/25/2010  . Psoriasis 02/25/2010  . Osteoarthritis, multiple sites 06/12/2009    SBarrett Henle SAlaska10/15/2020, 11:51 AM  [place] [address]Phone: [phone]  Fax:  [phone] Name: Sabrina Ramirez MRN: [id number] of Birth: [birth year]

## 2019-03-28 ENCOUNTER — Other Ambulatory Visit: Payer: Self-pay

## 2019-03-28 ENCOUNTER — Ambulatory Visit: Payer: Medicare PPO | Admitting: Physical Therapy

## 2019-03-28 DIAGNOSIS — M545 Low back pain: Secondary | ICD-10-CM | POA: Diagnosis not present

## 2019-03-28 DIAGNOSIS — M25512 Pain in left shoulder: Secondary | ICD-10-CM

## 2019-03-28 DIAGNOSIS — M25612 Stiffness of left shoulder, not elsewhere classified: Secondary | ICD-10-CM | POA: Diagnosis not present

## 2019-03-28 DIAGNOSIS — R262 Difficulty in walking, not elsewhere classified: Secondary | ICD-10-CM

## 2019-03-28 DIAGNOSIS — G8929 Other chronic pain: Secondary | ICD-10-CM | POA: Diagnosis not present

## 2019-03-28 DIAGNOSIS — M62830 Muscle spasm of back: Secondary | ICD-10-CM | POA: Diagnosis not present

## 2019-03-28 DIAGNOSIS — M6281 Muscle weakness (generalized): Secondary | ICD-10-CM | POA: Diagnosis not present

## 2019-03-28 DIAGNOSIS — M2569 Stiffness of other specified joint, not elsewhere classified: Secondary | ICD-10-CM | POA: Diagnosis not present

## 2019-03-28 NOTE — Therapy (Signed)
[place], [name], [address] Phone: [phone]   Fax:  [phone]  Physical Therapy Treatment  Patient Details  Name: Sabrina Ramirez Anthis MRN: [id number] Date of Birth: 12-09-1940 Referring Provider (PT): Billey Chang   Encounter Date: 03/28/2019  PT End of Session - 03/28/19 1154    Visit Number  14    PT Start Time  2409    PT Stop Time  1200    PT Time Calculation (min)  58 min       Past Medical History:  Diagnosis Date  . Allergic asthma, mild intermittent, uncomplicated 73/53/2992   Dr. Donneta Romberg, allergist  . Arthritis   . Chronic kidney disease    Stage III kidney disease  . GERD (gastroesophageal reflux disease)   . Hemorrhoids   . History of kidney stones   . Hypertension   . IBS (irritable bowel syndrome)   . IFG (impaired fasting glucose) 03/09/2019   a1c 6.3 02/2019  . Psoriasis     Past Surgical History:  Procedure Laterality Date  . ABDOMINAL HYSTERECTOMY  1987  . CATARACT EXTRACTION W/ INTRAOCULAR LENS  IMPLANT, BILATERAL Bilateral 2015  . CHOLECYSTECTOMY    . HEMORRHOID SURGERY    . SPINE SURGERY  07/10/2017  . TONSILLECTOMY      There were no vitals filed for this visit.  Subjective Assessment - 03/28/19 1103    Subjective  "a little pain" "some soreness after last treatment"    Currently in Pain?  Yes    Pain Score  2     Pain Location  Shoulder    Pain Orientation  Left                       [facility] Adult PT Treatment/Exercise - 03/28/19 0001      Ambulation/Gait   Ambulation/Gait  Yes    Ambulation/Gait Assistance  7: Independent    Ambulation Distance (Feet)  150 Feet    Gait Pattern  Step-through pattern      Lumbar Exercises: Aerobic   UBE (Upper Arm Bike)  L2 x 4 min     Nustep  L4 x 5 min      Lumbar Exercises: Standing   Other Standing Lumbar Exercises  step up and over, 4" box, 2 x 10       Shoulder Exercises: Seated    Other Seated Exercises  seated rows, flys, and shoulder ext with 2# weights    Other Seated Exercises  AAROM shoulder flexion with cane 2x10      Shoulder Exercises: Standing   Flexion  Strengthening;Both;20 reps;Weights    Shoulder Flexion Weight (lbs)  1    Row  Strengthening;20 reps;Theraband    Theraband Level (Shoulder Row)  Level 2 (Red)    Other Standing Exercises  shoulder rolls w/ 1# 2 x 10      Moist Heat Therapy   Number Minutes Moist Heat  15 Minutes    Moist Heat Location  Shoulder;Lumbar Spine      Electrical Stimulation   Electrical Stimulation Location  lumbar and left shoulder    Electrical Stimulation Action  premod    Electrical Stimulation Parameters  supine    Electrical Stimulation Goals  Pain      Manual Therapy   Manual Therapy  Passive ROM    Passive ROM  L shoulder all directions  PT Short Term Goals - 02/03/19 1651      PT SHORT TERM GOAL #1   Title  Patient independent with HEP    Status  Achieved        PT Long Term Goals - 03/24/19 1149      PT LONG TERM GOAL #1   Title  Patient able to walk to grocery shop without pain >4/10    Status  Partially Met      PT LONG TERM GOAL #2   Title  stand to cook meals wihtout sitting to rest    Status  Partially Met      PT LONG TERM GOAL #3   Title  Patient able to ascend/descend stairs at church with minimal pain.    Status  On-going            Plan - 03/28/19 1146    Clinical Impression Statement  Pt continues to gaurd with PROM. Continual cues to relax, especially for shoulder abduction. Pt able to walk down the hall with mininal back pain. Pt progressed to step up and over to work toward goal of using stairs without pain.    Rehab Potential  Good    PT Frequency  2x / week    PT Treatment/Interventions  ADLs/Self Care Home Management;Cryotherapy;Electrical Stimulation;Iontophoresis 32m/ml Dexamethasone;Moist Heat;Ultrasound;Therapeutic activities;Therapeutic  exercise;Neuromuscular re-education;Patient/family education;Dry needling;Manual techniques    PT Next Visit Plan  will work on the shoulder ROM and scapular stability, we will continue with LBP treatment       Patient will benefit from skilled therapeutic intervention in order to improve the following deficits and impairments:  Pain, Improper body mechanics, Postural dysfunction, Increased muscle spasms, Decreased activity tolerance, Decreased range of motion, Decreased strength, Difficulty walking, Impaired flexibility, Impaired UE functional use  Visit Diagnosis: Chronic left shoulder pain  Difficulty in walking, not elsewhere classified     Problem List Patient Active Problem List   Diagnosis Date Noted  . IFG (impaired fasting glucose) 03/09/2019  . Localized primary osteoarthritis of carpometacarpal joint of left thumb 03/08/2019  . On statin therapy due to risk of future cardiovascular event 12/01/2017  . Spondylolisthesis at L5-S1 level 08/13/2017  . Colon polyp 05/25/2017  . Ophthalmic migraine 05/25/2017  . Essential tremor 05/25/2017  . Allergic asthma, mild intermittent, uncomplicated 158/00/6349 . Obesity, Class I, BMI 30-34.9 04/16/2015  . Conjunctivitis, allergic, chronic 10/01/2012  . Hypertriglyceridemia 09/22/2011  . Rosacea 06/25/2011  . Irritable bowel syndrome with diarrhea 07/08/2010  . Benign essential hypertension 05/23/2010  . CKD (chronic kidney disease) stage 3, GFR 30-59 ml/min 05/23/2010  . Chronic allergic rhinitis 02/25/2010  . GERD (gastroesophageal reflux disease) 02/25/2010  . Psoriasis 02/25/2010  . Osteoarthritis, multiple sites 06/12/2009    SBarrett Henle SAlaska10/19/2020, 12:05 PM  [place] [address]Phone: [phone]  Fax:  [phone] Name: [name] MRN: [id number] of Birth: [birth year]

## 2019-03-31 ENCOUNTER — Other Ambulatory Visit: Payer: Self-pay

## 2019-03-31 ENCOUNTER — Encounter: Payer: Self-pay | Admitting: Physical Therapy

## 2019-03-31 ENCOUNTER — Ambulatory Visit: Payer: Medicare PPO | Admitting: Physical Therapy

## 2019-03-31 DIAGNOSIS — R262 Difficulty in walking, not elsewhere classified: Secondary | ICD-10-CM | POA: Diagnosis not present

## 2019-03-31 DIAGNOSIS — M545 Low back pain: Secondary | ICD-10-CM | POA: Diagnosis not present

## 2019-03-31 DIAGNOSIS — M2569 Stiffness of other specified joint, not elsewhere classified: Secondary | ICD-10-CM | POA: Diagnosis not present

## 2019-03-31 DIAGNOSIS — M25612 Stiffness of left shoulder, not elsewhere classified: Secondary | ICD-10-CM | POA: Diagnosis not present

## 2019-03-31 DIAGNOSIS — G8929 Other chronic pain: Secondary | ICD-10-CM | POA: Diagnosis not present

## 2019-03-31 DIAGNOSIS — M6281 Muscle weakness (generalized): Secondary | ICD-10-CM | POA: Diagnosis not present

## 2019-03-31 DIAGNOSIS — M62830 Muscle spasm of back: Secondary | ICD-10-CM | POA: Diagnosis not present

## 2019-03-31 DIAGNOSIS — M25512 Pain in left shoulder: Secondary | ICD-10-CM | POA: Diagnosis not present

## 2019-03-31 NOTE — Therapy (Signed)
[facility] [address] [place], [name], [address] Phone: [phone]   Fax:  [phone]  Physical Therapy Treatment  Patient Details  Name: Sabrina Ramirez MRN: [id number] Date of Birth: 07-31-1940 Referring Provider (PT): Billey Chang   Encounter Date: 03/31/2019  PT End of Session - 03/31/19 1141    Visit Number  15    Date for PT Re-Evaluation  03/28/19    Authorization Type  [employer]    PT Start Time  1100    PT Stop Time  1154    PT Time Calculation (min)  54 min    Activity Tolerance  Patient tolerated treatment well    Behavior During Therapy  [facility] for tasks assessed/performed       Past Medical History:  Diagnosis Date  . Allergic asthma, mild intermittent, uncomplicated 18/29/9371   Dr. Donneta Romberg, allergist  . Arthritis   . Chronic kidney disease    Stage III kidney disease  . GERD (gastroesophageal reflux disease)   . Hemorrhoids   . History of kidney stones   . Hypertension   . IBS (irritable bowel syndrome)   . IFG (impaired fasting glucose) 03/09/2019   a1c 6.3 02/2019  . Psoriasis     Past Surgical History:  Procedure Laterality Date  . ABDOMINAL HYSTERECTOMY  1987  . CATARACT EXTRACTION W/ INTRAOCULAR LENS  IMPLANT, BILATERAL Bilateral 2015  . CHOLECYSTECTOMY    . HEMORRHOID SURGERY    . SPINE SURGERY  07/10/2017  . TONSILLECTOMY      There were no vitals filed for this visit.  Subjective Assessment - 03/31/19 1104    Subjective  "Going better, I can do things at home I wasn't able to do in years with this L arm "    Currently in Pain?  No/denies         [facility] PT Assessment - 03/31/19 0001      AROM   Left Shoulder Flexion  130 Degrees    Left Shoulder ABduction  115 Degrees    Left Shoulder Internal Rotation  63 Degrees    Left Shoulder External Rotation  68 Degrees                   [facility] Adult PT Treatment/Exercise - 03/31/19 0001      Ambulation/Gait   Ambulation/Gait  Yes    Ambulation/Gait Assistance  7: Independent    Ambulation Distance (Feet)  175 Feet    Gait Pattern  Step-through pattern    Ambulation Surface  Level;Indoor    Stairs  Yes    Stairs Assistance  6: Modified independent (Device/Increase time)    Stair Management Technique  One rail Left;One rail Right;Alternating pattern;Step to pattern    Number of Stairs  12    Height of Stairs  6    Gait Comments  Cues for step to pattern      Lumbar Exercises: Aerobic   UBE (Upper Arm Bike)  L1 3 min each way      Nustep  L4 x  71mn      Shoulder Exercises: Standing   External Rotation  Left;20 reps;Theraband    Theraband Level (Shoulder External Rotation)  Level 1 (Yellow)    Flexion  Strengthening;Both;20 reps;Weights    Shoulder Flexion Weight (lbs)  2    ABduction  Strengthening;Both;20 reps;Weights    Shoulder ABduction Weight (lbs)  1      Moist Heat Therapy  Number Minutes Moist Heat  15 Minutes    Moist Heat Location  Shoulder;Lumbar Spine      Electrical Stimulation   Electrical Stimulation Location  lumbar and left shoulder    Electrical Stimulation Action  premod    Electrical Stimulation Parameters  supine    Electrical Stimulation Goals  Pain      Manual Therapy   Manual Therapy  Passive ROM    Passive ROM  L shoulder all directions               PT Short Term Goals - 02/03/19 1651      PT SHORT TERM GOAL #1   Title  Patient independent with HEP    Status  Achieved        PT Long Term Goals - 03/31/19 1106      PT LONG TERM GOAL #1   Title  Patient able to walk to grocery shop without pain >4/10    Status  Partially Met      PT LONG TERM GOAL #2   Title  stand to cook meals wihtout sitting to rest    Status  Partially Met      PT LONG TERM GOAL #3   Title  Patient able to ascend/descend stairs at church with minimal pain.            Plan - 03/31/19 1143    Clinical Impression Statement  Pt has progressed increasing  he L shoulder AROM. She does reports improved L shoulder utilization at home with ADLS. Good job maintaining good posture with stir negotiation and gait trial, however she did  have some bilateral lateral thigh pain due to fatigue. Some tolerable pain at end range of L shoulder PROM.    Comorbidities  GERD, IBS, lumbar surgery    Stability/Clinical Decision Making  Stable/Uncomplicated    Rehab Potential  Good    PT Frequency  2x / week    PT Duration  8 weeks    PT Treatment/Interventions  ADLs/Self Care Home Management;Cryotherapy;Electrical Stimulation;Iontophoresis 73m/ml Dexamethasone;Moist Heat;Ultrasound;Therapeutic activities;Therapeutic exercise;Neuromuscular re-education;Patient/family education;Dry needling;Manual techniques    PT Next Visit Plan  will work on the shoulder ROM and scapular stability, we will continue with LBP treatment       Patient will benefit from skilled therapeutic intervention in order to improve the following deficits and impairments:  Pain, Improper body mechanics, Postural dysfunction, Increased muscle spasms, Decreased activity tolerance, Decreased range of motion, Decreased strength, Difficulty walking, Impaired flexibility, Impaired UE functional use  Visit Diagnosis: Chronic left shoulder pain  Stiffness of left shoulder, not elsewhere classified  Back stiffness  Difficulty in walking, not elsewhere classified     Problem List Patient Active Problem List   Diagnosis Date Noted  . IFG (impaired fasting glucose) 03/09/2019  . Localized primary osteoarthritis of carpometacarpal joint of left thumb 03/08/2019  . On statin therapy due to risk of future cardiovascular event 12/01/2017  . Spondylolisthesis at L5-S1 level 08/13/2017  . Colon polyp 05/25/2017  . Ophthalmic migraine 05/25/2017  . Essential tremor 05/25/2017  . Allergic asthma, mild intermittent, uncomplicated 139/08/90 . Obesity, Class I, BMI 30-34.9 04/16/2015  . Conjunctivitis,  allergic, chronic 10/01/2012  . Hypertriglyceridemia 09/22/2011  . Rosacea 06/25/2011  . Irritable bowel syndrome with diarrhea 07/08/2010  . Benign essential hypertension 05/23/2010  . CKD (chronic kidney disease) stage 3, GFR 30-59 ml/min 05/23/2010  . Chronic allergic rhinitis 02/25/2010  . GERD (gastroesophageal reflux disease) 02/25/2010  . Psoriasis  02/25/2010  . Osteoarthritis, multiple sites 06/12/2009    Scot Jun, PTA 03/31/2019, 11:45 AM  [place], [name], [address] Phone: [phone]   Fax:  [phone]  Name: Maitri Schnoebelen Vitug MRN: [id number] Date of Birth: 03-Aug-1940

## 2019-04-04 ENCOUNTER — Ambulatory Visit: Payer: Medicare PPO | Admitting: Physical Therapy

## 2019-04-04 ENCOUNTER — Other Ambulatory Visit: Payer: Self-pay

## 2019-04-04 DIAGNOSIS — G8929 Other chronic pain: Secondary | ICD-10-CM

## 2019-04-04 DIAGNOSIS — R262 Difficulty in walking, not elsewhere classified: Secondary | ICD-10-CM | POA: Diagnosis not present

## 2019-04-04 DIAGNOSIS — M6281 Muscle weakness (generalized): Secondary | ICD-10-CM | POA: Diagnosis not present

## 2019-04-04 DIAGNOSIS — M25612 Stiffness of left shoulder, not elsewhere classified: Secondary | ICD-10-CM | POA: Diagnosis not present

## 2019-04-04 DIAGNOSIS — M25512 Pain in left shoulder: Secondary | ICD-10-CM | POA: Diagnosis not present

## 2019-04-04 DIAGNOSIS — M62830 Muscle spasm of back: Secondary | ICD-10-CM | POA: Diagnosis not present

## 2019-04-04 DIAGNOSIS — M545 Low back pain: Secondary | ICD-10-CM | POA: Diagnosis not present

## 2019-04-04 DIAGNOSIS — M2569 Stiffness of other specified joint, not elsewhere classified: Secondary | ICD-10-CM | POA: Diagnosis not present

## 2019-04-04 NOTE — Therapy (Signed)
[place] [address] [place], [name], [address] Phone: [phone]   Fax:  [phone]  Physical Therapy Treatment  Patient Details  Name: Sabrina Ramirez MRN: [id number] Date of Birth: Oct 24, 1940 Referring Provider (PT): Sabrina Ramirez   Encounter Date: 04/04/2019  PT End of Session - 04/04/19 1515    Visit Number  16    PT Start Time  9390    PT Stop Time  3009    PT Time Calculation (min)  55 min       Past Medical History:  Diagnosis Date  . Allergic asthma, mild intermittent, uncomplicated 23/30/0762   Dr. Donneta Ramirez, allergist  . Arthritis   . Chronic kidney disease    Stage III kidney disease  . GERD (gastroesophageal reflux disease)   . Hemorrhoids   . History of kidney stones   . Hypertension   . IBS (irritable bowel syndrome)   . IFG (impaired fasting glucose) 03/09/2019   a1c 6.3 02/2019  . Psoriasis     Past Surgical History:  Procedure Laterality Date  . ABDOMINAL HYSTERECTOMY  1987  . CATARACT EXTRACTION W/ INTRAOCULAR LENS  IMPLANT, BILATERAL Bilateral 2015  . CHOLECYSTECTOMY    . HEMORRHOID SURGERY    . SPINE SURGERY  07/10/2017  . TONSILLECTOMY      There were no vitals filed for this visit.  Subjective Assessment - 04/04/19 1437    Subjective  "doing really good". "Arm pain has not woken me up in several nights, once or twice in the past week".    Currently in Pain?  Yes    Pain Score  1     Pain Location  Shoulder                       [facility] Adult PT Treatment/Exercise - 04/04/19 0001      Ambulation/Gait   Gait Comments  1.5 flights of stairs       Lumbar Exercises: Aerobic   UBE (Upper Arm Bike)  L1 3 min each way      Nustep  L4 x  50mn      Shoulder Exercises: Seated   Other Seated Exercises  OHP and chest press w/ red band 2 x 10      Shoulder Exercises: Standing   External Rotation  Left;20 reps;Theraband    Theraband Level (Shoulder External  Rotation)  Level 1 (Yellow)    Other Standing Exercises  IR with yellow tband 2 x 10      Moist Heat Therapy   Number Minutes Moist Heat  15 Minutes    Moist Heat Location  Shoulder;Lumbar Spine      Electrical Stimulation   Electrical Stimulation Location  lumbar and left shoulder    Electrical Stimulation Action  premod    Electrical Stimulation Parameters  supine    Electrical Stimulation Goals  Pain      Manual Therapy   Manual Therapy  Passive ROM    Passive ROM  L shoulder all directions               PT Short Term Goals - 02/03/19 1651      PT SHORT TERM GOAL #1   Title  Patient independent with HEP    Status  Achieved        PT Long Term Goals - 03/31/19 1106      PT LONG TERM GOAL #1   Title  Patient able to walk to grocery shop without pain >4/10    Status  Partially Met      PT LONG TERM GOAL #2   Title  stand to cook meals wihtout sitting to rest    Status  Partially Met      PT LONG TERM GOAL #3   Title  Patient able to ascend/descend stairs at church with minimal pain.            Plan - 04/04/19 1518    Clinical Impression Statement  pt went up and down the stairs two times. She to rest in between due to SOB. pt still guards with PROM. She complains of pain with shoulder abduction.    Comorbidities  GERD, IBS, lumbar surgery    Rehab Potential  Good    PT Frequency  2x / week    PT Duration  8 weeks    PT Treatment/Interventions  ADLs/Self Care Home Management;Cryotherapy;Electrical Stimulation;Iontophoresis 44m/ml Dexamethasone;Moist Heat;Ultrasound;Therapeutic activities;Therapeutic exercise;Neuromuscular re-education;Patient/family education;Dry needling;Manual techniques    PT Next Visit Plan  will work on the shoulder ROM and scapular stability, we will continue with LBP treatment       Patient will benefit from skilled therapeutic intervention in order to improve the following deficits and impairments:  Pain, Improper body  mechanics, Postural dysfunction, Increased muscle spasms, Decreased activity tolerance, Decreased range of motion, Decreased strength, Difficulty walking, Impaired flexibility, Impaired UE functional use  Visit Diagnosis: Chronic left shoulder pain  Stiffness of left shoulder, not elsewhere classified     Problem List Patient Active Problem List   Diagnosis Date Noted  . IFG (impaired fasting glucose) 03/09/2019  . Localized primary osteoarthritis of carpometacarpal joint of left thumb 03/08/2019  . On statin therapy due to risk of future cardiovascular event 12/01/2017  . Spondylolisthesis at L5-S1 level 08/13/2017  . Colon polyp 05/25/2017  . Ophthalmic migraine 05/25/2017  . Essential tremor 05/25/2017  . Allergic asthma, mild intermittent, uncomplicated 198/07/2177 . Obesity, Class I, BMI 30-34.9 04/16/2015  . Conjunctivitis, allergic, chronic 10/01/2012  . Hypertriglyceridemia 09/22/2011  . Rosacea 06/25/2011  . Irritable bowel syndrome with diarrhea 07/08/2010  . Benign essential hypertension 05/23/2010  . CKD (chronic kidney disease) stage 3, GFR 30-59 ml/min 05/23/2010  . Chronic allergic rhinitis 02/25/2010  . GERD (gastroesophageal reflux disease) 02/25/2010  . Psoriasis 02/25/2010  . Osteoarthritis, multiple sites 06/12/2009    SBarrett Ramirez SAlaska10/26/2020, 3:23 PM  [place] [address]Phone: [phone]  Fax:  [phone] Name: Sabrina Ramirez MRN: [id number] of Birth: 111-03-1941

## 2019-04-07 ENCOUNTER — Ambulatory Visit: Payer: Medicare PPO | Admitting: Physical Therapy

## 2019-04-11 ENCOUNTER — Ambulatory Visit: Payer: Medicare PPO | Attending: Family Medicine | Admitting: Physical Therapy

## 2019-04-11 ENCOUNTER — Other Ambulatory Visit: Payer: Self-pay

## 2019-04-11 DIAGNOSIS — G8929 Other chronic pain: Secondary | ICD-10-CM | POA: Diagnosis not present

## 2019-04-11 DIAGNOSIS — R262 Difficulty in walking, not elsewhere classified: Secondary | ICD-10-CM | POA: Insufficient documentation

## 2019-04-11 DIAGNOSIS — M545 Low back pain: Secondary | ICD-10-CM | POA: Diagnosis not present

## 2019-04-11 DIAGNOSIS — M6281 Muscle weakness (generalized): Secondary | ICD-10-CM | POA: Insufficient documentation

## 2019-04-11 DIAGNOSIS — M25512 Pain in left shoulder: Secondary | ICD-10-CM | POA: Insufficient documentation

## 2019-04-11 DIAGNOSIS — M2569 Stiffness of other specified joint, not elsewhere classified: Secondary | ICD-10-CM | POA: Diagnosis not present

## 2019-04-11 DIAGNOSIS — M25612 Stiffness of left shoulder, not elsewhere classified: Secondary | ICD-10-CM

## 2019-04-11 DIAGNOSIS — M62830 Muscle spasm of back: Secondary | ICD-10-CM | POA: Diagnosis not present

## 2019-04-11 NOTE — Therapy (Signed)
[address] [place], [name], [address] Phone: [phone]   Fax:  [phone]  Physical Therapy Treatment  Patient Details  Name: Sabrina Ramirez MRN: [id number] Date of Birth: 07-11-1940 Referring Provider (PT): Billey Chang   Encounter Date: 04/11/2019  PT End of Session - 04/11/19 1143    Visit Number  71    PT Start Time  1108    PT Stop Time  4827    PT Time Calculation (min)  47 min       Past Medical History:  Diagnosis Date  . Allergic asthma, mild intermittent, uncomplicated 07/86/7544   Dr. Donneta Romberg, allergist  . Arthritis   . Chronic kidney disease    Stage III kidney disease  . GERD (gastroesophageal reflux disease)   . Hemorrhoids   . History of kidney stones   . Hypertension   . IBS (irritable bowel syndrome)   . IFG (impaired fasting glucose) 03/09/2019   a1c 6.3 02/2019  . Psoriasis     Past Surgical History:  Procedure Laterality Date  . ABDOMINAL HYSTERECTOMY  1987  . CATARACT EXTRACTION W/ INTRAOCULAR LENS  IMPLANT, BILATERAL Bilateral 2015  . CHOLECYSTECTOMY    . HEMORRHOID SURGERY    . SPINE SURGERY  07/10/2017  . TONSILLECTOMY      There were no vitals filed for this visit.  Subjective Assessment - 04/11/19 1109    Subjective  "no pain, doing pretty good"    Currently in Pain?  No/denies                       [facility] Adult PT Treatment/Exercise - 04/11/19 0001      Ambulation/Gait   Gait Comments  1.5 flights of stairs       Lumbar Exercises: Aerobic   Nustep  L4 x  48mn      Shoulder Exercises: Standing   External Rotation  Left;20 reps;Theraband    Theraband Level (Shoulder External Rotation)  Level 2 (Red)    Flexion  Strengthening;Both;20 reps;Weights    Shoulder Flexion Weight (lbs)  1    ABduction  Strengthening;Both;20 reps;Weights    Shoulder ABduction Weight (lbs)  1    Other Standing Exercises  IR with red tband 2 x 10      Moist Heat Therapy   Number Minutes Moist Heat  15 Minutes    Moist Heat Location  Shoulder;Lumbar Spine      Electrical Stimulation   Electrical Stimulation Location  lumbar and left shoulder    Electrical Stimulation Action  premod    Electrical Stimulation Parameters  supine    Electrical Stimulation Goals  Pain      Manual Therapy   Manual Therapy  Passive ROM    Passive ROM  L shoulder in flexion and abduction               PT Short Term Goals - 02/03/19 1651      PT SHORT TERM GOAL #1   Title  Patient independent with HEP    Status  Achieved        PT Long Term Goals - 03/31/19 1106      PT LONG TERM GOAL #1   Title  Patient able to walk to grocery shop without pain >4/10    Status  Partially Met      PT LONG TERM GOAL #2   Title  stand to cook meals wihtout  sitting to rest    Status  Partially Met      PT LONG TERM GOAL #3   Title  Patient able to ascend/descend stairs at church with minimal pain.            Plan - 04/11/19 1145    Clinical Impression Statement  pt arrived 8 minutes late. pt needs rest break after walking up and down stairs due to SOB. Pt able to reduce guarding with PROM. pt has pain when moving shoulder into abduction.    Personal Factors and Comorbidities  Comorbidity 3+    Comorbidities  GERD, IBS, lumbar surgery    Stability/Clinical Decision Making  Stable/Uncomplicated    Rehab Potential  Good    PT Frequency  2x / week    PT Duration  8 weeks    PT Treatment/Interventions  ADLs/Self Care Home Management;Cryotherapy;Electrical Stimulation;Iontophoresis 48m/ml Dexamethasone;Moist Heat;Ultrasound;Therapeutic activities;Therapeutic exercise;Neuromuscular re-education;Patient/family education;Dry needling;Manual techniques    PT Next Visit Plan  work on shoulder ROM and increasing endurance       Patient will benefit from skilled therapeutic intervention in order to improve the following deficits and impairments:  Pain,  Improper body mechanics, Postural dysfunction, Increased muscle spasms, Decreased activity tolerance, Decreased range of motion, Decreased strength, Difficulty walking, Impaired flexibility, Impaired UE functional use  Visit Diagnosis: Stiffness of left shoulder, not elsewhere classified     Problem List Patient Active Problem List   Diagnosis Date Noted  . IFG (impaired fasting glucose) 03/09/2019  . Localized primary osteoarthritis of carpometacarpal joint of left thumb 03/08/2019  . On statin therapy due to risk of future cardiovascular event 12/01/2017  . Spondylolisthesis at L5-S1 level 08/13/2017  . Colon polyp 05/25/2017  . Ophthalmic migraine 05/25/2017  . Essential tremor 05/25/2017  . Allergic asthma, mild intermittent, uncomplicated 182/42/3536 . Obesity, Class I, BMI 30-34.9 04/16/2015  . Conjunctivitis, allergic, chronic 10/01/2012  . Hypertriglyceridemia 09/22/2011  . Rosacea 06/25/2011  . Irritable bowel syndrome with diarrhea 07/08/2010  . Benign essential hypertension 05/23/2010  . CKD (chronic kidney disease) stage 3, GFR 30-59 ml/min 05/23/2010  . Chronic allergic rhinitis 02/25/2010  . GERD (gastroesophageal reflux disease) 02/25/2010  . Psoriasis 02/25/2010  . Osteoarthritis, multiple sites 06/12/2009    SBarrett Henle SAlaska11/07/2018, 11:49 AM  [place] [address]Phone: [phone]  Fax:  [phone] Name: Sabrina Ramirez MRN: [id number] of Birth: [birth year]

## 2019-04-14 ENCOUNTER — Other Ambulatory Visit: Payer: Self-pay

## 2019-04-14 ENCOUNTER — Ambulatory Visit: Payer: Medicare PPO | Admitting: Physical Therapy

## 2019-04-14 ENCOUNTER — Encounter: Payer: Self-pay | Admitting: Physical Therapy

## 2019-04-14 DIAGNOSIS — M2569 Stiffness of other specified joint, not elsewhere classified: Secondary | ICD-10-CM

## 2019-04-14 DIAGNOSIS — G8929 Other chronic pain: Secondary | ICD-10-CM | POA: Diagnosis not present

## 2019-04-14 DIAGNOSIS — M25512 Pain in left shoulder: Secondary | ICD-10-CM | POA: Diagnosis not present

## 2019-04-14 DIAGNOSIS — M6281 Muscle weakness (generalized): Secondary | ICD-10-CM | POA: Diagnosis not present

## 2019-04-14 DIAGNOSIS — R262 Difficulty in walking, not elsewhere classified: Secondary | ICD-10-CM | POA: Diagnosis not present

## 2019-04-14 DIAGNOSIS — M62830 Muscle spasm of back: Secondary | ICD-10-CM | POA: Diagnosis not present

## 2019-04-14 DIAGNOSIS — M25612 Stiffness of left shoulder, not elsewhere classified: Secondary | ICD-10-CM

## 2019-04-14 DIAGNOSIS — M545 Low back pain: Secondary | ICD-10-CM | POA: Diagnosis not present

## 2019-04-14 NOTE — Therapy (Signed)
[address] [place], [name], [address] Phone: [phone]   Fax:  [phone]  Physical Therapy Treatment  Patient Details  Name: Sabrina Ramirez MRN: [id number] Date of Birth: 1940/10/26 Referring Provider (PT): Billey Chang   Encounter Date: 04/14/2019  PT End of Session - 04/14/19 1143    Visit Number  18    Authorization Type  [employer]    PT Start Time  1100    PT Stop Time  1143    PT Time Calculation (min)  43 min    Activity Tolerance  Patient tolerated treatment well    Behavior During Therapy  [facility] for tasks assessed/performed       Past Medical History:  Diagnosis Date  . Allergic asthma, mild intermittent, uncomplicated 27/11/2374   Dr. Donneta Romberg, allergist  . Arthritis   . Chronic kidney disease    Stage III kidney disease  . GERD (gastroesophageal reflux disease)   . Hemorrhoids   . History of kidney stones   . Hypertension   . IBS (irritable bowel syndrome)   . IFG (impaired fasting glucose) 03/09/2019   a1c 6.3 02/2019  . Psoriasis     Past Surgical History:  Procedure Laterality Date  . ABDOMINAL HYSTERECTOMY  1987  . CATARACT EXTRACTION W/ INTRAOCULAR LENS  IMPLANT, BILATERAL Bilateral 2015  . CHOLECYSTECTOMY    . HEMORRHOID SURGERY    . SPINE SURGERY  07/10/2017  . TONSILLECTOMY      There were no vitals filed for this visit.  Subjective Assessment - 04/14/19 1105    Subjective  "a little stiff today, I over worked it Tuesday standing cooking" "Arm is getting better and better"    Pertinent History  had a fusion 3 level early 2019    Currently in Pain?  Yes    Pain Score  3     Pain Location  Back    Pain Descriptors / Indicators  Aching         [facility] PT Assessment - 04/14/19 0001      AROM   Left Shoulder Flexion  146 Degrees    Left Shoulder ABduction  119 Degrees    Left Shoulder Internal Rotation  69 Degrees    Left Shoulder External Rotation  65 Degrees                    [facility] Adult PT Treatment/Exercise - 04/14/19 0001      Ambulation/Gait   Gait Comments  .5 flights of stairs then ambulated down long hall and back   very fatigued     Lumbar Exercises: Machines for Strengthening   Cybex Knee Extension  5lb 2x10     Cybex Knee Flexion  20lb 2x10      Shoulder Exercises: Standing   External Rotation  Left;20 reps;Theraband    Theraband Level (Shoulder External Rotation)  Level 2 (Red)    Extension  Weights;Both;20 reps;Strengthening    Extension Weight (lbs)  5      Shoulder Exercises: ROM/Strengthening   UBE (Upper Arm Bike)  L3 2.5 each wat    Other ROM/Strengthening Exercises  Rows and Lats 20lb 2x10       Manual Therapy   Manual Therapy  Passive ROM    Joint Mobilization  Grades 1&2    Passive ROM  L shoulder all directions.               PT Short Term  Goals - 02/03/19 1651      PT SHORT TERM GOAL #1   Title  Patient independent with HEP    Status  Achieved        PT Long Term Goals - 03/31/19 1106      PT LONG TERM GOAL #1   Title  Patient able to walk to grocery shop without pain >4/10    Status  Partially Met      PT LONG TERM GOAL #2   Title  stand to cook meals wihtout sitting to rest    Status  Partially Met      PT LONG TERM GOAL #3   Title  Patient able to ascend/descend stairs at church with minimal pain.            Plan - 04/14/19 1143    Clinical Impression Statement  Pt enters clinic reporting some increase achy in her low back due to standing and cooking tiesday. This pain did increase with stair negotiation and walking. She continues to become really fatigue with functional activities. She reports overall improvement with the usage of her L arm. Some guarding with PROM but it was good once patient relaxed.    Comorbidities  GERD, IBS, lumbar surgery    Stability/Clinical Decision Making  Stable/Uncomplicated    Rehab Potential  Good    PT Frequency  2x / week    PT  Duration  8 weeks    PT Treatment/Interventions  ADLs/Self Care Home Management;Cryotherapy;Electrical Stimulation;Iontophoresis 22m/ml Dexamethasone;Moist Heat;Ultrasound;Therapeutic activities;Therapeutic exercise;Neuromuscular re-education;Patient/family education;Dry needling;Manual techniques    PT Next Visit Plan  work on shoulder ROM and increasing functional endurance       Patient will benefit from skilled therapeutic intervention in order to improve the following deficits and impairments:  Pain, Improper body mechanics, Postural dysfunction, Increased muscle spasms, Decreased activity tolerance, Decreased range of motion, Decreased strength, Difficulty walking, Impaired flexibility, Impaired UE functional use  Visit Diagnosis: Stiffness of left shoulder, not elsewhere classified  Chronic left shoulder pain  Back stiffness     Problem List Patient Active Problem List   Diagnosis Date Noted  . IFG (impaired fasting glucose) 03/09/2019  . Localized primary osteoarthritis of carpometacarpal joint of left thumb 03/08/2019  . On statin therapy due to risk of future cardiovascular event 12/01/2017  . Spondylolisthesis at L5-S1 level 08/13/2017  . Colon polyp 05/25/2017  . Ophthalmic migraine 05/25/2017  . Essential tremor 05/25/2017  . Allergic asthma, mild intermittent, uncomplicated 174/16/3845 . Obesity, Class I, BMI 30-34.9 04/16/2015  . Conjunctivitis, allergic, chronic 10/01/2012  . Hypertriglyceridemia 09/22/2011  . Rosacea 06/25/2011  . Irritable bowel syndrome with diarrhea 07/08/2010  . Benign essential hypertension 05/23/2010  . CKD (chronic kidney disease) stage 3, GFR 30-59 ml/min 05/23/2010  . Chronic allergic rhinitis 02/25/2010  . GERD (gastroesophageal reflux disease) 02/25/2010  . Psoriasis 02/25/2010  . Osteoarthritis, multiple sites 06/12/2009    RScot Jun PTA 04/14/2019, 11:46 AM  [place] [address]Phone: [phone]  Fax:  [phone] Name: EElsye Ramirez MRN: [id number] of Birth: [birth year]

## 2019-04-18 ENCOUNTER — Other Ambulatory Visit: Payer: Self-pay

## 2019-04-18 ENCOUNTER — Ambulatory Visit: Payer: Medicare PPO | Admitting: Physical Therapy

## 2019-04-18 DIAGNOSIS — R262 Difficulty in walking, not elsewhere classified: Secondary | ICD-10-CM | POA: Diagnosis not present

## 2019-04-18 DIAGNOSIS — M62830 Muscle spasm of back: Secondary | ICD-10-CM | POA: Diagnosis not present

## 2019-04-18 DIAGNOSIS — G8929 Other chronic pain: Secondary | ICD-10-CM | POA: Diagnosis not present

## 2019-04-18 DIAGNOSIS — M25512 Pain in left shoulder: Secondary | ICD-10-CM | POA: Diagnosis not present

## 2019-04-18 DIAGNOSIS — M2569 Stiffness of other specified joint, not elsewhere classified: Secondary | ICD-10-CM | POA: Diagnosis not present

## 2019-04-18 DIAGNOSIS — M25612 Stiffness of left shoulder, not elsewhere classified: Secondary | ICD-10-CM

## 2019-04-18 DIAGNOSIS — M6281 Muscle weakness (generalized): Secondary | ICD-10-CM | POA: Diagnosis not present

## 2019-04-18 DIAGNOSIS — M545 Low back pain: Secondary | ICD-10-CM | POA: Diagnosis not present

## 2019-04-18 NOTE — Therapy (Signed)
[address] [place], [name], [address] Phone: [phone]   Fax:  [phone]  Physical Therapy Treatment  Patient Details  Name: Sabrina Ramirez MRN: [id number] Date of Birth: [birth year] Referring Provider (PT): Billey Chang   Encounter Date: 04/18/2019  PT End of Session - 04/18/19 1232    Visit Number  19    PT Start Time  1150    PT Stop Time  3570    PT Time Calculation (min)  57 min       Past Medical History:  Diagnosis Date  . Allergic asthma, mild intermittent, uncomplicated 17/79/3903   Dr. Donneta Romberg, allergist  . Arthritis   . Chronic kidney disease    Stage III kidney disease  . GERD (gastroesophageal reflux disease)   . Hemorrhoids   . History of kidney stones   . Hypertension   . IBS (irritable bowel syndrome)   . IFG (impaired fasting glucose) 03/09/2019   a1c 6.3 02/2019  . Psoriasis     Past Surgical History:  Procedure Laterality Date  . ABDOMINAL HYSTERECTOMY  1987  . CATARACT EXTRACTION W/ INTRAOCULAR LENS  IMPLANT, BILATERAL Bilateral 2015  . CHOLECYSTECTOMY    . HEMORRHOID SURGERY    . SPINE SURGERY  07/10/2017  . TONSILLECTOMY      There were no vitals filed for this visit.  Subjective Assessment - 04/18/19 1154    Subjective  "doing much better today'.    Currently in Pain?  No/denies                       [facility] Adult PT Treatment/Exercise - 04/18/19 0001      Ambulation/Gait   Ambulation/Gait  Yes    Ambulation/Gait Assistance  7: Independent    Assistive device  None    Ambulation Surface  Level;Indoor      Lumbar Exercises: Aerobic   Nustep  L4 x  72mn      Shoulder Exercises: Seated   Other Seated Exercises  ab sets 2 x 10      Shoulder Exercises: Standing   External Rotation  Both;20 reps;Theraband;Strengthening    Theraband Level (Shoulder External Rotation)  Level 2 (Red)    Internal Rotation  Left;20 reps;Theraband    Theraband Level (Shoulder Internal Rotation)  Level 2 (Red)    Flexion  Strengthening;Both;20 reps;Weights    Shoulder Flexion Weight (lbs)  2    ABduction  Strengthening;Both;20 reps;Weights    Shoulder ABduction Weight (lbs)  2    Extension  Weights;Both;20 reps;Strengthening    Theraband Level (Shoulder Extension)  Level 2 (Red)      Moist Heat Therapy   Number Minutes Moist Heat  15 Minutes    Moist Heat Location  Shoulder      Electrical Stimulation   Electrical Stimulation Location  L shoulder    Electrical Stimulation Action  Premod    Electrical Stimulation Parameters  supine    Electrical Stimulation Goals  Pain      Manual Therapy   Manual Therapy  Passive ROM    Passive ROM  L shoulder all directions.               PT Short Term Goals - 02/03/19 1651      PT SHORT TERM GOAL #1   Title  Patient independent with HEP    Status  Achieved        PT Long Term  Goals - 03/31/19 1106      PT LONG TERM GOAL #1   Title  Patient able to walk to grocery shop without pain >4/10    Status  Partially Met      PT LONG TERM GOAL #2   Title  stand to cook meals wihtout sitting to rest    Status  Partially Met      PT LONG TERM GOAL #3   Title  Patient able to ascend/descend stairs at church with minimal pain.            Plan - 04/18/19 1235    Clinical Impression Statement  pt arrived five minutes late. pt able to increase weight with shoulder flex/abd. pt able to walk down the hall to increase endurance, but needs rest breaks due to fatigue and SOB. pt able to tolerate standing exercises for a minute before having complains of pain. pt getting better at relaxing with PROM.    Personal Factors and Comorbidities  Comorbidity 3+    Comorbidities  GERD, IBS, lumbar surgery    Stability/Clinical Decision Making  Stable/Uncomplicated    Rehab Potential  Good    PT Frequency  2x / week    PT Duration  8 weeks    PT Treatment/Interventions  ADLs/Self Care Home  Management;Cryotherapy;Electrical Stimulation;Iontophoresis 61m/ml Dexamethasone;Moist Heat;Ultrasound;Therapeutic activities;Therapeutic exercise;Neuromuscular re-education;Patient/family education;Dry needling;Manual techniques    PT Next Visit Plan  work on shoulder ROM and increasing functional endurance       Patient will benefit from skilled therapeutic intervention in order to improve the following deficits and impairments:  Pain, Improper body mechanics, Postural dysfunction, Increased muscle spasms, Decreased activity tolerance, Decreased range of motion, Decreased strength, Difficulty walking, Impaired flexibility, Impaired UE functional use  Visit Diagnosis: Difficulty in walking, not elsewhere classified  Stiffness of left shoulder, not elsewhere classified     Problem List Patient Active Problem List   Diagnosis Date Noted  . IFG (impaired fasting glucose) 03/09/2019  . Localized primary osteoarthritis of carpometacarpal joint of left thumb 03/08/2019  . On statin therapy due to risk of future cardiovascular event 12/01/2017  . Spondylolisthesis at L5-S1 level 08/13/2017  . Colon polyp 05/25/2017  . Ophthalmic migraine 05/25/2017  . Essential tremor 05/25/2017  . Allergic asthma, mild intermittent, uncomplicated 148/18/5909 . Obesity, Class I, BMI 30-34.9 04/16/2015  . Conjunctivitis, allergic, chronic 10/01/2012  . Hypertriglyceridemia 09/22/2011  . Rosacea 06/25/2011  . Irritable bowel syndrome with diarrhea 07/08/2010  . Benign essential hypertension 05/23/2010  . CKD (chronic kidney disease) stage 3, GFR 30-59 ml/min 05/23/2010  . Chronic allergic rhinitis 02/25/2010  . GERD (gastroesophageal reflux disease) 02/25/2010  . Psoriasis 02/25/2010  . Osteoarthritis, multiple sites 06/12/2009    SBarrett Henle SAlaska11/02/2019, 12:39 PM  [place] [address]Phone:  [phone]  Fax:  [phone] Name: Sabrina Ramirez MRN: [id number] of Birth: [birth year]

## 2019-04-19 ENCOUNTER — Telehealth: Payer: Self-pay | Admitting: Family Medicine

## 2019-04-21 ENCOUNTER — Ambulatory Visit: Payer: Medicare PPO | Admitting: Physical Therapy

## 2019-04-25 ENCOUNTER — Other Ambulatory Visit: Payer: Self-pay

## 2019-04-25 ENCOUNTER — Ambulatory Visit: Payer: Medicare PPO | Admitting: Physical Therapy

## 2019-04-25 DIAGNOSIS — M6281 Muscle weakness (generalized): Secondary | ICD-10-CM

## 2019-04-25 DIAGNOSIS — M25612 Stiffness of left shoulder, not elsewhere classified: Secondary | ICD-10-CM | POA: Diagnosis not present

## 2019-04-25 DIAGNOSIS — M25512 Pain in left shoulder: Secondary | ICD-10-CM | POA: Diagnosis not present

## 2019-04-25 DIAGNOSIS — M62830 Muscle spasm of back: Secondary | ICD-10-CM | POA: Diagnosis not present

## 2019-04-25 DIAGNOSIS — M545 Low back pain: Secondary | ICD-10-CM | POA: Diagnosis not present

## 2019-04-25 DIAGNOSIS — G8929 Other chronic pain: Secondary | ICD-10-CM | POA: Diagnosis not present

## 2019-04-25 DIAGNOSIS — R262 Difficulty in walking, not elsewhere classified: Secondary | ICD-10-CM | POA: Diagnosis not present

## 2019-04-25 DIAGNOSIS — M2569 Stiffness of other specified joint, not elsewhere classified: Secondary | ICD-10-CM | POA: Diagnosis not present

## 2019-04-25 NOTE — Therapy (Addendum)
Sabrina Ramirez, [name], [address] Phone: [phone]   Fax:  [phone] Progress Note Reporting Period 03/16/19 to 04/25/19 for visits 11-20  See note below for Objective Data and Assessment of Progress/Goals.      Physical Therapy Treatment  Patient Details  Name: Sabrina Ramirez MRN: [id number] Date of Birth: Jul 30, 1940 Referring Provider (PT): Billey Chang   Encounter Date: 04/25/2019  PT End of Session - 04/25/19 1105    Visit Number  20    PT Start Time  1027    PT Stop Time  1120    PT Time Calculation (min)  53 min       Past Medical History:  Diagnosis Date  . Allergic asthma, mild intermittent, uncomplicated 22/63/3354   Dr. Donneta Romberg, allergist  . Arthritis   . Chronic kidney disease    Stage III kidney disease  . GERD (gastroesophageal reflux disease)   . Hemorrhoids   . History of kidney stones   . Hypertension   . IBS (irritable bowel syndrome)   . IFG (impaired fasting glucose) 03/09/2019   a1c 6.3 02/2019  . Psoriasis     Past Surgical History:  Procedure Laterality Date  . ABDOMINAL HYSTERECTOMY  1987  . CATARACT EXTRACTION W/ INTRAOCULAR LENS  IMPLANT, BILATERAL Bilateral 2015  . CHOLECYSTECTOMY    . HEMORRHOID SURGERY    . SPINE SURGERY  07/10/2017  . TONSILLECTOMY      There were no vitals filed for this visit.  Subjective Assessment - 04/25/19 1029    Subjective  doing pretty good, a little pain in the back    Currently in Pain?  Yes    Pain Score  2     Pain Location  Back                       [facility] Adult PT Treatment/Exercise - 04/25/19 0001      Ambulation/Gait   Gait Comments  1 flight of stairs, left hand hold, step over step      Lumbar Exercises: Aerobic   Nustep  L4 x  91mn      Lumbar Exercises: Standing   Other Standing Lumbar Exercises  lumbar ext with pulley x10      Lumbar Exercises: Seated   Other Seated Lumbar  Exercises  rows and lat pulls 2 x 10 15#      Shoulder Exercises: Standing   Flexion  Strengthening;Both;20 reps;Weights    Shoulder Flexion Weight (lbs)  2    ABduction  Strengthening;Both;20 reps;Weights    Shoulder ABduction Weight (lbs)  2      Moist Heat Therapy   Number Minutes Moist Heat  15 Minutes    Moist Heat Location  Shoulder;Lumbar Spine      Electrical Stimulation   Electrical Stimulation Location  L shoulder    Electrical Stimulation Action  Premod    Electrical Stimulation Parameters  supine    Electrical Stimulation Goals  Pain      Manual Therapy   Manual Therapy  Passive ROM    Passive ROM  L shoulder all directions.               PT Short Term Goals - 02/03/19 1651      PT SHORT TERM GOAL #1   Title  Patient independent with HEP    Status  Achieved        PT Long  Term Goals - 03/31/19 1106      PT LONG TERM GOAL #1   Title  Patient able to walk to grocery shop without pain >4/10    Status  Partially Met      PT LONG TERM GOAL #2   Title  stand to cook meals wihtout sitting to rest    Status  Partially Met      PT LONG TERM GOAL #3   Title  Patient able to ascend/descend stairs at church with minimal pain.            Plan - 04/25/19 1032    Clinical Impression Statement  pt arrived 12 minutes late. pt able to go up and down a full flight of stairs. she needed a rest break afterward due to fatigue. pt attemped lumbar extension with pulley but was unable to complete the second set due to pain. pt is relaxing more with PROM. pt had no pain increase with shoulder flexion/abduction with weight. pt needs cues for lat pulls to improve form and technique.    Personal Factors and Comorbidities  Comorbidity 3+    Comorbidities  GERD, IBS, lumbar surgery    Rehab Potential  Good    PT Frequency  2x / week    PT Duration  8 weeks    PT Treatment/Interventions  ADLs/Self Care Home Management;Cryotherapy;Electrical Stimulation;Iontophoresis  21m/ml Dexamethasone;Moist Heat;Ultrasound;Therapeutic activities;Therapeutic exercise;Neuromuscular re-education;Patient/family education;Dry needling;Manual techniques    PT Next Visit Plan  work on shoulder ROM and increasing functional endurance       Patient will benefit from skilled therapeutic intervention in order to improve the following deficits and impairments:  Pain, Improper body mechanics, Postural dysfunction, Increased muscle spasms, Decreased activity tolerance, Decreased range of motion, Decreased strength, Difficulty walking, Impaired flexibility, Impaired UE functional use  Visit Diagnosis: Muscle spasm of back  Muscle weakness (generalized)     Problem List Patient Active Problem List   Diagnosis Date Noted  . IFG (impaired fasting glucose) 03/09/2019  . Localized primary osteoarthritis of carpometacarpal joint of left thumb 03/08/2019  . On statin therapy due to risk of future cardiovascular event 12/01/2017  . Spondylolisthesis at L5-S1 level 08/13/2017  . Colon polyp 05/25/2017  . Ophthalmic migraine 05/25/2017  . Essential tremor 05/25/2017  . Allergic asthma, mild intermittent, uncomplicated 138/46/6599 . Obesity, Class I, BMI 30-34.9 04/16/2015  . Conjunctivitis, allergic, chronic 10/01/2012  . Hypertriglyceridemia 09/22/2011  . Rosacea 06/25/2011  . Irritable bowel syndrome with diarrhea 07/08/2010  . Benign essential hypertension 05/23/2010  . CKD (chronic kidney disease) stage 3, GFR 30-59 ml/min 05/23/2010  . Chronic allergic rhinitis 02/25/2010  . GERD (gastroesophageal reflux disease) 02/25/2010  . Psoriasis 02/25/2010  . Osteoarthritis, multiple sites 06/12/2009    SBarrett Henle SAlaska11/16/2020, 11:10 AM  [place] [address]Phone: [phone]  Fax:  [phone] Name: Sabrina TrilloDay MRN: [id number] of Birth: 105/25/1942

## 2019-04-28 ENCOUNTER — Other Ambulatory Visit: Payer: Self-pay

## 2019-04-28 ENCOUNTER — Ambulatory Visit: Payer: Medicare PPO | Admitting: Physical Therapy

## 2019-04-28 ENCOUNTER — Encounter: Payer: Self-pay | Admitting: Physical Therapy

## 2019-04-28 DIAGNOSIS — M545 Low back pain, unspecified: Secondary | ICD-10-CM

## 2019-04-28 DIAGNOSIS — M25612 Stiffness of left shoulder, not elsewhere classified: Secondary | ICD-10-CM

## 2019-04-28 DIAGNOSIS — R262 Difficulty in walking, not elsewhere classified: Secondary | ICD-10-CM | POA: Diagnosis not present

## 2019-04-28 DIAGNOSIS — M62830 Muscle spasm of back: Secondary | ICD-10-CM | POA: Diagnosis not present

## 2019-04-28 DIAGNOSIS — M25512 Pain in left shoulder: Secondary | ICD-10-CM | POA: Diagnosis not present

## 2019-04-28 DIAGNOSIS — G8929 Other chronic pain: Secondary | ICD-10-CM

## 2019-04-28 DIAGNOSIS — M2569 Stiffness of other specified joint, not elsewhere classified: Secondary | ICD-10-CM | POA: Diagnosis not present

## 2019-04-28 DIAGNOSIS — M6281 Muscle weakness (generalized): Secondary | ICD-10-CM | POA: Diagnosis not present

## 2019-04-28 MED ORDER — FLUOCINONIDE 0.05 % EX CREA: TOPICAL_CREAM | CUTANEOUS | 0 refills | Status: DC

## 2019-04-28 NOTE — Telephone Encounter (Signed)
Patient, calling from pharmacy, states that they never received this RX. Patient is requesting medication be resent.

## 2019-04-28 NOTE — Telephone Encounter (Signed)
See note

## 2019-04-28 NOTE — Therapy (Signed)
[address] [place], [name], [address] Phone: [phone]   Fax:  [phone]  Physical Therapy Treatment  Patient Details  Name: Sabrina Ramirez MRN: [id number] Date of Birth: April 03, 1941 Referring Provider (PT): Billey Chang   Encounter Date: 04/28/2019  PT End of Session - 04/28/19 1051    Visit Number  21    Date for PT Re-Evaluation     Authorization Type  [employer]    PT Start Time  1051    PT Stop Time  1147    PT Time Calculation (min)  56 min       Past Medical History:  Diagnosis Date  . Allergic asthma, mild intermittent, uncomplicated 26/20/3559   Dr. Donneta Romberg, allergist  . Arthritis   . Chronic kidney disease    Stage III kidney disease  . GERD (gastroesophageal reflux disease)   . Hemorrhoids   . History of kidney stones   . Hypertension   . IBS (irritable bowel syndrome)   . IFG (impaired fasting glucose) 03/09/2019   a1c 6.3 02/2019  . Psoriasis     Past Surgical History:  Procedure Laterality Date  . ABDOMINAL HYSTERECTOMY  1987  . CATARACT EXTRACTION W/ INTRAOCULAR LENS  IMPLANT, BILATERAL Bilateral 2015  . CHOLECYSTECTOMY    . HEMORRHOID SURGERY    . SPINE SURGERY  07/10/2017  . TONSILLECTOMY      There were no vitals filed for this visit.  Subjective Assessment - 04/28/19 1051    Subjective  Pt reports her back is sore - thinks it is weather related,  Shoulder is good - washed her hair this morning and was challenged with reaching behing her head    Pertinent History  had a fusion 3 level early 2019    Patient Stated Goals  less pain, more motions, less pain for hair and dressing    Currently in Pain?  Yes    Pain Score  3     Pain Location  Back    Pain Orientation  Lower    Pain Descriptors / Indicators  Dull    Pain Type  Chronic pain    Pain Onset  More than a month ago    Pain Frequency  Intermittent    Aggravating Factors   holding arms up over her head    Pain Relieving Factors  rest                       [facility] Adult PT Treatment/Exercise - 04/28/19 0001      Lumbar Exercises: Stretches   Single Knee to Chest Stretch  Left;Right;30 seconds    Lower Trunk Rotation  2 reps;20 seconds   each side     Lumbar Exercises: Aerobic   Nustep  L5x6'      Lumbar Exercises: Standing   Other Standing Lumbar Exercises  dead lift to 8" stool, no wt then with 10# 3x5 reps, lowered to 12" step for last two sets      Shoulder Exercises: Seated   Other Seated Exercises  3x8 FWD reach holding 5# with rolled up pillow behind her back.       Shoulder Exercises: Standing   External Rotation  Strengthening;Both;15 reps;Theraband   one side at a time, leaning on foam roller, towel under arm   Theraband Level (Shoulder External Rotation)  Level 2 (Red)    Row  Strengthening;Both;15 reps;Theraband   leaning against whole bolster  Theraband Level (Shoulder Row)  Level 2 (Red)    Row Limitations  elbows bent and straight - had to rest d/t low back fatigue       Modalities   Modalities  Electrical Stimulation;Moist Heat      Moist Heat Therapy   Number Minutes Moist Heat  15 Minutes    Moist Heat Location  Shoulder;Lumbar Spine   seated     Electrical Stimulation   Electrical Stimulation Location  Lt shoulder and lumbar    Electrical Stimulation Action  premod    Electrical Stimulation Parameters  to tolerance - supine    Electrical Stimulation Goals  Pain      Manual Therapy   Manual Therapy  Passive ROM    Passive ROM  L shoulder all directions.               PT Short Term Goals - 02/03/19 1651      PT SHORT TERM GOAL #1   Title  Patient independent with HEP    Status  Achieved        PT Long Term Goals - 03/31/19 1106      PT LONG TERM GOAL #1   Title  Patient able to walk to grocery shop without pain >4/10    Status  Partially Met      PT LONG TERM GOAL #2   Title  stand to cook meals wihtout sitting to  rest    Status  Partially Met      PT LONG TERM GOAL #3   Title  Patient able to ascend/descend stairs at church with minimal pain.            Plan - 04/28/19 1219    Clinical Impression Statement  Sabrina Ramirez reports she really wishes to walk further and be able to garden this spring.  She was able to perform some higher level functional activities for strenghtening for her back with no increase in pain only tightness She would benefit from contiued tx and progression to functional strengthening to include Lt RTC isolation    Rehab Potential  Good    PT Frequency  2x / week    PT Duration  8 weeks    PT Treatment/Interventions  ADLs/Self Care Home Management;Cryotherapy;Electrical Stimulation;Iontophoresis 47m/ml Dexamethasone;Moist Heat;Ultrasound;Therapeutic activities;Therapeutic exercise;Neuromuscular re-education;Patient/family education;Dry needling;Manual techniques    PT Next Visit Plan  functional endurance/strengthening for back and Lt shoulder    Consulted and Agree with Plan of Care  Patient       Patient will benefit from skilled therapeutic intervention in order to improve the following deficits and impairments:  Pain, Improper body mechanics, Postural dysfunction, Increased muscle spasms, Decreased activity tolerance, Decreased range of motion, Decreased strength, Difficulty walking, Impaired flexibility, Impaired UE functional use  Visit Diagnosis: Muscle spasm of back  Muscle weakness (generalized)  Difficulty in walking, not elsewhere classified  Stiffness of left shoulder, not elsewhere classified  Chronic left shoulder pain  Back stiffness  Acute right-sided low back pain without sciatica     Problem List Patient Active Problem List   Diagnosis Date Noted  . IFG (impaired fasting glucose) 03/09/2019  . Localized primary osteoarthritis of carpometacarpal joint of left thumb 03/08/2019  . On statin therapy due to risk of future cardiovascular event  12/01/2017  . Spondylolisthesis at L5-S1 level 08/13/2017  . Colon polyp 05/25/2017  . Ophthalmic migraine 05/25/2017  . Essential tremor 05/25/2017  . Allergic asthma, mild intermittent, uncomplicated 100/92/3300 . Obesity, Class  I, BMI 30-34.9 04/16/2015  . Conjunctivitis, allergic, chronic 10/01/2012  . Hypertriglyceridemia 09/22/2011  . Rosacea 06/25/2011  . Irritable bowel syndrome with diarrhea 07/08/2010  . Benign essential hypertension 05/23/2010  . CKD (chronic kidney disease) stage 3, GFR 30-59 ml/min 05/23/2010  . Chronic allergic rhinitis 02/25/2010  . GERD (gastroesophageal reflux disease) 02/25/2010  . Psoriasis 02/25/2010  . Osteoarthritis, multiple sites 06/12/2009    Jeral Pinch PT  04/28/2019, 12:25 PM  [address] [place], [name], [address] Phone: [phone]   Fax:  [phone]  Name: Lexis Potenza Fillingim MRN: [id number] Date of Birth: 1941/03/02

## 2019-04-28 NOTE — Telephone Encounter (Signed)
Rx sent to pharmacy   

## 2019-05-02 ENCOUNTER — Ambulatory Visit: Payer: Medicare PPO | Admitting: Physical Therapy

## 2019-05-02 ENCOUNTER — Other Ambulatory Visit: Payer: Self-pay

## 2019-05-02 DIAGNOSIS — M545 Low back pain: Secondary | ICD-10-CM | POA: Diagnosis not present

## 2019-05-02 DIAGNOSIS — M62830 Muscle spasm of back: Secondary | ICD-10-CM

## 2019-05-02 DIAGNOSIS — R262 Difficulty in walking, not elsewhere classified: Secondary | ICD-10-CM | POA: Diagnosis not present

## 2019-05-02 DIAGNOSIS — M6281 Muscle weakness (generalized): Secondary | ICD-10-CM | POA: Diagnosis not present

## 2019-05-02 DIAGNOSIS — G8929 Other chronic pain: Secondary | ICD-10-CM | POA: Diagnosis not present

## 2019-05-02 DIAGNOSIS — M2569 Stiffness of other specified joint, not elsewhere classified: Secondary | ICD-10-CM | POA: Diagnosis not present

## 2019-05-02 DIAGNOSIS — M25512 Pain in left shoulder: Secondary | ICD-10-CM | POA: Diagnosis not present

## 2019-05-02 DIAGNOSIS — M25612 Stiffness of left shoulder, not elsewhere classified: Secondary | ICD-10-CM | POA: Diagnosis not present

## 2019-05-02 NOTE — Therapy (Signed)
[address] [place], [name], [address] Phone: [phone]   Fax:  [phone]  Physical Therapy Treatment  Patient Details  Name: Sabrina Ramirez MRN: [id number] Date of Birth: 1940-10-18 Referring Provider (PT): Billey Chang   Encounter Date: 05/02/2019  PT End of Session - 05/02/19 1140    Visit Number  22    PT Start Time  1100    PT Stop Time  7062    PT Time Calculation (min)  55 min       Past Medical History:  Diagnosis Date  . Allergic asthma, mild intermittent, uncomplicated 37/62/8315   Dr. Donneta Romberg, allergist  . Arthritis   . Chronic kidney disease    Stage III kidney disease  . GERD (gastroesophageal reflux disease)   . Hemorrhoids   . History of kidney stones   . Hypertension   . IBS (irritable bowel syndrome)   . IFG (impaired fasting glucose) 03/09/2019   a1c 6.3 02/2019  . Psoriasis     Past Surgical History:  Procedure Laterality Date  . ABDOMINAL HYSTERECTOMY  1987  . CATARACT EXTRACTION W/ INTRAOCULAR LENS  IMPLANT, BILATERAL Bilateral 2015  . CHOLECYSTECTOMY    . HEMORRHOID SURGERY    . SPINE SURGERY  07/10/2017  . TONSILLECTOMY      There were no vitals filed for this visit.  Subjective Assessment - 05/02/19 1106    Subjective  pt reports that her back is sore.    Currently in Pain?  Yes    Pain Score  3     Pain Location  Back    Pain Orientation  Lower                       [facility] Adult PT Treatment/Exercise - 05/02/19 0001      Lumbar Exercises: Aerobic   UBE (Upper Arm Bike)  L1 3 min each way      Recumbent Bike  L1 x 1 min   stopped due to pain     Lumbar Exercises: Machines for Strengthening   Cybex Lumbar Extension  B Tband Ext 2x10    Other Lumbar Machine Exercise  Rows & Lats 15lb 2x15      Lumbar Exercises: Supine   Other Supine Lumbar Exercises  LTR and K2C 2 x 10      Shoulder Exercises: Standing   Extension   Strengthening;Both;20 reps;Theraband    Theraband Level (Shoulder Extension)  Level 3 (Green)      Moist Heat Therapy   Number Minutes Moist Heat  15 Minutes    Moist Heat Location  Shoulder;Lumbar Spine      Electrical Stimulation   Electrical Stimulation Location  Lt shoulder and lumbar    Electrical Stimulation Action  premod    Electrical Stimulation Parameters  supine    Electrical Stimulation Goals  Pain               PT Short Term Goals - 02/03/19 1651      PT SHORT TERM GOAL #1   Title  Patient independent with HEP    Status  Achieved        PT Long Term Goals - 05/02/19 1110      PT LONG TERM GOAL #1   Title  Patient able to walk to grocery shop without pain >4/10    Status  Achieved      PT LONG TERM GOAL #2  Title  stand to cook meals wihtout sitting to rest    Status  Partially Met      PT LONG TERM GOAL #3   Title  Patient able to ascend/descend stairs at church with minimal pain.    Status  Partially Met      PT LONG TERM GOAL #4   Title  Patient to demo 5/5 BLE strength to improve function.    Status  Partially Met            Plan - 05/02/19 1107    Clinical Impression Statement  pt attemptd to warm up on the recumbent bike. it caused her pain to increase, so she was moved to the UBE. pt progressed to green theraband for shoulder extension. pt complains of pain with lumbar extension. pt needs cues for [facility] to improve form and technique.    Personal Factors and Comorbidities  Comorbidity 3+    Comorbidities  GERD, IBS, lumbar surgery    Stability/Clinical Decision Making  Stable/Uncomplicated    Rehab Potential  Good    PT Frequency  2x / week    PT Duration  8 weeks    PT Treatment/Interventions  ADLs/Self Care Home Management;Cryotherapy;Electrical Stimulation;Iontophoresis 96m/ml Dexamethasone;Moist Heat;Ultrasound;Therapeutic activities;Therapeutic exercise;Neuromuscular re-education;Patient/family education;Dry needling;Manual  techniques    PT Next Visit Plan  functional endurance/strengthening for back and Lt shoulder       Patient will benefit from skilled therapeutic intervention in order to improve the following deficits and impairments:  Pain, Improper body mechanics, Postural dysfunction, Increased muscle spasms, Decreased activity tolerance, Decreased range of motion, Decreased strength, Difficulty walking, Impaired flexibility, Impaired UE functional use  Visit Diagnosis: Muscle spasm of back     Problem List Patient Active Problem List   Diagnosis Date Noted  . IFG (impaired fasting glucose) 03/09/2019  . Localized primary osteoarthritis of carpometacarpal joint of left thumb 03/08/2019  . On statin therapy due to risk of future cardiovascular event 12/01/2017  . Spondylolisthesis at L5-S1 level 08/13/2017  . Colon polyp 05/25/2017  . Ophthalmic migraine 05/25/2017  . Essential tremor 05/25/2017  . Allergic asthma, mild intermittent, uncomplicated 181/85/9093 . Obesity, Class I, BMI 30-34.9 04/16/2015  . Conjunctivitis, allergic, chronic 10/01/2012  . Hypertriglyceridemia 09/22/2011  . Rosacea 06/25/2011  . Irritable bowel syndrome with diarrhea 07/08/2010  . Benign essential hypertension 05/23/2010  . CKD (chronic kidney disease) stage 3, GFR 30-59 ml/min 05/23/2010  . Chronic allergic rhinitis 02/25/2010  . GERD (gastroesophageal reflux disease) 02/25/2010  . Psoriasis 02/25/2010  . Osteoarthritis, multiple sites 06/12/2009    SBarrett Henle SAlaska11/23/2020, 11:48 AM  [place] [address]Phone: [phone]  Fax:  [phone] Name: Sabrina Ramirez MRN: [id number] of Birth: [birth year]

## 2019-05-09 ENCOUNTER — Ambulatory Visit: Payer: Medicare PPO | Admitting: Physical Therapy

## 2019-05-09 ENCOUNTER — Other Ambulatory Visit: Payer: Self-pay

## 2019-05-09 DIAGNOSIS — M25512 Pain in left shoulder: Secondary | ICD-10-CM | POA: Diagnosis not present

## 2019-05-09 DIAGNOSIS — M62830 Muscle spasm of back: Secondary | ICD-10-CM

## 2019-05-09 DIAGNOSIS — R262 Difficulty in walking, not elsewhere classified: Secondary | ICD-10-CM | POA: Diagnosis not present

## 2019-05-09 DIAGNOSIS — M6281 Muscle weakness (generalized): Secondary | ICD-10-CM | POA: Diagnosis not present

## 2019-05-09 DIAGNOSIS — M545 Low back pain: Secondary | ICD-10-CM | POA: Diagnosis not present

## 2019-05-09 DIAGNOSIS — M25612 Stiffness of left shoulder, not elsewhere classified: Secondary | ICD-10-CM | POA: Diagnosis not present

## 2019-05-09 DIAGNOSIS — G8929 Other chronic pain: Secondary | ICD-10-CM | POA: Diagnosis not present

## 2019-05-09 DIAGNOSIS — M2569 Stiffness of other specified joint, not elsewhere classified: Secondary | ICD-10-CM | POA: Diagnosis not present

## 2019-05-09 NOTE — Therapy (Signed)
[address] [name], [name], [address] Phone: [phone]   Fax:  [phone]  Physical Therapy Treatment  Patient Details  Name: Sabrina Ramirez MRN: [id number] Date of Birth: 29-Nov-1940 Referring Provider (PT): Billey Chang   Encounter Date: 05/09/2019  PT End of Session - 05/09/19 1345    Visit Number  23    PT Start Time  9407    PT Stop Time  1345    PT Time Calculation (min)  40 min       Past Medical History:  Diagnosis Date  . Allergic asthma, mild intermittent, uncomplicated 68/01/8109   Dr. Donneta Romberg, allergist  . Arthritis   . Chronic kidney disease    Stage III kidney disease  . GERD (gastroesophageal reflux disease)   . Hemorrhoids   . History of kidney stones   . Hypertension   . IBS (irritable bowel syndrome)   . IFG (impaired fasting glucose) 03/09/2019   a1c 6.3 02/2019  . Psoriasis     Past Surgical History:  Procedure Laterality Date  . ABDOMINAL HYSTERECTOMY  1987  . CATARACT EXTRACTION W/ INTRAOCULAR LENS  IMPLANT, BILATERAL Bilateral 2015  . CHOLECYSTECTOMY    . HEMORRHOID SURGERY    . SPINE SURGERY  07/10/2017  . TONSILLECTOMY      There were no vitals filed for this visit.  Subjective Assessment - 05/09/19 1308    Subjective  "some pain in the back, shoulder is doing pretty well."    Currently in Pain?  Yes    Pain Score  3     Pain Location  Back    Pain Orientation  Lower                       [facility] Adult PT Treatment/Exercise - 05/09/19 0001      Lumbar Exercises: Aerobic   Nustep  L5 x 6 min      Lumbar Exercises: Machines for Strengthening   Other Lumbar Machine Exercise  Rows & Lats 20lb 2x15      Lumbar Exercises: Standing   Other Standing Lumbar Exercises  lumbar extension on pulley x 10      Shoulder Exercises: Seated   Other Seated Exercises  OHP and chest press w/ red band 2 x 10      Shoulder Exercises: Standing   External Rotation  Strengthening;Both;15 reps;Theraband    Theraband Level (Shoulder External Rotation)  Level 3 (Green)    Internal Rotation  Left;20 reps;Theraband    Theraband Level (Shoulder Internal Rotation)  Level 3 (Green)    Other Standing Exercises  wall walks w/ red tband x5 in all directions      Manual Therapy   Manual Therapy  Passive ROM    Manual therapy comments  some gaurding    Passive ROM  L shoulder all directions.               PT Short Term Goals - 02/03/19 1651      PT SHORT TERM GOAL #1   Title  Patient independent with HEP    Status  Achieved        PT Long Term Goals - 05/02/19 1110      PT LONG TERM GOAL #1   Title  Patient able to walk to grocery shop without pain >4/10    Status  Achieved      PT LONG TERM GOAL #2  Title  stand to cook meals wihtout sitting to rest    Status  Partially Met      PT LONG TERM GOAL #3   Title  Patient able to ascend/descend stairs at church with minimal pain.    Status  Partially Met      PT LONG TERM GOAL #4   Title  Patient to demo 5/5 BLE strength to improve function.    Status  Partially Met            Plan - 05/09/19 1346    Clinical Impression Statement  pt able to increase weight with rows and lat pulls. pt only able to complete one sets on standing lumbar extension due to increased back pain. pt able to complete wall walks with red theraband. pt states she can feel her muscles working. pt needs cues with shoulder ER/IR to improve form and technique. pt needs cues to relax with PROM.    Comorbidities  GERD, IBS, lumbar surgery    Rehab Potential  Good    PT Frequency  2x / week    PT Duration  8 weeks    PT Treatment/Interventions  ADLs/Self Care Home Management;Cryotherapy;Electrical Stimulation;Iontophoresis 16m/ml Dexamethasone;Moist Heat;Ultrasound;Therapeutic activities;Therapeutic exercise;Neuromuscular re-education;Patient/family education;Dry needling;Manual techniques    PT Next  Visit Plan  functional endurance/strengthening for back and Lt shoulder       Patient will benefit from skilled therapeutic intervention in order to improve the following deficits and impairments:  Pain, Improper body mechanics, Postural dysfunction, Increased muscle spasms, Decreased activity tolerance, Decreased range of motion, Decreased strength, Difficulty walking, Impaired flexibility, Impaired UE functional use  Visit Diagnosis: Muscle spasm of back     Problem List Patient Active Problem List   Diagnosis Date Noted  . IFG (impaired fasting glucose) 03/09/2019  . Localized primary osteoarthritis of carpometacarpal joint of left thumb 03/08/2019  . On statin therapy due to risk of future cardiovascular event 12/01/2017  . Spondylolisthesis at L5-S1 level 08/13/2017  . Colon polyp 05/25/2017  . Ophthalmic migraine 05/25/2017  . Essential tremor 05/25/2017  . Allergic asthma, mild intermittent, uncomplicated 160/09/5995 . Obesity, Class I, BMI 30-34.9 04/16/2015  . Conjunctivitis, allergic, chronic 10/01/2012  . Hypertriglyceridemia 09/22/2011  . Rosacea 06/25/2011  . Irritable bowel syndrome with diarrhea 07/08/2010  . Benign essential hypertension 05/23/2010  . CKD (chronic kidney disease) stage 3, GFR 30-59 ml/min 05/23/2010  . Chronic allergic rhinitis 02/25/2010  . GERD (gastroesophageal reflux disease) 02/25/2010  . Psoriasis 02/25/2010  . Osteoarthritis, multiple sites 06/12/2009    SBarrett Henle SAlaska11/30/2020, 1:54 PM  [place] [address]Phone: [phone]  Fax:  [phone] Name: Sabrina Ramirez MRN: [id number] of Birth: [birth year]

## 2019-05-11 ENCOUNTER — Other Ambulatory Visit: Payer: Self-pay

## 2019-05-11 ENCOUNTER — Ambulatory Visit: Payer: Medicare PPO | Attending: Family Medicine | Admitting: Physical Therapy

## 2019-05-11 DIAGNOSIS — M6281 Muscle weakness (generalized): Secondary | ICD-10-CM | POA: Insufficient documentation

## 2019-05-11 DIAGNOSIS — M62830 Muscle spasm of back: Secondary | ICD-10-CM | POA: Diagnosis not present

## 2019-05-11 NOTE — Therapy (Signed)
[address] [place], [name], [address] Phone: [phone]   Fax:  [phone]  Physical Therapy Treatment  Patient Details  Name: Sabrina Ramirez MRN: [id number] Date of Birth: September 10, 1940 Referring Provider (PT): Billey Chang   Encounter Date: 05/11/2019  PT End of Session - 05/11/19 1101    Visit Number  24    PT Start Time  1025    PT Stop Time  1115    PT Time Calculation (min)  50 min       Past Medical History:  Diagnosis Date  . Allergic asthma, mild intermittent, uncomplicated 21/62/4469   Dr. Donneta Romberg, allergist  . Arthritis   . Chronic kidney disease    Stage III kidney disease  . GERD (gastroesophageal reflux disease)   . Hemorrhoids   . History of kidney stones   . Hypertension   . IBS (irritable bowel syndrome)   . IFG (impaired fasting glucose) 03/09/2019   a1c 6.3 02/2019  . Psoriasis     Past Surgical History:  Procedure Laterality Date  . ABDOMINAL HYSTERECTOMY  1987  . CATARACT EXTRACTION W/ INTRAOCULAR LENS  IMPLANT, BILATERAL Bilateral 2015  . CHOLECYSTECTOMY    . HEMORRHOID SURGERY    . SPINE SURGERY  07/10/2017  . TONSILLECTOMY      There were no vitals filed for this visit.  Subjective Assessment - 05/11/19 1028    Subjective  'shoulder in doing well, back is hurting"    Currently in Pain?  Yes    Pain Score  4     Pain Location  Back                       [facility] Adult PT Treatment/Exercise - 05/11/19 0001      Ambulation/Gait   Gait Comments  1 flight of stairs, left hand hold, step over step      Lumbar Exercises: Aerobic   Nustep  L5 x 6 min      Lumbar Exercises: Machines for Strengthening   Other Lumbar Machine Exercise  Rows & Lats 20lb 2x15      Lumbar Exercises: Standing   Other Standing Lumbar Exercises  lumbar extension  x 10 w/ weight, x10 w/o weight     Other Standing Lumbar Exercises  Hip flex, ext & abd 2.5#      Moist Heat  Therapy   Number Minutes Moist Heat  15 Minutes    Moist Heat Location  Shoulder;Lumbar Spine      Electrical Stimulation   Electrical Stimulation Location  Lt shoulder and lumbar    Electrical Stimulation Action  premod    Electrical Stimulation Parameters  supine    Electrical Stimulation Goals  Pain               PT Short Term Goals - 02/03/19 1651      PT SHORT TERM GOAL #1   Title  Patient independent with HEP    Status  Achieved        PT Long Term Goals - 05/11/19 1030      PT LONG TERM GOAL #2   Title  stand to cook meals wihtout sitting to rest    Status  Partially Met      PT LONG TERM GOAL #3   Title  Patient able to ascend/descend stairs at church with minimal pain.    Status  Partially Met  PT LONG TERM GOAL #4   Title  Patient to demo 5/5 BLE strength to improve function.    Status  Partially Met            Plan - 05/11/19 1103    Clinical Impression Statement  pt arrived ten minutes late. pt requires rest breaks secondary to fatigue and LBP. pt attempted to perform lumbar extension with resistance, it caused pain so the weight was taken off. pt able to perform stnading hip exercises. she is much weaker on the right side.    Personal Factors and Comorbidities  Comorbidity 3+    Comorbidities  GERD, IBS, lumbar surgery    Stability/Clinical Decision Making  Stable/Uncomplicated    Rehab Potential  Good    PT Frequency  2x / week    PT Duration  8 weeks    PT Treatment/Interventions  ADLs/Self Care Home Management;Cryotherapy;Electrical Stimulation;Iontophoresis 58m/ml Dexamethasone;Moist Heat;Ultrasound;Therapeutic activities;Therapeutic exercise;Neuromuscular re-education;Patient/family education;Dry needling;Manual techniques    PT Next Visit Plan  functional endurance/strengthening for back and Lt shoulder       Patient will benefit from skilled therapeutic intervention in order to improve the following deficits and impairments:   Pain, Improper body mechanics, Postural dysfunction, Increased muscle spasms, Decreased activity tolerance, Decreased range of motion, Decreased strength, Difficulty walking, Impaired flexibility, Impaired UE functional use  Visit Diagnosis: Muscle spasm of back  Muscle weakness (generalized)     Problem List Patient Active Problem List   Diagnosis Date Noted  . IFG (impaired fasting glucose) 03/09/2019  . Localized primary osteoarthritis of carpometacarpal joint of left thumb 03/08/2019  . On statin therapy due to risk of future cardiovascular event 12/01/2017  . Spondylolisthesis at L5-S1 level 08/13/2017  . Colon polyp 05/25/2017  . Ophthalmic migraine 05/25/2017  . Essential tremor 05/25/2017  . Allergic asthma, mild intermittent, uncomplicated 172/02/4708 . Obesity, Class I, BMI 30-34.9 04/16/2015  . Conjunctivitis, allergic, chronic 10/01/2012  . Hypertriglyceridemia 09/22/2011  . Rosacea 06/25/2011  . Irritable bowel syndrome with diarrhea 07/08/2010  . Benign essential hypertension 05/23/2010  . CKD (chronic kidney disease) stage 3, GFR 30-59 ml/min 05/23/2010  . Chronic allergic rhinitis 02/25/2010  . GERD (gastroesophageal reflux disease) 02/25/2010  . Psoriasis 02/25/2010  . Osteoarthritis, multiple sites 06/12/2009    SBarrett Henle S[place]12/07/2018, 11:10 AM  [place] [address]Phone: [phone]  Fax:  [phone] Name: EStormee DudaDay MRN: [id number] of Birth: 1June 06, 1942

## 2019-05-16 ENCOUNTER — Other Ambulatory Visit: Payer: Self-pay

## 2019-05-16 ENCOUNTER — Ambulatory Visit: Payer: Medicare PPO | Admitting: Physical Therapy

## 2019-05-16 ENCOUNTER — Encounter: Payer: Self-pay | Admitting: Physical Therapy

## 2019-05-16 DIAGNOSIS — M6281 Muscle weakness (generalized): Secondary | ICD-10-CM | POA: Diagnosis not present

## 2019-05-16 DIAGNOSIS — M62830 Muscle spasm of back: Secondary | ICD-10-CM | POA: Diagnosis not present

## 2019-05-16 NOTE — Therapy (Signed)
[address] [place], [name], [address] Phone: [phone]   Fax:  [phone]  Physical Therapy Treatment  Patient Details  Name: Sabrina Ramirez MRN: [id number] Date of Birth: 1940/09/23 Referring Provider (PT): Billey Chang   Encounter Date: 05/16/2019  PT End of Session - 05/16/19 1102    Visit Number  25    Date for PT Re-Evaluation  03/28/19    Authorization Type  [employer]    PT Start Time  1020    PT Stop Time  1112    PT Time Calculation (min)  52 min       Past Medical History:  Diagnosis Date  . Allergic asthma, mild intermittent, uncomplicated 88/91/6945   Dr. Donneta Romberg, allergist  . Arthritis   . Chronic kidney disease    Stage III kidney disease  . GERD (gastroesophageal reflux disease)   . Hemorrhoids   . History of kidney stones   . Hypertension   . IBS (irritable bowel syndrome)   . IFG (impaired fasting glucose) 03/09/2019   a1c 6.3 02/2019  . Psoriasis     Past Surgical History:  Procedure Laterality Date  . ABDOMINAL HYSTERECTOMY  1987  . CATARACT EXTRACTION W/ INTRAOCULAR LENS  IMPLANT, BILATERAL Bilateral 2015  . CHOLECYSTECTOMY    . HEMORRHOID SURGERY    . SPINE SURGERY  07/10/2017  . TONSILLECTOMY      There were no vitals filed for this visit.  Subjective Assessment - 05/16/19 1024    Subjective  "feeling pretty good". pt states her back was hurting last night but not this morning. pt reports that therapy is helping with the shoulder, but not as much with the back.    Currently in Pain?  Yes    Pain Score  2     Pain Location  Back    Pain Orientation  Lower                       [facility] Adult PT Treatment/Exercise - 05/16/19 0001      Lumbar Exercises: Aerobic   Nustep  L5 x 6 min      Lumbar Exercises: Machines for Strengthening   Other Lumbar Machine Exercise  Rows & Lats 25lb 2x10      Lumbar Exercises: Standing   Other Standing Lumbar  Exercises  seated flys    Other Standing Lumbar Exercises  hip ext and abd 2#, 2 x 10      Lumbar Exercises: Seated   Other Seated Lumbar Exercises  ab sets 2 x 10      Shoulder Exercises: Standing   Other Standing Exercises  scap stabilization with red t band      Moist Heat Therapy   Number Minutes Moist Heat  15 Minutes    Moist Heat Location  Shoulder;Lumbar Spine      Electrical Stimulation   Electrical Stimulation Location  Lt shoulder and lumbar    Electrical Stimulation Action  premod    Electrical Stimulation Parameters  supine    Electrical Stimulation Goals  Pain      Manual Therapy   Manual Therapy  Passive ROM    Manual therapy comments  some gaurding    Passive ROM  shoulder flexion and abduction               PT Short Term Goals - 02/03/19 1651      PT SHORT TERM GOAL #1  Title  Patient independent with HEP    Status  Achieved        PT Long Term Goals - 05/11/19 1030      PT LONG TERM GOAL #2   Title  stand to cook meals wihtout sitting to rest    Status  Partially Met      PT LONG TERM GOAL #3   Title  Patient able to ascend/descend stairs at church with minimal pain.    Status  Partially Met      PT LONG TERM GOAL #4   Title  Patient to demo 5/5 BLE strength to improve function.    Status  Partially Met            Plan - 05/16/19 1107    Clinical Impression Statement  pt arrived five mintues late. weight was reduced with hip extension and abduction due c/o 9/10 pain. pt able to perform flys with 2# weights. pt has reached ROM WNL. pt increased weight with rows and lat pulls. pt requires rest breaks secondaey to fatigue.    Personal Factors and Comorbidities  Comorbidity 3+    Comorbidities  GERD, IBS, lumbar surgery    Stability/Clinical Decision Making  Stable/Uncomplicated    Rehab Potential  Good    PT Frequency  2x / week    PT Duration  8 weeks    PT Treatment/Interventions  ADLs/Self Care Home  Management;Cryotherapy;Electrical Stimulation;Iontophoresis 70m/ml Dexamethasone;Moist Heat;Ultrasound;Therapeutic activities;Therapeutic exercise;Neuromuscular re-education;Patient/family education;Dry needling;Manual techniques    PT Next Visit Plan  functional endurance/strengthening for back and Lt shoulder       Patient will benefit from skilled therapeutic intervention in order to improve the following deficits and impairments:  Pain, Improper body mechanics, Postural dysfunction, Increased muscle spasms, Decreased activity tolerance, Decreased range of motion, Decreased strength, Difficulty walking, Impaired flexibility, Impaired UE functional use  Visit Diagnosis: Muscle spasm of back  Muscle weakness (generalized)     Problem List Patient Active Problem List   Diagnosis Date Noted  . IFG (impaired fasting glucose) 03/09/2019  . Localized primary osteoarthritis of carpometacarpal joint of left thumb 03/08/2019  . On statin therapy due to risk of future cardiovascular event 12/01/2017  . Spondylolisthesis at L5-S1 level 08/13/2017  . Colon polyp 05/25/2017  . Ophthalmic migraine 05/25/2017  . Essential tremor 05/25/2017  . Allergic asthma, mild intermittent, uncomplicated 124/58/0998 . Obesity, Class I, BMI 30-34.9 04/16/2015  . Conjunctivitis, allergic, chronic 10/01/2012  . Hypertriglyceridemia 09/22/2011  . Rosacea 06/25/2011  . Irritable bowel syndrome with diarrhea 07/08/2010  . Benign essential hypertension 05/23/2010  . CKD (chronic kidney disease) stage 3, GFR 30-59 ml/min 05/23/2010  . Chronic allergic rhinitis 02/25/2010  . GERD (gastroesophageal reflux disease) 02/25/2010  . Psoriasis 02/25/2010  . Osteoarthritis, multiple sites 06/12/2009    SBarrett Henle SAlaska12/12/2018, 11:51 AM  [place] [address]Phone: [phone]  Fax:  [phone] Name: Sabrina AmodioDay MRN: [id number] of Birth: [birth year]

## 2019-05-18 ENCOUNTER — Other Ambulatory Visit: Payer: Self-pay

## 2019-05-18 ENCOUNTER — Ambulatory Visit: Payer: Medicare PPO | Admitting: Physical Therapy

## 2019-05-18 DIAGNOSIS — M62830 Muscle spasm of back: Secondary | ICD-10-CM | POA: Diagnosis not present

## 2019-05-18 DIAGNOSIS — M6281 Muscle weakness (generalized): Secondary | ICD-10-CM | POA: Diagnosis not present

## 2019-05-18 NOTE — Therapy (Deleted)
[address] [place], [name], [address] Phone: [phone]   Fax:  [phone]  Physical Therapy Treatment  Patient Details  Name: Sabrina Ramirez MRN: [id number] Date of Birth: September 02, 1940 Referring Provider (PT): Billey Chang   Encounter Date: 05/18/2019  PT End of Session - 05/18/19 1056    Visit Number  26    Date for PT Re-Evaluation  03/28/19    Authorization Type  [employer]    PT Start Time  1021    PT Stop Time  1110    PT Time Calculation (min)  49 min       Past Medical History:  Diagnosis Date  . Allergic asthma, mild intermittent, uncomplicated 87/68/1157   Dr. Donneta Romberg, allergist  . Arthritis   . Chronic kidney disease    Stage III kidney disease  . GERD (gastroesophageal reflux disease)   . Hemorrhoids   . History of kidney stones   . Hypertension   . IBS (irritable bowel syndrome)   . IFG (impaired fasting glucose) 03/09/2019   a1c 6.3 02/2019  . Psoriasis     Past Surgical History:  Procedure Laterality Date  . ABDOMINAL HYSTERECTOMY  1987  . CATARACT EXTRACTION W/ INTRAOCULAR LENS  IMPLANT, BILATERAL Bilateral 2015  . CHOLECYSTECTOMY    . HEMORRHOID SURGERY    . SPINE SURGERY  07/10/2017  . TONSILLECTOMY      There were no vitals filed for this visit.  Subjective Assessment - 05/18/19 1023    Subjective  "feeling pretty good'. pt reports that her shoulder is feeling good and she is was able to put rollers in her hair today.    Pertinent History  had a fusion 3 level early 2019    Limitations  House hold activities    How long can you stand comfortably?  10 minutes needs to rest    How long can you walk comfortably?  5-10 minutes    Diagnostic tests  none recently    Patient Stated Goals  less pain, more motions, less pain for hair and dressing    Currently in Pain?  Yes    Pain Score  2     Pain Location  Back    Pain Orientation  Lower                        [facility] Adult PT Treatment/Exercise - 05/18/19 0001      Lumbar Exercises: Aerobic   Nustep  L5 x 6 min      Lumbar Exercises: Machines for Strengthening   Other Lumbar Machine Exercise  Rows & Lats 35lb 2x10      Shoulder Exercises: Seated   Other Seated Exercises  OHP and chest press 2 x 10    Other Seated Exercises  lumbar ext w/ black band      Moist Heat Therapy   Number Minutes Moist Heat  15 Minutes    Moist Heat Location  Shoulder;Lumbar Spine      Electrical Stimulation   Electrical Stimulation Location  Lt shoulder and lumbar    Electrical Stimulation Action  premod    Electrical Stimulation Parameters  supine    Electrical Stimulation Goals  Pain      Manual Therapy   Manual Therapy  Passive ROM    Passive ROM  in all directions               PT Short  Term Goals - 02/03/19 1651      PT SHORT TERM GOAL #1   Title  Patient independent with HEP    Status  Achieved        PT Long Term Goals - 05/18/19 1101      PT LONG TERM GOAL #2   Title  stand to cook meals wihtout sitting to rest    Status  Partially Met      PT LONG TERM GOAL #3   Title  Patient able to ascend/descend stairs at church with minimal pain.    Status  Partially Met      PT LONG TERM GOAL #4   Title  Patient to demo 5/5 BLE strength to improve function.    Status  Partially Met            Plan - 05/18/19 1058    Clinical Impression Statement  pt arrived six minutes late today. pt able to increase weight with rows and lat pulls. pt performed OHP and chest press with weighted ball. there was no increase of pain but pt hands start to shake with chest press. pt motion is improving with PROM.    Personal Factors and Comorbidities  Comorbidity 3+    Comorbidities  GERD, IBS, lumbar surgery    Stability/Clinical Decision Making  Stable/Uncomplicated    Rehab Potential  Good    PT Frequency  2x / week    PT Duration  8 weeks    PT  Treatment/Interventions  ADLs/Self Care Home Management;Cryotherapy;Electrical Stimulation;Iontophoresis 44m/ml Dexamethasone;Moist Heat;Ultrasound;Therapeutic activities;Therapeutic exercise;Neuromuscular re-education;Patient/family education;Dry needling;Manual techniques    PT Next Visit Plan  functional endurance/strengthening for back and Lt shoulder       Patient will benefit from skilled therapeutic intervention in order to improve the following deficits and impairments:  Pain, Improper body mechanics, Postural dysfunction, Increased muscle spasms, Decreased activity tolerance, Decreased range of motion, Decreased strength, Difficulty walking, Impaired flexibility, Impaired UE functional use  Visit Diagnosis: Muscle spasm of back     Problem List Patient Active Problem List   Diagnosis Date Noted  . IFG (impaired fasting glucose) 03/09/2019  . Localized primary osteoarthritis of carpometacarpal joint of left thumb 03/08/2019  . On statin therapy due to risk of future cardiovascular event 12/01/2017  . Spondylolisthesis at L5-S1 level 08/13/2017  . Colon polyp 05/25/2017  . Ophthalmic migraine 05/25/2017  . Essential tremor 05/25/2017  . Allergic asthma, mild intermittent, uncomplicated 102/54/2706 . Obesity, Class I, BMI 30-34.9 04/16/2015  . Conjunctivitis, allergic, chronic 10/01/2012  . Hypertriglyceridemia 09/22/2011  . Rosacea 06/25/2011  . Irritable bowel syndrome with diarrhea 07/08/2010  . Benign essential hypertension 05/23/2010  . CKD (chronic kidney disease) stage 3, GFR 30-59 ml/min 05/23/2010  . Chronic allergic rhinitis 02/25/2010  . GERD (gastroesophageal reflux disease) 02/25/2010  . Psoriasis 02/25/2010  . Osteoarthritis, multiple sites 06/12/2009    SBarrett Henle SAlaska12/02/2019, 11:02 AM  [place] [address]Phone: [phone]  Fax:   [phone] Name: Sabrina Ramirez MRN: [id number] of Birth: 101-25-1942

## 2019-05-18 NOTE — Therapy (Signed)
[address] [place], [name], [address] Phone: [phone]   Fax:  [phone]  Physical Therapy Treatment  Patient Details  Name: Sabrina Ramirez MRN: [id number] Date of Birth: Jul 28, 1940 Referring Provider (PT): Billey Chang   Encounter Date: 05/18/2019  PT End of Session - 05/18/19 1056    Visit Number  26    Date for PT Re-Evaluation  03/28/19    Authorization Type  [employer]    PT Start Time  1021    PT Stop Time  1110    PT Time Calculation (min)  49 min       Past Medical History:  Diagnosis Date  . Allergic asthma, mild intermittent, uncomplicated 24/26/8341   Dr. Donneta Romberg, allergist  . Arthritis   . Chronic kidney disease    Stage III kidney disease  . GERD (gastroesophageal reflux disease)   . Hemorrhoids   . History of kidney stones   . Hypertension   . IBS (irritable bowel syndrome)   . IFG (impaired fasting glucose) 03/09/2019   a1c 6.3 02/2019  . Psoriasis     Past Surgical History:  Procedure Laterality Date  . ABDOMINAL HYSTERECTOMY  1987  . CATARACT EXTRACTION W/ INTRAOCULAR LENS  IMPLANT, BILATERAL Bilateral 2015  . CHOLECYSTECTOMY    . HEMORRHOID SURGERY    . SPINE SURGERY  07/10/2017  . TONSILLECTOMY      There were no vitals filed for this visit.  Subjective Assessment - 05/18/19 1023    Subjective  "feeling pretty good'. pt reports that her shoulder is feeling good and she is was able to put rollers in her hair today.    Pertinent History  had a fusion 3 level early 2019    Limitations  House hold activities    How long can you stand comfortably?  10 minutes needs to rest    How long can you walk comfortably?  5-10 minutes    Diagnostic tests  none recently    Patient Stated Goals  less pain, more motions, less pain for hair and dressing    Currently in Pain?  Yes    Pain Score  2     Pain Location  Back    Pain Orientation  Lower                        [facility] Adult PT Treatment/Exercise - 05/18/19 0001      Lumbar Exercises: Aerobic   Nustep  L5 x 6 min      Lumbar Exercises: Machines for Strengthening   Other Lumbar Machine Exercise  Rows & Lats 35lb 2x10      Shoulder Exercises: Seated   Other Seated Exercises  OHP and chest press 2 x 10    Other Seated Exercises  lumbar ext w/ black band      Moist Heat Therapy   Number Minutes Moist Heat  15 Minutes    Moist Heat Location  Shoulder;Lumbar Spine      Electrical Stimulation   Electrical Stimulation Location  Lt shoulder and lumbar    Electrical Stimulation Action  premod    Electrical Stimulation Parameters  supine    Electrical Stimulation Goals  Pain      Manual Therapy   Manual Therapy  Passive ROM    Passive ROM  in all directions               PT Short  Term Goals - 02/03/19 1651      PT SHORT TERM GOAL #1   Title  Patient independent with HEP    Status  Achieved        PT Long Term Goals - 05/18/19 1101      PT LONG TERM GOAL #2   Title  stand to cook meals wihtout sitting to rest    Status  Partially Met      PT LONG TERM GOAL #3   Title  Patient able to ascend/descend stairs at church with minimal pain.    Status  Partially Met      PT LONG TERM GOAL #4   Title  Patient to demo 5/5 BLE strength to improve function.    Status  Partially Met            Plan - 05/18/19 1058    Clinical Impression Statement  pt arrived six minutes late today. pt able to increase weight with rows and lat pulls. pt performed OHP and chest press with weighted ball. there was no increase of pain but pt hands start to shake with chest press. pt motion is improving with PROM.    Personal Factors and Comorbidities  Comorbidity 3+    Comorbidities  GERD, IBS, lumbar surgery    Stability/Clinical Decision Making  Stable/Uncomplicated    Rehab Potential  Good    PT Frequency  2x / week    PT Duration  8 weeks    PT  Treatment/Interventions  ADLs/Self Care Home Management;Cryotherapy;Electrical Stimulation;Iontophoresis 65m/ml Dexamethasone;Moist Heat;Ultrasound;Therapeutic activities;Therapeutic exercise;Neuromuscular re-education;Patient/family education;Dry needling;Manual techniques    PT Next Visit Plan  functional endurance/strengthening for back and Lt shoulder       Patient will benefit from skilled therapeutic intervention in order to improve the following deficits and impairments:  Pain, Improper body mechanics, Postural dysfunction, Increased muscle spasms, Decreased activity tolerance, Decreased range of motion, Decreased strength, Difficulty walking, Impaired flexibility, Impaired UE functional use  Visit Diagnosis: Muscle spasm of back     Problem List Patient Active Problem List   Diagnosis Date Noted  . IFG (impaired fasting glucose) 03/09/2019  . Localized primary osteoarthritis of carpometacarpal joint of left thumb 03/08/2019  . On statin therapy due to risk of future cardiovascular event 12/01/2017  . Spondylolisthesis at L5-S1 level 08/13/2017  . Colon polyp 05/25/2017  . Ophthalmic migraine 05/25/2017  . Essential tremor 05/25/2017  . Allergic asthma, mild intermittent, uncomplicated 103/00/9233 . Obesity, Class I, BMI 30-34.9 04/16/2015  . Conjunctivitis, allergic, chronic 10/01/2012  . Hypertriglyceridemia 09/22/2011  . Rosacea 06/25/2011  . Irritable bowel syndrome with diarrhea 07/08/2010  . Benign essential hypertension 05/23/2010  . CKD (chronic kidney disease) stage 3, GFR 30-59 ml/min 05/23/2010  . Chronic allergic rhinitis 02/25/2010  . GERD (gastroesophageal reflux disease) 02/25/2010  . Psoriasis 02/25/2010  . Osteoarthritis, multiple sites 06/12/2009  PHYSICAL THERAPY DISCHARGE SUMMARY  Visits from Start of Care: 26 Plan: Patient agrees to discharge.  Patient goals were partially met. Patient is being discharged due to being pleased with the current  functional level.  ?????     RScot Ramirez PTA 05/18/2019, 11:17 AM [address] [address]Phone: [phone]  Fax:  [phone] Name: Sabrina Ramirez MRN: [id number] of Birth: 117-Jul-1942

## 2019-06-09 ENCOUNTER — Other Ambulatory Visit: Payer: Self-pay | Admitting: Family Medicine

## 2019-06-09 ENCOUNTER — Encounter: Payer: Self-pay | Admitting: Family Medicine

## 2019-06-09 DIAGNOSIS — Z1231 Encounter for screening mammogram for malignant neoplasm of breast: Secondary | ICD-10-CM

## 2019-06-14 ENCOUNTER — Ambulatory Visit: Payer: Medicare PPO

## 2019-07-02 ENCOUNTER — Ambulatory Visit: Payer: Medicare PPO | Attending: Internal Medicine

## 2019-07-02 DIAGNOSIS — Z23 Encounter for immunization: Secondary | ICD-10-CM | POA: Insufficient documentation

## 2019-07-02 NOTE — Progress Notes (Signed)
   Covid-19 Vaccination Clinic  Name:  Sabrina Ramirez    MRN: [id number] DOB: 07/06/1940  07/02/2019  Sabrina Ramirez was observed post Covid-19 immunization for 15 minutes without incidence. She was provided with Vaccine Information Sheet and instruction to access the V-Safe system.   Sabrina Ramirez was instructed to call 911 with any severe reactions post vaccine: Marland Kitchen Difficulty breathing  . Swelling of your face and throat  . A fast heartbeat  . A bad rash all over your body  . Dizziness and weakness    Immunizations Administered    Name Date Dose VIS Date Route   Pfizer COVID-19 Vaccine 07/02/2019  2:20 PM 0.3 mL 05/20/2019 Intramuscular   Manufacturer: [place]   Lot: [id number]   [place]: [id number]

## 2019-07-23 ENCOUNTER — Ambulatory Visit: Payer: Medicare PPO | Attending: Internal Medicine

## 2019-07-23 DIAGNOSIS — Z23 Encounter for immunization: Secondary | ICD-10-CM | POA: Insufficient documentation

## 2019-07-23 NOTE — Progress Notes (Signed)
   Covid-19 Vaccination Clinic  Name:  Sabrina Ramirez    MRN: [id number] DOB: 1941-02-06  07/23/2019  [name] was observed post Covid-19 immunization for 15 minutes without incidence. She was provided with Vaccine Information Sheet and instruction to access the V-Safe system.   [name] was instructed to call 911 with any severe reactions post vaccine: Marland Kitchen Difficulty breathing  . Swelling of your face and throat  . A fast heartbeat  . A bad rash all over your body  . Dizziness and weakness    Immunizations Administered    Name Date Dose VIS Date Route   Pfizer COVID-19 Vaccine 07/23/2019  1:15 PM 0.3 mL 05/20/2019 Intramuscular   Manufacturer: [place]   Lot: [id number]   [place]: [id number]

## 2019-08-08 ENCOUNTER — Other Ambulatory Visit: Payer: Self-pay | Admitting: Family Medicine

## 2019-09-05 ENCOUNTER — Ambulatory Visit: Payer: Medicare PPO | Admitting: Family Medicine

## 2019-09-14 ENCOUNTER — Other Ambulatory Visit: Payer: Self-pay

## 2019-09-14 ENCOUNTER — Ambulatory Visit
Admission: RE | Admit: 2019-09-14 | Discharge: 2019-09-14 | Disposition: A | Discharge status: Home / Self Care | Payer: Medicare PPO | Source: Ambulatory Visit | Attending: Family Medicine | Admitting: Family Medicine

## 2019-09-14 DIAGNOSIS — Z1231 Encounter for screening mammogram for malignant neoplasm of breast: Secondary | ICD-10-CM

## 2019-09-21 ENCOUNTER — Encounter: Payer: Self-pay | Admitting: Family Medicine

## 2019-09-21 ENCOUNTER — Ambulatory Visit (INDEPENDENT_AMBULATORY_CARE_PROVIDER_SITE_OTHER): Payer: Medicare PPO | Admitting: Family Medicine

## 2019-09-21 ENCOUNTER — Other Ambulatory Visit: Payer: Self-pay

## 2019-09-21 VITALS — BP 140/80 | HR 74 | Temp 98.5°F | Resp 14 | Ht 64.0 in | Wt 193.0 lb

## 2019-09-21 DIAGNOSIS — N1831 Chronic kidney disease, stage 3a: Secondary | ICD-10-CM | POA: Diagnosis not present

## 2019-09-21 DIAGNOSIS — I10 Essential (primary) hypertension: Secondary | ICD-10-CM

## 2019-09-21 DIAGNOSIS — H9190 Unspecified hearing loss, unspecified ear: Secondary | ICD-10-CM

## 2019-09-21 DIAGNOSIS — Z79899 Other long term (current) drug therapy: Secondary | ICD-10-CM | POA: Diagnosis not present

## 2019-09-21 DIAGNOSIS — R7301 Impaired fasting glucose: Secondary | ICD-10-CM

## 2019-09-21 DIAGNOSIS — J4520 Mild intermittent asthma, uncomplicated: Secondary | ICD-10-CM

## 2019-09-21 LAB — POCT GLYCOSYLATED HEMOGLOBIN (HGB A1C): Hemoglobin A1C: 6.1 % — AB (ref 4.0–5.6)

## 2019-09-21 NOTE — Patient Instructions (Signed)
Please return in early October 2021 for your annual complete physical; please come fasting.  I'm glad you are vaccinated and life will look a little more normal for you and your family.  No medication changes today.   I have placed a referral for you to get your hearing evaluated. We will call you to get that scheduled. IF you find out about the [facility], options you may cancel.  If you have any questions or concerns, please don't hesitate to send me a message via MyChart or call the office at [phone]. Thank you for visiting with Sabrina Ramirez today! It's our pleasure caring for you.   Hearing Loss Hearing loss is a partial or total loss of the ability to hear. This can be temporary or permanent, and it can happen in one or both ears. Medical care is necessary to treat hearing loss properly and to prevent the condition from getting worse. Your hearing may partially or completely come back, depending on what caused your hearing loss and how severe it is. In some cases, hearing loss is permanent. What are the causes? Common causes of hearing loss include:  Too much wax in the ear canal.  Infection of the ear canal or middle ear.  Fluid in the middle ear.  Injury to the ear or surrounding area.  An object stuck in the ear.  A history of prolonged exposure to loud sounds, such as music. Less common causes of hearing loss include:  Tumors in the ear.  Viral or bacterial infections, such as meningitis.  A hole in the eardrum (perforated eardrum).  Problems with the hearing nerve that sends signals between the brain and the ear.  Certain medicines. What are the signs or symptoms? Symptoms of this condition may include:  Difficulty telling the difference between sounds.  Difficulty following a conversation when there is background noise.  Lack of response to sounds in your environment. This may be most noticeable when you do not respond to startling sounds.  Needing to turn up the  volume on the television, radio, or other devices.  Ringing in the ears.  Dizziness. How is this diagnosed? This condition is diagnosed based on:  A physical exam.  A hearing test (audiometry). The audiometry test will be performed by a hearing specialist (audiologist). You may also be referred to an ear, nose, and throat (ENT) specialist (otolaryngologist). How is this treated? Treatment for hearing loss may include:  Ear wax removal.  Medicines to treat or prevent infection (antibiotics).  Medicines to reduce inflammation (corticosteroids).  Hearing aids for hearing loss related to nerve damage. Follow these instructions at home:  If you were prescribed an antibiotic medicine, take it as told by your health care provider. Do not stop taking the antibiotic even if you start to feel better.  Take over-the-counter and prescription medicines only as told by your health care provider.  Avoid loud noises.  Return to your normal activities as told by your health care provider. Ask your health care provider what activities are safe for you.  Keep all follow-up visits as told by your health care provider. This is important. Contact a health care provider if:  You feel dizzy.  You develop new symptoms.  You vomit or feel nauseous.  You have a fever. Get help right away if:  You develop sudden changes in your vision.  You have severe ear pain.  You have new or increased weakness.  You have a severe headache. Summary  Hearing loss is  a decreased ability to hear sounds around you. It can be temporary or permanent.  Treatment will depend on the cause of your hearing loss. It may include ear wax removal, medicines, or a hearing aid.  Your hearing may partially or completely come back, depending on what caused your hearing loss and how severe it is.  Keep all follow-up visits as told by your health care provider. This is important. This information is not intended to  replace advice given to you by your health care provider. Make sure you discuss any questions you have with your health care provider. Document Revised: 02/23/2018 Document Reviewed: 02/23/2018 Elsevier Patient Education  [place].

## 2019-09-21 NOTE — Progress Notes (Signed)
Subjective  CC:  Chief Complaint  Patient presents with  . Hypertension    not checking BP at home, denies any swelling or issues    HPI: Sabrina Ramirez is a 79 y.o. female who presents to the office today to address the problems listed above in the chief complaint.  Hypertension f/u: Control is fair . Pt reports she is doing well. taking medications as instructed, no medication side effects noted, no TIAs, no chest pain on exertion, no dyspnea on exertion, no swelling of ankles. bp is up a little today due to her son having an accident at home: fortunately no one was hurt but it did shake everyone up.  She denies adverse effects from his BP medications. Compliance with medication is good.   IFG: sedentary mainly due to covid but eating fair. No sxs of hyperglycemia.   CKD: no LE edema. No n/v or sob.   Asthma: no exacerbations over the winter months. Denies wheezing or cough or need for albuterol resuce.   [place]iges: acitve  C/o bilateral hearing loss; notices it mostly on the phone or with the sound on TV. No ear pain or trauma  Lab Results  Component Value Date   HGBA1C 6.1 (A) 09/21/2019   HGBA1C 6.3 03/09/2019     Hearing Screening   125Hz  250Hz  500Hz  1000Hz  2000Hz  3000Hz  4000Hz  6000Hz  8000Hz   Right ear:   Pass Pass Pass  Fail    Left ear:   Pass Pass Pass  Fail       Assessment  1. IFG (impaired fasting glucose)   2. Benign essential hypertension   3. Stage 3a chronic kidney disease   4. On statin therapy due to risk of future cardiovascular event   5. Allergic asthma, mild intermittent, uncomplicated   6. Hearing loss, unspecified hearing loss type, unspecified laterality      Plan    Hypertension f/u: BP control is fairly well controlled. Suspect mildly worse today due to anxiety. No change in meds.   Prediabetes f/u: educated on diet. Mildly improving. Will monitor.   CKD and asthma are stable.   Hearing loss, high frequency and symptomatic. Refer to  audiology. Treat allergies  Education regarding management of these chronic disease states was given. Management strategies discussed on successive visits include dietary and exercise recommendations, goals of achieving and maintaining IBW, and lifestyle modifications aiming for adequate sleep and minimizing stressors.   Follow up: Return in about 6 months (around 03/22/2020) for complete physical.  Orders Placed This Encounter  Procedures  . Ambulatory referral to Audiology  . POCT HgB A1C   No orders of the defined types were placed in this encounter.     BP Readings from Last 3 Encounters:  09/21/19 140/80  03/08/19 140/72  12/29/18 126/70   Wt Readings from Last 3 Encounters:  09/21/19 193 lb (87.5 kg)  03/08/19 195 lb 6.4 oz (88.6 kg)  12/29/18 192 lb 9.6 oz (87.4 kg)    Lab Results  Component Value Date   CHOL 124 03/08/2019   CHOL 136 03/01/2018   CHOL 190 12/01/2017   Lab Results  Component Value Date   HDL 41.80 03/08/2019   HDL 49.20 03/01/2018   HDL 48.10 12/01/2017   Lab Results  Component Value Date   LDLCALC 50 03/08/2019   LDLCALC 52 03/01/2018   LDLCALC 111 (H) 12/01/2017   Lab Results  Component Value Date   TRIG 159.0 (H) 03/08/2019   TRIG 178.0 (H) 03/01/2018  TRIG 158.0 (H) 12/01/2017   Lab Results  Component Value Date   CHOLHDL 3 03/08/2019   CHOLHDL 3 03/01/2018   CHOLHDL 4 12/01/2017   No results found for: LDLDIRECT Lab Results  Component Value Date   CREATININE 1.85 (H) 03/08/2019   BUN 29 (H) 03/08/2019   NA 143 03/08/2019   K 4.2 03/08/2019   CL 109 03/08/2019   CO2 24 03/08/2019    The ASCVD Risk score (Goff DC Jr., et al., 2013) failed to calculate for the following reasons:   The valid total cholesterol range is 130 to 320 mg/dL  I reviewed the patients updated PMH, FH, and SocHx.    Patient Active Problem List   Diagnosis Date Noted  . IFG (impaired fasting glucose) 03/09/2019    Priority: High  . Obesity,  Class I, BMI 30-34.9 04/16/2015    Priority: High  . Hypertriglyceridemia 09/22/2011    Priority: High  . Benign essential hypertension 05/23/2010    Priority: High  . CKD (chronic kidney disease) stage 3, GFR 30-59 ml/min 05/23/2010    Priority: High  . Localized primary osteoarthritis of carpometacarpal joint of left thumb 03/08/2019    Priority: Medium  . On statin therapy due to risk of future cardiovascular event 12/01/2017    Priority: Medium  . Spondylolisthesis at L5-S1 level 08/13/2017    Priority: Medium  . Colon polyp 05/25/2017    Priority: Medium  . Ophthalmic migraine 05/25/2017    Priority: Medium  . Essential tremor 05/25/2017    Priority: Medium  . Allergic asthma, mild intermittent, uncomplicated 02/58/5277    Priority: Medium  . Irritable bowel syndrome with diarrhea 07/08/2010    Priority: Medium  . Chronic allergic rhinitis 02/25/2010    Priority: Medium  . GERD (gastroesophageal reflux disease) 02/25/2010    Priority: Medium  . Osteoarthritis, multiple sites 06/12/2009    Priority: Medium  . Conjunctivitis, allergic, chronic 10/01/2012    Priority: Low  . Rosacea 06/25/2011    Priority: Low  . Psoriasis 02/25/2010    Priority: Low    Allergies: Amlodipine, Flexeril [cyclobenzaprine], Ace inhibitors, Adhesive [tape], Angiotensin receptor blockers, Corn-containing products, Iodine, and Triamterene-hctz  Social History: Patient  reports that she has never smoked. She has never used smokeless tobacco. She reports current alcohol use. She reports that she does not use drugs.  Current Meds  Medication Sig  . Albuterol Sulfate (PROAIR RESPICLICK) 824 (90 Base) MCG/ACT AEPB Inhale 1-2 puffs into the lungs every 6 (six) hours as needed (for wheezing/shortness of breath).   Marland Kitchen aspirin EC 81 MG tablet Take 1 tablet (81 mg total) by mouth at bedtime.  Marland Kitchen azelastine (ASTELIN) 0.1 % nasal spray Place 1 spray into the nose daily as needed (for allergies.).   Marland Kitchen  cetirizine (ZYRTEC) 10 MG tablet Take 10 mg by mouth daily.  . Cholecalciferol (VITAMIN D3) 2000 units TABS Take 2,000 Units by mouth daily.  Marland Kitchen CINNAMON PO Take 2.35 applicators by mouth daily. 1/4 teaspoon in coffee grounds  . fluocinonide cream (LIDEX) 0.05 % APPLY SPARINGLY TO AFFECTED AREA THREE TIMES DAILY FOR PSORIASIS FLARES. DO NOT USE LONGER THAN 2 WEEKS  . fluticasone furoate-vilanterol (BREO ELLIPTA) 200-25 MCG/INH AEPB Inhale 1 puff into the lungs daily.  . Glucosamine-Chondroitin (COSAMIN DS PO) Take 2 tablets by mouth daily.  . metoprolol succinate (TOPROL-XL) 100 MG 24 hr tablet Take 1 tablet by mouth once daily  . montelukast (SINGULAIR) 10 MG tablet Take 10 mg by mouth  at bedtime.   . Multiple Vitamin (MULTIVITAMIN) tablet Take 1 tablet by mouth daily. Senior Multivitamin  . omeprazole (PRILOSEC) 20 MG capsule Take 20 mg by mouth daily.  . rosuvastatin (CRESTOR) 5 MG tablet TAKE 1/2 (ONE-HALF) TABLET BY MOUTH AT BEDTIME  . triamcinolone cream (KENALOG) 0.1 % Apply 1 application topically 2 (two) times daily as needed (for skin rash (Summertime)).   . Turmeric POWD Take by mouth.    Review of Systems: Cardiovascular: negative for chest pain, palpitations, leg swelling, orthopnea Respiratory: negative for SOB, wheezing or persistent cough Gastrointestinal: negative for abdominal pain Genitourinary: negative for dysuria or gross hematuria  Objective  Vitals: BP 140/80   Pulse 74   Temp 98.5 F (36.9 C) (Temporal)   Resp 14   Ht 5\' 4"  (1.626 m)   Wt 193 lb (87.5 kg)   SpO2 97%   BMI 33.13 kg/m  General: no acute distress  Psych:  Alert and oriented, normal mood and affect HEENT:  Normocephalic, atraumatic, supple neck , TMs nl bilaterally no cerumen Cardiovascular:  RRR without murmur. no edema Respiratory:  Good breath sounds bilaterally, CTAB with normal respiratory effort Skin:  Warm, no rashes Neurologic:   Mental status is normal  Commons side effects, risks,  benefits, and alternatives for medications and treatment plan prescribed today were discussed, and the patient expressed understanding of the given instructions. Patient is instructed to call or message via MyChart if he/she has any questions or concerns regarding our treatment plan. No barriers to understanding were identified. We discussed Red Flag symptoms and signs in detail. Patient expressed understanding regarding what to do in case of urgent or emergency type symptoms.   Medication list was reconciled, printed and provided to the patient in AVS. Patient instructions and summary information was reviewed with the patient as documented in the AVS. This note was prepared with assistance of Dragon voice recognition software. Occasional wrong-word or sound-a-like substitutions may have occurred due to the inherent limitations of voice recognition software  This visit occurred during the SARS-CoV-2 public health emergency.  Safety protocols were in place, including screening questions prior to the visit, additional usage of staff PPE, and extensive cleaning of exam room while observing appropriate contact time as indicated for disinfecting solutions.

## 2019-11-14 DIAGNOSIS — H9313 Tinnitus, bilateral: Secondary | ICD-10-CM | POA: Diagnosis not present

## 2019-11-14 DIAGNOSIS — H903 Sensorineural hearing loss, bilateral: Secondary | ICD-10-CM | POA: Diagnosis not present

## 2019-11-26 ENCOUNTER — Other Ambulatory Visit: Payer: Self-pay | Admitting: Family Medicine

## 2020-02-01 ENCOUNTER — Other Ambulatory Visit: Payer: Self-pay | Admitting: Critical Care Medicine

## 2020-02-01 ENCOUNTER — Telehealth: Payer: Self-pay

## 2020-02-01 ENCOUNTER — Other Ambulatory Visit: Payer: Medicare PPO

## 2020-02-01 DIAGNOSIS — Z20822 Contact with and (suspected) exposure to covid-19: Secondary | ICD-10-CM

## 2020-02-01 NOTE — Telephone Encounter (Signed)
Patient advise that request has been sent to merged her charts

## 2020-02-03 ENCOUNTER — Other Ambulatory Visit: Payer: Medicare PPO

## 2020-02-03 LAB — NOVEL CORONAVIRUS, NAA: SARS-CoV-2, NAA: NOT DETECTED

## 2020-02-03 LAB — SARS-COV-2, NAA 2 DAY TAT

## 2020-02-29 ENCOUNTER — Other Ambulatory Visit: Payer: Self-pay | Admitting: Family Medicine

## 2020-02-29 NOTE — Telephone Encounter (Signed)
Patient scheduled for 10/19.

## 2020-02-29 NOTE — Telephone Encounter (Signed)
Pt is due for cpe in October but has not yet scheduled. Please call and schedule her for f/u of HTN, and chronic medical problems in October. meds refilled. Thanks.

## 2020-03-23 ENCOUNTER — Ambulatory Visit (INDEPENDENT_AMBULATORY_CARE_PROVIDER_SITE_OTHER): Payer: Medicare PPO

## 2020-03-23 ENCOUNTER — Other Ambulatory Visit: Payer: Self-pay

## 2020-03-23 DIAGNOSIS — Z0000 Encounter for general adult medical examination without abnormal findings: Secondary | ICD-10-CM

## 2020-03-23 NOTE — Progress Notes (Signed)
Virtual Visit via Telephone Note  I connected with  Sabrina Ramirez on 03/23/20 at 11:00 AM EDT by telephone and verified that I am speaking with the correct person using two identifiers.  Medicare Annual Wellness visit completed telephonically due to Covid-19 pandemic.   Persons participating in this call: This Health Coach and this patient.   Location: Patient: Home Provider: Office   I discussed the limitations, risks, security and privacy concerns of performing an evaluation and management service by telephone and the availability of in person appointments. The patient expressed understanding and agreed to proceed.  Unable to perform video visit due to video visit attempted and failed and/or patient does not have video capability.   Some vital signs may be absent or patient reported.   Willette Brace, LPN    Subjective:   Sabrina Ramirez is a 79 y.o. female who presents for Medicare Annual (Subsequent) preventive examination.  Review of Systems     Cardiac Risk Factors include: advanced age (>2men, >2 women);dyslipidemia;hypertension;obesity (BMI >30kg/m2)     Objective:    There were no vitals filed for this visit. There is no height or weight on file to calculate BMI.  Advanced Directives 03/11/2019 01/26/2019 02/01/2018 10/28/2017 08/06/2017 10/11/2016  Does Patient Have a Medical Advance Directive? Yes No Yes Yes Yes Yes  Type of Advance Directive Living will;Healthcare Power of [place];Living will [place];Living will [place];Living will Living will;Healthcare Power of Attorney  Does patient want to make changes to medical advance directive? No - Patient declined - No - Patient declined - No - Patient declined -  Copy of [place] in Chart? No - copy requested - No - copy requested No - copy requested No - copy requested -  Would patient like information on creating a medical  advance directive? - No - Patient declined No - Patient declined - - -    Current Medications (verified) Outpatient Encounter Medications as of 03/23/2020  Medication Sig  . Albuterol Sulfate (PROAIR RESPICLICK) 409 (90 Base) MCG/ACT AEPB Inhale 1-2 puffs into the lungs every 6 (six) hours as needed (for wheezing/shortness of breath).   Marland Kitchen aspirin EC 81 MG tablet Take 1 tablet (81 mg total) by mouth at bedtime.  Marland Kitchen azelastine (ASTELIN) 0.1 % nasal spray Place 1 spray into the nose daily as needed (for allergies.).   Marland Kitchen cetirizine (ZYRTEC) 10 MG tablet Take 10 mg by mouth daily.  . Cholecalciferol (VITAMIN D3) 2000 units TABS Take 2,000 Units by mouth daily.  Marland Kitchen CINNAMON PO Take 8.11 applicators by mouth daily. 1/4 teaspoon in coffee grounds  . fluocinonide cream (LIDEX) 0.05 % APPLY SPARINGLY TO AFFECTED AREA THREE TIMES DAILY FOR PSORIASIS FLARES. DO NOT USE LONGER THAN 2 WEEKS  . fluticasone furoate-vilanterol (BREO ELLIPTA) 200-25 MCG/INH AEPB Inhale 1 puff into the lungs daily.  . Glucosamine-Chondroitin (COSAMIN DS PO) Take 2 tablets by mouth daily.  . Guaifenesin 1200 MG TB12 Take 1 tablet by mouth as needed.  . metoprolol succinate (TOPROL-XL) 100 MG 24 hr tablet Take 1 tablet by mouth once daily  . montelukast (SINGULAIR) 10 MG tablet Take 10 mg by mouth at bedtime.   . Multiple Vitamin (MULTIVITAMIN) tablet Take 1 tablet by mouth daily. Senior Multivitamin  . omeprazole (PRILOSEC) 20 MG capsule Take 20 mg by mouth daily.  . rosuvastatin (CRESTOR) 5 MG tablet TAKE 1/2 (ONE-HALF) TABLET BY MOUTH AT BEDTIME  .  triamcinolone cream (KENALOG) 0.1 % Apply 1 application topically 2 (two) times daily as needed (for skin rash (Summertime)).   . Turmeric POWD Take by mouth.  . diclofenac sodium (VOLTAREN) 1 % GEL Apply 2 g topically 4 (four) times daily. (Patient not taking: Reported on 09/21/2019)   No facility-administered encounter medications on file as of 03/23/2020.    Allergies  (verified) Amlodipine, Flexeril [cyclobenzaprine], Ace inhibitors, Adhesive [tape], Angiotensin receptor blockers, Corn-containing products, Iodine, and Triamterene-hctz   History: Past Medical History:  Diagnosis Date  . Allergic asthma, mild intermittent, uncomplicated 96/09/5407   Dr. Donneta Romberg, allergist  . Arthritis   . Chronic kidney disease    Stage III kidney disease  . GERD (gastroesophageal reflux disease)   . Hemorrhoids   . History of kidney stones   . Hypertension   . IBS (irritable bowel syndrome)   . IFG (impaired fasting glucose) 03/09/2019   a1c 6.3 02/2019  . Psoriasis    Past Surgical History:  Procedure Laterality Date  . ABDOMINAL HYSTERECTOMY  1987  . CATARACT EXTRACTION W/ INTRAOCULAR LENS  IMPLANT, BILATERAL Bilateral 2015  . CHOLECYSTECTOMY    . HEMORRHOID SURGERY    . SPINE SURGERY  07/10/2017  . TONSILLECTOMY     Family History  Problem Relation Age of Onset  . Stroke Mother   . Diabetes Father   . Heart attack Father   . Heart disease Father   . Hyperlipidemia Father   . Hypertension Father   . Stroke Father   . Diabetes Sister   . Hypertension Sister   . Transient ischemic attack Sister   . COPD Maternal Grandmother   . Heart disease Maternal Grandmother   . Deep vein thrombosis Maternal Grandfather   . Stroke Maternal Grandfather   . Deep vein thrombosis Paternal Grandmother   . Hypertension Paternal Grandmother   . Hypertension Daughter   . Arthritis Maternal Aunt   . COPD Maternal Aunt   . Hypertension Maternal Aunt   . Diabetes Paternal Uncle   . Heart disease Paternal Uncle   . Stroke Paternal Uncle   . Breast cancer Neg Hx    Social History   Socioeconomic History  . Marital status: Divorced    Spouse name: Not on file  . Number of children: Not on file  . Years of education: Not on file  . Highest education level: Not on file  Occupational History  . Occupation: Retired  Tobacco Use  . Smoking status: Never Smoker  .  Smokeless tobacco: Never Used  Vaping Use  . Vaping Use: Never used  Substance and Sexual Activity  . Alcohol use: Yes    Comment: occassionally  . Drug use: No  . Sexual activity: Never  Other Topics Concern  . Not on file  Social History Narrative  . Not on file   Social Determinants of Health   Financial Resource Strain: Low Risk   . Difficulty of Paying Living Expenses: Not hard at all  Food Insecurity: No Food Insecurity  . Worried About Charity fundraiser in the Last Year: Never true  . Ran Out of Food in the Last Year: Never true  Transportation Needs: No Transportation Needs  . Lack of Transportation (Medical): No  . Lack of Transportation (Non-Medical): No  Physical Activity: Inactive  . Days of Exercise per Week: 0 days  . Minutes of Exercise per Session: 0 min  Stress: Stress Concern Present  . Feeling of Stress : To some extent  Social Connections: Moderately Isolated  . Frequency of Communication with Friends and Family: More than three times a week  . Frequency of Social Gatherings with Friends and Family: Once a week  . Attends Religious Services: More than 4 times per year  . Active Member of Clubs or Organizations: No  . Attends Archivist Meetings: Never  . Marital Status: Divorced    Tobacco Counseling Counseling given: Not Answered   Clinical Intake:  Pre-visit preparation completed: Yes  Pain : No/denies pain     BMI - recorded: 33.13 Nutritional Status: BMI > 30  Obese Nutritional Risks: Nausea/ vomitting/ diarrhea (loose stool x2 today) Diabetes: No  How often do you need to have someone help you when you read instructions, pamphlets, or other written materials from your doctor or pharmacy?: 1 - Never  Diabetic?No  Interpreter Needed?: No  Information entered by :: Charlott Rakes, LPN   Activities of Daily Living In your present state of health, do you have any difficulty performing the following activities: 03/23/2020  09/21/2019  Hearing? Y N  Comment mild loss looking to get hearing aids -  Vision? N N  Difficulty concentrating or making decisions? N N  Walking or climbing stairs? N N  Dressing or bathing? N N  Doing errands, shopping? N N  Preparing Food and eating ? N -  Using the Toilet? N -  In the past six months, have you accidently leaked urine? N -  Do you have problems with loss of bowel control? Y -  Comment loose stool x 2 today, Has immoduim for relief -  Managing your Medications? N -  Managing your Finances? N -  Housekeeping or managing your Housekeeping? N -  Some recent data might be hidden    Patient Care Team: Leamon Arnt, MD as PCP - General (Family Medicine) Mosetta Anis, MD as Referring Physician (Allergy) Consuella Lose, MD as Consulting Physician (Neurosurgery) Mottinger, Sharyn Lull (Dentistry)  Indicate any recent Medical Services you may have received from other than [facility] providers in the past year (date may be approximate).     Assessment:   This is a routine wellness examination for [place].  Hearing/Vision screen  Hearing Screening   125Hz  250Hz  500Hz  1000Hz  2000Hz  3000Hz  4000Hz  6000Hz  8000Hz   Right ear:           Left ear:           Comments: Pt stated Hearing loss and has seen a audiologist for possible hearing aids   Vision Screening Comments: Follows Up annually with Juanito Doom for eye exams  Dietary issues and exercise activities discussed: Current Exercise Habits: The patient does not participate in regular exercise at present  Goals    . Patient Stated     Maintain current health by increasing activity.     . Patient Stated     Walk more      Depression Screen PHQ 2/9 Scores 03/23/2020 03/08/2019 10/19/2018 10/28/2017  PHQ - 2 Score 1 2 2  0  PHQ- 9 Score - 6 4 -    Fall Risk Fall Risk  03/23/2020 09/21/2019 03/11/2019 12/29/2018 10/19/2018  Falls in the past year? 0 0 0 0 0  Number falls in past yr: 0 0 0 0 0  Injury with  Fall? 0 0 0 0 0  Risk for fall due to : Impaired vision;Impaired mobility;Impaired balance/gait - Impaired mobility - -  Follow up Falls prevention discussed - Education provided;Falls evaluation completed;Falls prevention discussed  Falls evaluation completed Falls evaluation completed    Any stairs in or around the home? Yes  If so, are there any without handrails? No  Home free of loose throw rugs in walkways, pet beds, electrical cords, etc? Yes  Adequate lighting in your home to reduce risk of falls? Yes   ASSISTIVE DEVICES UTILIZED TO PREVENT FALLS:  Life alert? No  Use of a cane, walker or w/c? Yes  Grab bars in the bathroom? No  Shower chair or bench in shower? Yes  Elevated toilet seat or a handicapped toilet? Yes   TIMED UP AND GO:  Was the test performed? No .      Cognitive Function:     6CIT Screen 03/23/2020  What Year? 0 points  What month? 0 points  Count back from 20 0 points  Months in reverse 0 points  Repeat phrase 0 points    Immunizations Immunization History  Administered Date(s) Administered  . Fluad Quad(high Dose 65+) 03/08/2019  . Influenza, High Dose Seasonal PF 03/02/2012, 04/06/2013, 04/21/2014, 04/16/2015, 03/06/2016, 04/09/2017, 03/01/2018  . Influenza-Unspecified 04/12/2011  . PFIZER SARS-COV-2 Vaccination 07/02/2019, 07/23/2019  . Pneumococcal Conjugate-13 10/05/2014  . Pneumococcal Polysaccharide-23 04/23/2010  . Pneumococcal-Unspecified 04/23/2010  . Tdap 05/23/2010  . Zoster 06/09/2004  . Zoster Recombinat (Shingrix) 11/18/2017, 03/23/2018    TDAP status: Up to date Flu Vaccine status: Declined, Education has been provided regarding the importance of this vaccine but patient still declined. Advised may receive this vaccine at local pharmacy or Health Dept. Aware to provide a copy of the vaccination record if obtained from local pharmacy or Health Dept. Verbalized acceptance and understanding. Pneumococcal vaccine status: Up to  date Covid-19 vaccine status: Completed vaccines  Qualifies for Shingles Vaccine? Yes   Zostavax completed Yes   Shingrix Completed?: Yes  Screening Tests Health Maintenance  Topic Date Due  . Hepatitis C Screening  Never done  . INFLUENZA VACCINE  01/08/2020  . TETANUS/TDAP  05/23/2020  . DEXA SCAN  04/28/2023  . COVID-19 Vaccine  Completed  . PNA vac Low Risk Adult  Completed    Health Maintenance  Health Maintenance Due  Topic Date Due  . Hepatitis C Screening  Never done  . INFLUENZA VACCINE  01/08/2020    Colorectal cancer screening: No longer required.  Mammogram status: Completed 09/14/19. Repeat every year Bone Density status: Completed 04/27/18. Results reflect: Bone density results: NORMAL. Repeat every 3-5 years.  Additional Screening:  Hepatitis C Screening: does qualify;   Vision Screening: Recommended annual ophthalmology exams for early detection of glaucoma and other disorders of the eye. Is the patient up to date with their annual eye exam?  Yes  Who is the provider or what is the name of the office in which the patient attends annual eye exams? Dr Kathrin Penner  Dental Screening: Recommended annual dental exams for proper oral hygiene  Community Resource Referral / Chronic Care Management: CRR required this visit?  No   CCM required this visit?  No      Plan:     I have personally reviewed and noted the following in the patient's chart:   . Medical and social history . Use of alcohol, tobacco or illicit drugs  . Current medications and supplements . Functional ability and status . Nutritional status . Physical activity . Advanced directives . List of other physicians . Hospitalizations, surgeries, and ER visits in previous 12 months . Vitals . Screenings to include cognitive, depression, and falls . Referrals and  appointments  In addition, I have reviewed and discussed with patient certain preventive protocols, quality metrics, and best  practice recommendations. A written personalized care plan for preventive services as well as general preventive health recommendations were provided to patient.     Willette Brace, LPN   [id number]   Nurse Notes: Pt would like Flu vaccine at visit 03/27/20

## 2020-03-23 NOTE — Patient Instructions (Signed)
Sabrina Ramirez , Thank you for taking time to come for your Medicare Wellness Visit. I appreciate your ongoing commitment to your health goals. Please review the following plan we discussed and let me know if I can assist you in the future.   Screening recommendations/referral Colonoscopy: No longer required  Mammogram: Done 09/14/19 Bone Density: Done 04/27/18 Recommended yearly ophthalmology/optometry visit for glaucoma screening and checkup Recommended yearly dental visit for hygiene and checkup  Vaccinations: Influenza vaccine: Will receive at 03/27/20 appt Pneumococcal vaccine: Up to date Tdap vaccine: Up to date Shingles vaccine: Completed 11/18/17, 03/23/18   Covid-19:Completed 07/02/19 & 07/23/19  Advanced directives: Please bring a copy of your health care power of attorney and living will to the office at your convenience.  Conditions/risks identified: walk more   Next appointment: Follow up in one year for your annual wellness visit   Preventive Care 65 Years and Older, Female Preventive care refers to lifestyle choices and visits with your health care provider that can promote health and wellness. What does preventive care include?  A yearly physical exam. This is also called an annual well check.  Dental exams once or twice a year.  Routine eye exams. Ask your health care provider how often you should have your eyes checked.  Personal lifestyle choices, including:  Daily care of your teeth and gums.  Regular physical activity.  Eating a healthy diet.  Avoiding tobacco and drug use.  Limiting alcohol use.  Practicing safe sex.  Taking low-dose aspirin every [place].  Taking vitamin and mineral supplements as recommended by your health care provider. What happens during an annual well check? The services and screenings done by your health care provider during your annual well check will depend on your age, overall health, lifestyle risk factors, and family history of  disease. Counseling  Your health care provider may ask you questions about your:  Alcohol use.  Tobacco use.  Drug use.  Emotional well-being.  Home and relationship well-being.  Sexual activity.  Eating habits.  History of falls.  Memory and ability to understand (cognition).  Work and work Statistician.  Reproductive health. Screening  You may have the following tests or measurements:  Height, weight, and BMI.  Blood pressure.  Lipid and cholesterol levels. These may be checked every 5 years, or more frequently if you are over 61 years old.  Skin check.  Lung cancer screening. You may have this screening every year starting at age 23 if you have a 30-pack-year history of smoking and currently smoke or have quit within the past 15 years.  Fecal occult blood test (FOBT) of the stool. You may have this test every year starting at age 3.  Flexible sigmoidoscopy or colonoscopy. You may have a sigmoidoscopy every 5 years or a colonoscopy every 10 years starting at age 59.  Hepatitis C blood test.  Hepatitis B blood test.  Sexually transmitted disease (STD) testing.  Diabetes screening. This is done by checking your blood sugar (glucose) after you have not eaten for a while (fasting). You may have this done every 1-3 years.  Bone density scan. This is done to screen for osteoporosis. You may have this done starting at age 61.  Mammogram. This may be done every 1-2 years. Talk to your health care provider about how often you should have regular mammograms. Talk with your health care provider about your test results, treatment options, and if necessary, the need for more tests. Vaccines  Your health care provider  may recommend certain vaccines, such as:  Influenza vaccine. This is recommended every year.  Tetanus, diphtheria, and acellular pertussis (Tdap, Td) vaccine. You may need a Td booster every 10 years.  Zoster vaccine. You may need this after age  83.  Pneumococcal 13-valent conjugate (PCV13) vaccine. One dose is recommended after age 14.  Pneumococcal polysaccharide (PPSV23) vaccine. One dose is recommended after age 78. Talk to your health care provider about which screenings and vaccines you need and how often you need them. This information is not intended to replace advice given to you by your health care provider. Make sure you discuss any questions you have with your health care provider. Document Released: 06/22/2015 Document Revised: 02/13/2016 Document Reviewed: 03/27/2015 Elsevier Interactive Patient Education  2017 [place] Prevention in the Home Falls can cause injuries. They can happen to people of all ages. There are many things you can do to make your home safe and to help prevent falls. What can I do on the outside of my home?  Regularly fix the edges of walkways and driveways and fix any cracks.  Remove anything that might make you trip as you walk through a door, such as a raised step or threshold.  Trim any bushes or trees on the path to your home.  Use bright outdoor lighting.  Clear any walking paths of anything that might make someone trip, such as rocks or tools.  Regularly check to see if handrails are loose or broken. Make sure that both sides of any steps have handrails.  Any raised decks and porches should have guardrails on the edges.  Have any leaves, snow, or ice cleared regularly.  Use sand or salt on walking paths during winter.  Clean up any spills in your garage right away. This includes oil or grease spills. What can I do in the bathroom?  Use night lights.  Install grab bars by the toilet and in the tub and shower. Do not use towel bars as grab bars.  Use non-skid mats or decals in the tub or shower.  If you need to sit down in the shower, use a plastic, non-slip stool.  Keep the floor dry. Clean up any water that spills on the floor as soon as it happens.  Remove  soap buildup in the tub or shower regularly.  Attach bath mats securely with double-sided non-slip rug tape.  Do not have throw rugs and other things on the floor that can make you trip. What can I do in the bedroom?  Use night lights.  Make sure that you have a light by your bed that is easy to reach.  Do not use any sheets or blankets that are too big for your bed. They should not hang down onto the floor.  Have a firm chair that has side arms. You can use this for support while you get dressed.  Do not have throw rugs and other things on the floor that can make you trip. What can I do in the kitchen?  Clean up any spills right away.  Avoid walking on wet floors.  Keep items that you use a lot in easy-to-reach places.  If you need to reach something above you, use a strong step stool that has a grab bar.  Keep electrical cords out of the way.  Do not use floor polish or wax that makes floors slippery. If you must use wax, use non-skid floor wax.  Do not have throw rugs  and other things on the floor that can make you trip. What can I do with my stairs?  Do not leave any items on the stairs.  Make sure that there are handrails on both sides of the stairs and use them. Fix handrails that are broken or loose. Make sure that handrails are as long as the stairways.  Check any carpeting to make sure that it is firmly attached to the stairs. Fix any carpet that is loose or worn.  Avoid having throw rugs at the top or bottom of the stairs. If you do have throw rugs, attach them to the floor with carpet tape.  Make sure that you have a light switch at the top of the stairs and the bottom of the stairs. If you do not have them, ask someone to add them for you. What else can I do to help prevent falls?  Wear shoes that:  Do not have high heels.  Have rubber bottoms.  Are comfortable and fit you well.  Are closed at the toe. Do not wear sandals.  If you use a  stepladder:  Make sure that it is fully opened. Do not climb a closed stepladder.  Make sure that both sides of the stepladder are locked into place.  Ask someone to hold it for you, if possible.  Clearly mark and make sure that you can see:  Any grab bars or handrails.  First and last steps.  Where the edge of each step is.  Use tools that help you move around (mobility aids) if they are needed. These include:  Canes.  Walkers.  Scooters.  Crutches.  Turn on the lights when you go into a dark area. Replace any light bulbs as soon as they burn out.  Set up your furniture so you have a clear path. Avoid moving your furniture around.  If any of your floors are uneven, fix them.  If there are any pets around you, be aware of where they are.  Review your medicines with your doctor. Some medicines can make you feel dizzy. This can increase your chance of falling. Ask your doctor what other things that you can do to help prevent falls. This information is not intended to replace advice given to you by your health care provider. Make sure you discuss any questions you have with your health care provider. Document Released: 03/22/2009 Document Revised: 11/01/2015 Document Reviewed: 06/30/2014 Elsevier Interactive Patient Education  2017 [employer].

## 2020-03-23 NOTE — Progress Notes (Signed)
I have reviewed the documented AWV and agree with the findings as documented by our nurse educator.  I was available for consultation during the visit. I will follow up with patient on any outstanding screens that are needed, results or new concerns that were found on this documented screening visit.   

## 2020-03-27 ENCOUNTER — Ambulatory Visit: Payer: Medicare PPO | Admitting: Family Medicine

## 2020-03-27 ENCOUNTER — Encounter: Payer: Self-pay | Admitting: Family Medicine

## 2020-03-27 ENCOUNTER — Other Ambulatory Visit: Payer: Self-pay

## 2020-03-27 VITALS — BP 142/80 | HR 63 | Temp 97.7°F | Resp 15 | Ht 64.0 in | Wt 192.4 lb

## 2020-03-27 DIAGNOSIS — I10 Essential (primary) hypertension: Secondary | ICD-10-CM

## 2020-03-27 DIAGNOSIS — Z79899 Other long term (current) drug therapy: Secondary | ICD-10-CM

## 2020-03-27 DIAGNOSIS — M8949 Other hypertrophic osteoarthropathy, multiple sites: Secondary | ICD-10-CM

## 2020-03-27 DIAGNOSIS — Z23 Encounter for immunization: Secondary | ICD-10-CM | POA: Diagnosis not present

## 2020-03-27 DIAGNOSIS — M159 Polyosteoarthritis, unspecified: Secondary | ICD-10-CM

## 2020-03-27 DIAGNOSIS — M4317 Spondylolisthesis, lumbosacral region: Secondary | ICD-10-CM

## 2020-03-27 DIAGNOSIS — Z0000 Encounter for general adult medical examination without abnormal findings: Secondary | ICD-10-CM

## 2020-03-27 DIAGNOSIS — K580 Irritable bowel syndrome with diarrhea: Secondary | ICD-10-CM

## 2020-03-27 DIAGNOSIS — E781 Pure hyperglyceridemia: Secondary | ICD-10-CM

## 2020-03-27 DIAGNOSIS — R7301 Impaired fasting glucose: Secondary | ICD-10-CM

## 2020-03-27 DIAGNOSIS — N1831 Chronic kidney disease, stage 3a: Secondary | ICD-10-CM | POA: Diagnosis not present

## 2020-03-27 DIAGNOSIS — R252 Cramp and spasm: Secondary | ICD-10-CM

## 2020-03-27 MED ORDER — RIFAXIMIN 550 MG PO TABS: 550.0000 mg | ORAL_TABLET | Freq: Three times a day (TID) | ORAL | 0 refills | Status: AC

## 2020-03-27 MED ORDER — HYDROCHLOROTHIAZIDE 25 MG PO TABS: 12.5000 mg | ORAL_TABLET | Freq: Every day | ORAL | 3 refills | Status: DC

## 2020-03-27 NOTE — Patient Instructions (Signed)
Please return in 6 months to recheck blood pressure. Sooner if remains elevated at home.   I will release your lab results to you on your MyChart account with further instructions. Please reply with any questions.   I have placed a referral to PT at [facility].  Start the new BP pill 1/2 daily in addition to the metoprolol. Start checking bp at home. We want it consistently < 140/90.  Decrease sodium in your diet.   If you have any questions or concerns, please don't hesitate to send me a message via MyChart or call the office at [phone]. Thank you for visiting with [place] today! It's our pleasure caring for you.  We will try to get you a medication for you IBS. Also consider the fodmap diet:  FODMAP stands for "fermentable oligodi-monosaccharides and polyols," or, more simply, certain types of carbohydrates found in foods that are hard to digest. By following FODMAP, also known as a low-FODMAP diet, you avoid or limit these particular carbohydrates. Some of the foods that contain FODMAPs include: - Fruits such as apples, apricots, blackberries, cherries, mango, nectarines, pears, plums, and watermelon, or juice containing any of these fruits - Canned fruit in natural fruit juice, or large quantities of fruit juice or dried fruit - Vegentables such as artichokes, asparagus, beans, cabbage, cauliflower, garlic and garlic salts, lentils, mushrooms, onions, and sugar snap or snow peas - Dairy products such as milk, milk products, soft cheese, yogurt, custard, and ice cream - Wheat and rye products - Honey and foods with high-fructose corn syrup  - Products, including candy and gum, with sweeteners ending in "-ol" (for example, sorbitol, mannitol, xylitol, and maltitol)   My favorite websites for more information is: [employer].co.za   Muscle Cramps and Spasms Muscle cramps and spasms occur when a muscle or muscles tighten and you have no control over this tightening (involuntary  muscle contraction). They are a common problem and can develop in any muscle. The most common place is in the calf muscles of the leg. Muscle cramps and muscle spasms are both involuntary muscle contractions, but there are some differences between the two:  Muscle cramps are painful. They come and go and may last for a few seconds or up to 15 minutes. Muscle cramps are often more forceful and last longer than muscle spasms.  Muscle spasms may or may not be painful. They may also last just a few seconds or much longer. Certain medical conditions, such as diabetes or Parkinson's disease, can make it more likely to develop cramps or spasms. However, cramps or spasms are usually not caused by a serious underlying problem. Common causes include:  Doing more physical work or exercise than your body is ready for (overexertion).  Overuse from repeating certain movements too many times.  Remaining in a certain position for a long period of time.  Improper preparation, form, or technique while playing a sport or doing an activity.  Dehydration.  Injury.  Side effects of some medicines.  Abnormally low levels of the salts and minerals in your blood (electrolytes), especially potassium and calcium. This could happen if you are taking water pills (diuretics) or if you are pregnant. In many cases, the cause of muscle cramps or spasms is not known. Follow these instructions at home: Managing pain and stiffness      Try massaging, stretching, and relaxing the affected muscle. Do this for several minutes at a time.  If directed, apply heat to tight or tense muscles as  often as told by your health care provider. Use the heat source that your health care provider recommends, such as a moist heat pack or a heating pad. ? Place a towel between your skin and the heat source. ? Leave the heat on for 20-30 minutes. ? Remove the heat if your skin turns bright red. This is especially important if you are  unable to feel pain, heat, or cold. You may have a greater risk of getting burned.  If directed, put ice on the affected area. This may help if you are sore or have pain after a cramp or spasm. ? Put ice in a plastic bag. ? Place a towel between your skin and the bag. ? Leavethe ice on for 20 minutes, 2-3 times a [place].  Try taking hot showers or baths to help relax tight muscles. Eating and drinking  Drink enough fluid to keep your urine pale yellow. Staying well hydrated may help prevent cramps or spasms.  Eat a healthy diet that includes plenty of nutrients to help your muscles function. A healthy diet includes fruits and vegetables, lean protein, whole grains, and low-fat or nonfat dairy products. General instructions  If you are having frequent cramps, avoid intense exercise for several days.  Take over-the-counter and prescription medicines only as told by your health care provider.  Pay attention to any changes in your symptoms.  Keep all follow-up visits as told by your health care provider. This is important. Contact a health care provider if:  Your cramps or spasms get more severe or happen more often.  Your cramps or spasms do not improve over time. Summary  Muscle cramps and spasms occur when a muscle or muscles tighten and you have no control over this tightening (involuntary muscle contraction).  The most common place for cramps or spasms to occur is in the calf muscles of the leg.  Massaging, stretching, and relaxing the affected muscle may relieve the cramp or spasm.  Drink enough fluid to keep your urine pale yellow. Staying well hydrated may help prevent cramps or spasms. This information is not intended to replace advice given to you by your health care provider. Make sure you discuss any questions you have with your health care provider. Document Revised: 10/19/2017 Document Reviewed: 10/19/2017 Elsevier Patient Education  [place].

## 2020-03-27 NOTE — Progress Notes (Signed)
Subjective  Chief Complaint  Patient presents with  . Hypertension    forgot BP log at home  . Back Pain    lower back pain   . Irritable Bowel Syndrome    increased diarrhea, 3-4 BM every morning   . Foot Swelling    bilateral feet, left ankle   . RLS    bilateral legs, worsened over the past year - effecting sleep    HPI: Sabrina Ramirez is a 79 y.o. female who presents to [facility] [place] today for a Female Wellness Visit. She also has the concerns and/or needs as listed above in the chief complaint. These will be addressed in addition to the Health Maintenance Visit.   Wellness Visit: annual visit with health maintenance review and exam without Pap   HM: up to date. Reviewed recent AWV. Screens are up to date. Due flu vaccine today Chronic disease f/u and/or acute problem visit: (deemed necessary to be done in addition to the wellness visit):  HTN f/u: Feeling well.  Blood pressure is elevated in the office today.  She thinks this is due to stress from driving on the highway.  However home readings are mildly elevated as well averaging 140s over 80s.  Taking medications w/o adverse effects. No symptoms of CHF, angina; no palpitations, sob, cp or lower extremity edema. Compliant with meds.   Ifg: diet is stable.  No symptoms of hyperglycemia  ckd w.o sob.  Does complain of bilateral foot swelling worse in the evening.  Better after lying down.  No painful calves.  No calf swelling.  Tolerates low-dose statin daily without adverse effects.  Complains of muscle cramps at night.  Mostly in the thigh.  Describes a twitching.  Mild spasm.  Short-lived and intermittent.  Low back pain, chronic.  S/p lumbar surgery 1 to 2 years ago.  Again having worsening symptoms.  Worse with standing upright for prolonged periods of time.  Does better if leaning forward.  No radicular symptoms.  Does have a neurosurgeon.  No bowel or bladder incontinence.  Has had physical  therapy after surgery and felt that was helpful.  Not currently taking any pain medications.  IBS with diarrhea, currently active.  Having daily diarrhea symptoms.  Often multiple times per [place].  Not currently taking any medications for this.  No blood or mucus in the stool.  No significant abdominal pain.  Assessment  1. Annual physical exam   2. Benign essential hypertension   3. Stage 3a chronic kidney disease ([place])   4. IFG (impaired fasting glucose)   5. Hypertriglyceridemia   6. Primary osteoarthritis involving multiple joints   7. On statin therapy due to risk of future cardiovascular event   8. Spondylolisthesis at L5-S1 level   9. Irritable bowel syndrome with diarrhea   10. Muscle cramp   11. Need for immunization against influenza      Plan  Female Wellness Visit:  Age appropriate Health Maintenance and Prevention measures were discussed with patient. Included topics are cancer screening recommendations, ways to keep healthy (see AVS) including dietary and exercise recommendations, regular eye and dental care, use of seat belts, and avoidance of moderate alcohol use and tobacco use.   BMI: discussed patient's BMI and encouraged positive lifestyle modifications to help get to or maintain a target BMI.  HM needs and immunizations were addressed and ordered. See below for orders. See HM and immunization section for updates.  Flu shot today  Routine labs and screening tests ordered including cmp, cbc and lipids where appropriate.  Discussed recommendations regarding Vit D and calcium supplementation (see AVS)  Chronic disease management visit and/or acute problem visit:  Hypertension: Borderline control.  Add low-dose hydrochlorothiazide.  This will also help foot swelling.  Check electrolytes and renal function today.  Recheck blood pressure in 3 months.  Recheck blood work for chronic kidney disease and IFG.  Hyperlipidemia, hypertriglyceridemia tolerating statin.   Recheck today.  Active irritable bowel syndrome with diarrhea: Unclear trigger.  Imodium as needed, trial of Xifaxan if able to get approved.  Trial of probiotic for 1 month.  Low back pain: Chronic and active.  Refer for physical therapy.  No red flag symptoms today.  Muscle cramps: Reassured.  Keep hydrated and stretching exercise discussed   Follow up: No follow-ups on file.  Orders Placed This Encounter  Procedures  . Flu Vaccine QUAD High Dose(Fluad)  . CBC with Differential/Platelet  . COMPLETE METABOLIC PANEL WITH GFR  . Lipid panel  . Hemoglobin A1c  . TSH  . Ambulatory referral to Physical Therapy   Meds ordered this encounter  Medications  . rifaximin (XIFAXAN) 550 MG TABS tablet    Sig: Take 1 tablet (550 mg total) by mouth 3 (three) times daily for 14 days.    Dispense:  42 tablet    Refill:  0  . hydrochlorothiazide (HYDRODIURIL) 25 MG tablet    Sig: Take 0.5 tablets (12.5 mg total) by mouth daily.    Dispense:  90 tablet    Refill:  3      Lifestyle: Body mass index is 33.03 kg/m. Wt Readings from Last 3 Encounters:  03/27/20 192 lb 6.4 oz (87.3 kg)  09/21/19 193 lb (87.5 kg)  03/08/19 195 lb 6.4 oz (88.6 kg)     Patient Active Problem List   Diagnosis Date Noted  . IFG (impaired fasting glucose) 03/09/2019    Priority: High    a1c 6.3 02/2019   . Obesity, Class I, BMI 30-34.9 04/16/2015    Priority: High  . Hypertriglyceridemia 09/22/2011    Priority: High  . Benign essential hypertension 05/23/2010    Priority: High  . CKD (chronic kidney disease) stage 3, GFR 30-59 ml/min (HCC) 05/23/2010    Priority: High    Overview:  Nl SPEP,UPEP and mild cortical thinning on renal ultrasound 2014   . Localized primary osteoarthritis of carpometacarpal joint of left thumb 03/08/2019    Priority: Medium  . On statin therapy due to risk of future cardiovascular event 12/01/2017    Priority: Medium  . Spondylolisthesis at L5-S1 level 08/13/2017     Priority: Medium  . Colon polyp 05/25/2017    Priority: Medium    Overview:  1968, none on subsequent colonoscopy, [facility], last 2012   . Ophthalmic migraine 05/25/2017    Priority: Medium  . Essential tremor 05/25/2017    Priority: Medium  . Allergic asthma, mild intermittent, uncomplicated 35/46/5681    Priority: Medium    Dr. Donneta Romberg, allergist   . Irritable bowel syndrome with diarrhea 07/08/2010    Priority: Medium  . Chronic allergic rhinitis 02/25/2010    Priority: Medium  . GERD (gastroesophageal reflux disease) 02/25/2010    Priority: Medium    Overview:  On chronic PPI since early 2000s. Neg H.pylor   . Osteoarthritis, multiple sites 06/12/2009    Priority: Medium  . Conjunctivitis, allergic, chronic 10/01/2012    Priority: Low  . Rosacea 06/25/2011  Priority: Low  . Psoriasis 02/25/2010    Priority: Low   Health Maintenance  Topic Date Due  . TETANUS/TDAP  05/23/2020  . MAMMOGRAM  09/13/2021  . DEXA SCAN  04/28/2023  . INFLUENZA VACCINE  Completed  . COVID-19 Vaccine  Completed  . PNA vac Low Risk Adult  Completed  . Hepatitis C Screening  Discontinued   Immunization History  Administered Date(s) Administered  . Fluad Quad(high Dose 65+) 03/08/2019, 03/27/2020  . Influenza, High Dose Seasonal PF 03/02/2012, 04/06/2013, 04/21/2014, 04/16/2015, 03/06/2016, 04/09/2017, 03/01/2018  . Influenza-Unspecified 04/12/2011  . PFIZER SARS-COV-2 Vaccination 07/02/2019, 07/23/2019  . Pneumococcal Conjugate-13 10/05/2014  . Pneumococcal Polysaccharide-23 04/23/2010  . Pneumococcal-Unspecified 04/23/2010  . Tdap 05/23/2010  . Zoster 06/09/2004  . Zoster Recombinat (Shingrix) 11/18/2017, 03/23/2018   We updated and reviewed the patient's past history in detail and it is documented below. Allergies: Patient is allergic to amlodipine, flexeril [cyclobenzaprine], ace inhibitors, adhesive [tape], angiotensin receptor blockers, corn-containing products, iodine, and  triamterene-hctz. Past Medical History Patient  has a past medical history of Allergic asthma, mild intermittent, uncomplicated (60/63/0160), Arthritis, Chronic kidney disease, GERD (gastroesophageal reflux disease), Hemorrhoids, History of kidney stones, Hypertension, IBS (irritable bowel syndrome), IFG (impaired fasting glucose) (03/09/2019), and Psoriasis. Past Surgical History Patient  has a past surgical history that includes Cholecystectomy; Hemorrhoid surgery; Cataract extraction w/ intraocular lens  implant, bilateral (Bilateral, 2015); Abdominal hysterectomy (1987); Tonsillectomy; and Spine surgery (07/10/2017). Family History: Patient family history includes Arthritis in her maternal aunt; COPD in her maternal aunt and maternal grandmother; Deep vein thrombosis in her maternal grandfather and paternal grandmother; Diabetes in her father, paternal uncle, and sister; Heart attack in her father; Heart disease in her father, maternal grandmother, and paternal uncle; Hyperlipidemia in her father; Hypertension in her daughter, father, maternal aunt, paternal grandmother, and sister; Stroke in her father, maternal grandfather, mother, and paternal uncle; Transient ischemic attack in her sister. Social History:  Patient  reports that she has never smoked. She has never used smokeless tobacco. She reports current alcohol use. She reports that she does not use drugs.  Review of Systems: Constitutional: negative for fever or malaise Ophthalmic: negative for photophobia, double vision or loss of vision Cardiovascular: negative for chest pain, dyspnea on exertion, or new LE swelling Respiratory: negative for SOB or persistent cough Gastrointestinal: negative for abdominal pain, change in bowel habits or melena Genitourinary: negative for dysuria or gross hematuria, no abnormal uterine bleeding or disharge Musculoskeletal: negative for new gait disturbance or muscular weakness Integumentary: negative  for new or persistent rashes, no breast lumps Neurological: negative for TIA or stroke symptoms Psychiatric: negative for SI or delusions Allergic/Immunologic: negative for hives  Patient Care Team    Relationship Specialty Notifications Start End  Leamon Arnt, MD PCP - General Family Medicine  05/25/17   Mosetta Anis, MD Referring Physician Allergy  05/25/17   Consuella Lose, MD Consulting Physician Neurosurgery  10/28/17   Mottinger, Sharyn Lull  Dentistry  10/28/17     Objective  Vitals: BP (!) 142/80 Comment: avg by home readings  Pulse 63   Temp 97.7 F (36.5 C) (Temporal)   Resp 15   Ht 5\' 4"  (1.626 m)   Wt 192 lb 6.4 oz (87.3 kg)   SpO2 98%   BMI 33.03 kg/m  General:  Well developed, well nourished, no acute distress  Psych:  Alert and orientedx3,normal mood and affect HEENT:  Normocephalic, atraumatic, non-icteric sclera,  supple neck without adenopathy, mass or thyromegaly Cardiovascular:  Normal S1, S2, RRR without gallop, rub or murmur Respiratory:  Good breath sounds bilaterally, CTAB with normal respiratory effort Gastrointestinal: normal bowel sounds, soft, non-tender, no noted masses. No HSM MSK: no deformities, contusions. Joints are without erythema or swelling.  Skin:  Warm, no rashes or suspicious lesions noted Neurologic:    Mental status is normal.  Normal gait. No tremor    Commons side effects, risks, benefits, and alternatives for medications and treatment plan prescribed today were discussed, and the patient expressed understanding of the given instructions. Patient is instructed to call or message via MyChart if he/she has any questions or concerns regarding our treatment plan. No barriers to understanding were identified. We discussed Red Flag symptoms and signs in detail. Patient expressed understanding regarding what to do in case of urgent or emergency type symptoms.   Medication list was reconciled, printed and provided to the patient in AVS.  Patient instructions and summary information was reviewed with the patient as documented in the AVS. This note was prepared with assistance of Dragon voice recognition software. Occasional wrong-word or sound-a-like substitutions may have occurred due to the inherent limitations of voice recognition software  This visit occurred during the SARS-CoV-2 public health emergency.  Safety protocols were in place, including screening questions prior to the visit, additional usage of staff PPE, and extensive cleaning of exam room while observing appropriate contact time as indicated for disinfecting solutions.

## 2020-03-28 LAB — COMPLETE METABOLIC PANEL WITH GFR
AG Ratio: 1.4 (calc) (ref 1.0–2.5)
ALT: 17 U/L (ref 6–29)
AST: 20 U/L (ref 10–35)
Albumin: 4 g/dL (ref 3.6–5.1)
Alkaline phosphatase (APISO): 83 U/L (ref 37–153)
BUN/Creatinine Ratio: 15 (calc) (ref 6–22)
BUN: 28 mg/dL — ABNORMAL HIGH (ref 7–25)
CO2: 26 mmol/L (ref 20–32)
Calcium: 9.8 mg/dL (ref 8.6–10.4)
Chloride: 106 mmol/L (ref 98–110)
Creat: 1.81 mg/dL — ABNORMAL HIGH (ref 0.60–0.93)
GFR, Est African American: 30 mL/min/{1.73_m2} — ABNORMAL LOW (ref >=60)
GFR, Est Non African American: 26 mL/min/{1.73_m2} — ABNORMAL LOW (ref >=60)
Globulin: 2.8 g/dL (calc) (ref 1.9–3.7)
Glucose, Bld: 119 mg/dL — ABNORMAL HIGH (ref 65–99)
Potassium: 4.5 mmol/L (ref 3.5–5.3)
Sodium: 141 mmol/L (ref 135–146)
Total Bilirubin: 0.6 mg/dL (ref 0.2–1.2)
Total Protein: 6.8 g/dL (ref 6.1–8.1)

## 2020-03-28 LAB — CBC WITH DIFFERENTIAL/PLATELET
Absolute Monocytes: 752 cells/uL (ref 200–950)
Basophils Absolute: 64 cells/uL (ref 0–200)
Basophils Relative: 0.8 %
Eosinophils Absolute: 304 cells/uL (ref 15–500)
Eosinophils Relative: 3.8 %
HCT: 38.9 % (ref 35.0–45.0)
Hemoglobin: 12.6 g/dL (ref 11.7–15.5)
Lymphs Abs: 2208 cells/uL (ref 850–3900)
MCH: 30.9 pg (ref 27.0–33.0)
MCHC: 32.4 g/dL (ref 32.0–36.0)
MCV: 95.3 fL (ref 80.0–100.0)
MPV: 9.6 fL (ref 7.5–12.5)
Monocytes Relative: 9.4 %
Neutro Abs: 4672 cells/uL (ref 1500–7800)
Neutrophils Relative %: 58.4 %
Platelets: 216 10*3/uL (ref 140–400)
RBC: 4.08 10*6/uL (ref 3.80–5.10)
RDW: 12.2 % (ref 11.0–15.0)
Total Lymphocyte: 27.6 %
WBC: 8 10*3/uL (ref 3.8–10.8)

## 2020-03-28 LAB — LIPID PANEL
Cholesterol: 137 mg/dL (ref <200)
HDL: 43 mg/dL — ABNORMAL LOW (ref >=50)
LDL Cholesterol (Calc): 67 mg/dL (calc)
Non-HDL Cholesterol (Calc): 94 mg/dL (calc) (ref <130)
Total CHOL/HDL Ratio: 3.2 (calc) (ref <5.0)
Triglycerides: 199 mg/dL — ABNORMAL HIGH (ref <150)

## 2020-03-28 LAB — TSH: TSH: 1.92 mIU/L (ref 0.40–4.50)

## 2020-03-28 LAB — HEMOGLOBIN A1C
Hgb A1c MFr Bld: 6.3 % of total Hgb — ABNORMAL HIGH (ref <5.7)
Mean Plasma Glucose: 134 (calc)
eAG (mmol/L): 7.4 (calc)

## 2020-04-04 ENCOUNTER — Encounter: Payer: Self-pay | Admitting: Physical Therapy

## 2020-04-04 ENCOUNTER — Ambulatory Visit: Payer: Medicare PPO | Attending: Family Medicine | Admitting: Physical Therapy

## 2020-04-04 ENCOUNTER — Other Ambulatory Visit: Payer: Self-pay

## 2020-04-04 DIAGNOSIS — R252 Cramp and spasm: Secondary | ICD-10-CM

## 2020-04-04 DIAGNOSIS — M545 Low back pain, unspecified: Secondary | ICD-10-CM | POA: Insufficient documentation

## 2020-04-04 DIAGNOSIS — R262 Difficulty in walking, not elsewhere classified: Secondary | ICD-10-CM | POA: Insufficient documentation

## 2020-04-04 DIAGNOSIS — G8929 Other chronic pain: Secondary | ICD-10-CM | POA: Diagnosis not present

## 2020-04-04 DIAGNOSIS — M6281 Muscle weakness (generalized): Secondary | ICD-10-CM | POA: Diagnosis not present

## 2020-04-04 NOTE — Patient Instructions (Signed)
Access Code: [email or web address] URL: [email or web address] Date: 04/04/2020 Prepared by: Amador Cunas  Exercises Supine Lower Trunk Rotation - 1 x daily - 7 x weekly - 3 sets - 10 reps - 5 sec hold Supine Single Knee to Chest Stretch - 1 x daily - 7 x weekly - 3 sets - 5 reps - 5-10 sec hold Supine Bridge - 1 x daily - 7 x weekly - 3 sets - 10 reps - 3 sec hold Sit to Stand without Arm Support - 1 x daily - 7 x weekly - 3 sets - 10 reps

## 2020-04-04 NOTE — Therapy (Signed)
Sabrina Ramirez. [place], [name], [address] Phone: [phone]   Fax:  [phone]  Physical Therapy Evaluation  Patient Details  Name: Sabrina Ramirez Val MRN: [id number] Date of Birth: [birth year] Referring Provider (PT): Rockne Coons Date: 04/04/2020   PT End of Session - 04/04/20 1553    Visit Number 1    Date for PT Re-Evaluation 06/04/20    PT Start Time 5102    PT Stop Time 5852    PT Time Calculation (min) 45 min    Activity Tolerance Patient tolerated treatment well    Behavior During Therapy [facility] for tasks assessed/performed           Past Medical History:  Diagnosis Date   Allergic asthma, mild intermittent, uncomplicated 77/82/4235   Dr. Donneta Romberg, allergist   Arthritis    Chronic kidney disease    Stage III kidney disease   GERD (gastroesophageal reflux disease)    Hemorrhoids    History of kidney stones    Hypertension    IBS (irritable bowel syndrome)    IFG (impaired fasting glucose) 03/09/2019   a1c 6.3 02/2019   Psoriasis     Past Surgical History:  Procedure Laterality Date   ABDOMINAL HYSTERECTOMY  1987   CATARACT EXTRACTION W/ INTRAOCULAR LENS  IMPLANT, BILATERAL Bilateral 2015   CHOLECYSTECTOMY     HEMORRHOID SURGERY     SPINE SURGERY  07/10/2017   TONSILLECTOMY      There were no vitals filed for this visit.    Subjective Assessment - 04/04/20 1447    Subjective Pt reports she was here last year, 2020, for LBP and general endurance issues. States that she got better with PT but then when she stopped coming she started to get worse; activity has decreased since stopping PT and has not been doing exercises. Pt reports that she is having trouble walking longer distances and has to carry a cane.    Pertinent History lumbar fusion 2019    Limitations Standing;Walking;House hold activities    How long can you stand comfortably? ~1-2 minutes (esp w/out cane or walker)      Diagnostic tests MRI lumbar spine    Patient Stated Goals reduce pain    Currently in Pain? Yes    Pain Score 3    when standing/walking; reports no pain with sitting   Pain Location Back    Pain Orientation Lower    Pain Descriptors / Indicators Aching    Pain Type Chronic pain    Pain Onset More than a month ago    Pain Frequency Intermittent    Aggravating Factors  standing, walking, cleaning, household activities (esp laundry)    Pain Relieving Factors rest, sitting, heat, tylenol              [facility] PT Assessment - 04/04/20 0001      Assessment   Medical Diagnosis LBP    Referring Provider (PT) Jonni Sanger    Prior Therapy PT for LBP 2020      Precautions   Precautions None      Restrictions   Weight Bearing Restrictions No      Balance Screen   Has the patient fallen in the past 6 months No    Has the patient had a decrease in activity level because of a fear of falling?  No    Is the patient reluctant to leave their home because of a fear of falling?  No  Home Environment   Additional Comments stairs to bonus room that she reports she does not use often; states some trouble getting up/down stairs d/t endurance and pain      Prior Function   Level of Independence Independent    Vocation Retired    Leisure walking, cooking (wants to be able to stand to cook w/o getting so fatigued)      Posture/Postural Control   Posture/Postural Control Postural limitations    Postural Limitations Rounded Shoulders;Forward head      ROM / Strength   AROM / PROM / Strength AROM;Strength      AROM   Overall AROM Comments lumbar AROM 25% limited      Strength   Overall Strength Comments BLE strength 4+/5 except B hip abd/ext 4-/5      Flexibility   Soft Tissue Assessment /Muscle Length yes    Hamstrings tight B    Quadriceps mild tightness    Piriformis tight B      Palpation   Palpation comment tender to palpation lumbar paraspinals      Ambulation/Gait   Gait  Comments mild antalgic gait with decrease arm swing      Standardized Balance Assessment   Standardized Balance Assessment Five Times Sit to Stand;Timed Up and Go Test    Five times sit to stand comments  16.52 sec       Timed Up and Go Test   Normal TUG (seconds) 11.61    TUG Comments no AD                      Objective measurements completed on examination: See above findings.       [facility] Adult PT Treatment/Exercise - 04/04/20 0001      Exercises   Exercises Lumbar      Lumbar Exercises: Stretches   Single Knee to Chest Stretch 5 reps;10 seconds;Right;Left    Lower Trunk Rotation 5 reps;10 seconds      Lumbar Exercises: Seated   Sit to Stand 10 reps      Lumbar Exercises: Supine   Bridge 10 reps;3 seconds      Modalities   Modalities Electrical Stimulation;Moist Heat      Moist Heat Therapy   Number Minutes Moist Heat 15 Minutes    Moist Heat Location Lumbar Spine      Electrical Stimulation   Electrical Stimulation Location Lumbar    Electrical Stimulation Action IFC    Electrical Stimulation Parameters supine    Electrical Stimulation Goals Pain                  PT Education - 04/04/20 1553    Education Details Pt educated on POC and HEP    Person(s) Educated Patient    Methods Explanation;Demonstration;Handout    Comprehension Verbalized understanding;Returned demonstration            PT Short Term Goals - 04/04/20 1600      PT SHORT TERM GOAL #1   Title Patient independent with HEP    Time 2    Period Weeks    Status New    Target Date 04/18/20             PT Long Term Goals - 04/04/20 1600      PT LONG TERM GOAL #1   Title Pt will be I with advanced HEP    Time 8    Period Weeks    Status New  Target Date 05/30/20      PT LONG TERM GOAL #2   Title Pt will report able to stand to cook meals without sitting to rest    Time 8    Period Weeks    Status New    Target Date 05/30/20      PT LONG TERM GOAL #3    Title Pt will report able to walk >10 min with </= 2/10 LBP    Time 8    Period Weeks    Status New    Target Date 05/30/20      PT LONG TERM GOAL #4   Title Pt will demo BLE strength >/= 4+/5    Time 8    Period Weeks    Status New    Target Date 05/30/20      PT LONG TERM GOAL #5   Title Pt will be able to ambulate one lap around PT building and stairs without needing rest break, to show improved endurance for community ambulation.    Time 8    Period Weeks    Status New    Target Date 05/30/20                  Plan - 04/04/20 1553    Clinical Impression Statement Pt presents to clinic with recurrence of B LBP worsening over the past 6 months. Pt has hx of lumbar fusion in 2019 and prior PT at [facility] for R LBP in 2020; states this was very helpful. Since then, she has experienced a decline in activity level and endurance. Pt is no longer able to stand for more than a few minutes at a time and reports trouble standing to cook a meal or clean dishes; has to take multiple breaks. Pt demos LE flexibility deficits, functional LE strength deficits, endurance deficits, and core weakness. Pt would benefit from skilled PT to address the above impairments.    Examination-Activity Limitations Bend;Locomotion Level;Stand    Examination-Participation Restrictions Community Activity;Interpersonal Relationship;Cleaning;Meal Prep    Stability/Clinical Decision Making Stable/Uncomplicated    Clinical Decision Making Low    Rehab Potential Good    PT Frequency 2x / week    PT Duration 8 weeks    PT Treatment/Interventions ADLs/Self Care Home Management;Electrical Stimulation;Iontophoresis 4mg /ml Dexamethasone;Moist Heat;Therapeutic exercise;Therapeutic activities;Stair training;Gait training;Patient/family education;Manual techniques    PT Next Visit Plan core stab, LE strength/flexibility, endurance ex's, manual/modalities as indicated    PT Home Exercise Plan LTR, bridges, STS, SKTC     Consulted and Agree with Plan of Care Patient           Patient will benefit from skilled therapeutic intervention in order to improve the following deficits and impairments:  Abnormal gait, Decreased range of motion, Difficulty walking, Decreased endurance, Increased muscle spasms, Decreased activity tolerance, Pain, Impaired flexibility, Decreased strength, Postural dysfunction  Visit Diagnosis: Muscle weakness (generalized)  Difficulty in walking, not elsewhere classified  Chronic bilateral low back pain without sciatica  Cramp and spasm     Problem List Patient Active Problem List   Diagnosis Date Noted   IFG (impaired fasting glucose) 03/09/2019   Localized primary osteoarthritis of carpometacarpal joint of left thumb 03/08/2019   On statin therapy due to risk of future cardiovascular event 12/01/2017   Spondylolisthesis at L5-S1 level 08/13/2017   Colon polyp 05/25/2017   Ophthalmic migraine 05/25/2017   Essential tremor 05/25/2017   Allergic asthma, mild intermittent, uncomplicated 44/06/270   Obesity, Class I, BMI 30-34.9 04/16/2015  Conjunctivitis, allergic, chronic 10/01/2012   Hypertriglyceridemia 09/22/2011   Rosacea 06/25/2011   Irritable bowel syndrome with diarrhea 07/08/2010   Benign essential hypertension 05/23/2010   CKD (chronic kidney disease) stage 3, GFR 30-59 ml/min (HCC) 05/23/2010   Chronic allergic rhinitis 02/25/2010   GERD (gastroesophageal reflux disease) 02/25/2010   Psoriasis 02/25/2010   Osteoarthritis, multiple sites 06/12/2009   Amador Cunas, PT, DPT Donald Prose Carmine Youngberg 04/04/2020, 4:03 PM  [place] [place], [name], [address] Phone: [phone]   Fax:  [phone]  Name: Ayva Veilleux Manzano MRN: [id number] Date of Birth: 1941/03/16

## 2020-04-10 ENCOUNTER — Ambulatory Visit: Payer: Medicare PPO | Attending: Family Medicine | Admitting: Physical Therapy

## 2020-04-10 ENCOUNTER — Encounter: Payer: Self-pay | Admitting: Physical Therapy

## 2020-04-10 ENCOUNTER — Other Ambulatory Visit: Payer: Self-pay

## 2020-04-10 DIAGNOSIS — M545 Low back pain, unspecified: Secondary | ICD-10-CM | POA: Insufficient documentation

## 2020-04-10 DIAGNOSIS — R252 Cramp and spasm: Secondary | ICD-10-CM | POA: Insufficient documentation

## 2020-04-10 DIAGNOSIS — M6281 Muscle weakness (generalized): Secondary | ICD-10-CM | POA: Diagnosis not present

## 2020-04-10 DIAGNOSIS — G8929 Other chronic pain: Secondary | ICD-10-CM | POA: Diagnosis not present

## 2020-04-10 DIAGNOSIS — R262 Difficulty in walking, not elsewhere classified: Secondary | ICD-10-CM | POA: Insufficient documentation

## 2020-04-10 DIAGNOSIS — M62830 Muscle spasm of back: Secondary | ICD-10-CM | POA: Insufficient documentation

## 2020-04-10 NOTE — Therapy (Signed)
[place]. [place], [name], [address] Phone: [phone]   Fax:  [phone]  Physical Therapy Treatment  Patient Details  Name: Sabrina Ramirez MRN: [id number] Date of Birth: 01-25-1941 Referring Provider (PT): Rockne Coons Date: 04/10/2020   PT End of Session - 04/10/20 1229    Date for PT Re-Evaluation 06/04/20           Past Medical History:  Diagnosis Date  . Allergic asthma, mild intermittent, uncomplicated 96/28/3662   Dr. Donneta Romberg, allergist  . Arthritis   . Chronic kidney disease    Stage III kidney disease  . GERD (gastroesophageal reflux disease)   . Hemorrhoids   . History of kidney stones   . Hypertension   . IBS (irritable bowel syndrome)   . IFG (impaired fasting glucose) 03/09/2019   a1c 6.3 02/2019  . Psoriasis     Past Surgical History:  Procedure Laterality Date  . ABDOMINAL HYSTERECTOMY  1987  . CATARACT EXTRACTION W/ INTRAOCULAR LENS  IMPLANT, BILATERAL Bilateral 2015  . CHOLECYSTECTOMY    . HEMORRHOID SURGERY    . SPINE SURGERY  07/10/2017  . TONSILLECTOMY      There were no vitals filed for this visit.   Subjective Assessment - 04/10/20 1150    Subjective Patient presented to PT today with localized pain in her L lowerback. She said she thinks she over did it with exercises 3 days ago and that she thinks she slept funny last night. Commented that the single leg to chest exercise is what made her back hurt.    Currently in Pain? Yes    Pain Score 5                              [facility] Adult PT Treatment/Exercise - 04/10/20 0001      Lumbar Exercises: Stretches   Passive Hamstring Stretch 2 reps;30 seconds    Single Knee to Chest Stretch 1 rep   Pain with exercise in L side SI joint   Lower Trunk Rotation 5 reps;10 seconds      Lumbar Exercises: Aerobic   Recumbent Bike L2 5 mins    Nustep L4 50mins       Lumbar Exercises: Supine   Clam 10 reps;3  seconds   Green TB   Bridge 10 reps;3 seconds;20 reps      Modalities   Modalities Electrical Stimulation      Moist Heat Therapy   Number Minutes Moist Heat 15 Minutes    Moist Heat Location Lumbar Spine      Electrical Stimulation   Electrical Stimulation Location Lumbar    Electrical Stimulation Action IFC    Electrical Stimulation Parameters supine    Electrical Stimulation Goals Pain                    PT Short Term Goals - 04/04/20 1600      PT SHORT TERM GOAL #1   Title Patient independent with HEP    Time 2    Period Weeks    Status New    Target Date 04/18/20             PT Long Term Goals - 04/04/20 1600      PT LONG TERM GOAL #1   Title Pt will be I with advanced HEP    Time 8    Period Weeks  Status New    Target Date 05/30/20      PT LONG TERM GOAL #2   Title Pt will report able to stand to cook meals without sitting to rest    Time 8    Period Weeks    Status New    Target Date 05/30/20      PT LONG TERM GOAL #3   Title Pt will report able to walk >10 min with </= 2/10 LBP    Time 8    Period Weeks    Status New    Target Date 05/30/20      PT LONG TERM GOAL #4   Title Pt will demo BLE strength >/= 4+/5    Time 8    Period Weeks    Status New    Target Date 05/30/20      PT LONG TERM GOAL #5   Title Pt will be able to ambulate one lap around PT building and stairs without needing rest break, to show improved endurance for community ambulation.    Time 8    Period Weeks    Status New    Target Date 05/30/20                 Plan - 04/10/20 1230    Clinical Impression Statement Patient presents to clinic with localized back pain in her L lower back. She came in with an altalgic gait due to her back pain. She commented that last therapy session was really helpful but now she is experiencing localized pain in her left lower back that is tender to palpation but was relieved with some soft-tissue work, hip activation  exercises, and stretching.    PT Treatment/Interventions ADLs/Self Care Home Management;Electrical Stimulation;Iontophoresis 4mg /ml Dexamethasone;Moist Heat;Therapeutic exercise;Therapeutic activities;Stair training;Gait training;Patient/family education;Manual techniques    PT Next Visit Plan Check in about last session back pain in L lower back. Continue with core stabilization, LE stregnth/flex. and endurance exercises. Modalities as indicated.           Patient will benefit from skilled therapeutic intervention in order to improve the following deficits and impairments:  Abnormal gait, Decreased range of motion, Difficulty walking, Decreased endurance, Increased muscle spasms, Decreased activity tolerance, Pain, Impaired flexibility, Decreased strength, Postural dysfunction  Visit Diagnosis: Muscle weakness (generalized)  Difficulty in walking, not elsewhere classified  Chronic bilateral low back pain without sciatica     Problem List Patient Active Problem List   Diagnosis Date Noted  . IFG (impaired fasting glucose) 03/09/2019  . Localized primary osteoarthritis of carpometacarpal joint of left thumb 03/08/2019  . On statin therapy due to risk of future cardiovascular event 12/01/2017  . Spondylolisthesis at L5-S1 level 08/13/2017  . Colon polyp 05/25/2017  . Ophthalmic migraine 05/25/2017  . Essential tremor 05/25/2017  . Allergic asthma, mild intermittent, uncomplicated 50/56/9794  . Obesity, Class I, BMI 30-34.9 04/16/2015  . Conjunctivitis, allergic, chronic 10/01/2012  . Hypertriglyceridemia 09/22/2011  . Rosacea 06/25/2011  . Irritable bowel syndrome with diarrhea 07/08/2010  . Benign essential hypertension 05/23/2010  . CKD (chronic kidney disease) stage 3, GFR 30-59 ml/min (HCC) 05/23/2010  . Chronic allergic rhinitis 02/25/2010  . GERD (gastroesophageal reflux disease) 02/25/2010  . Psoriasis 02/25/2010  . Osteoarthritis, multiple sites 06/12/2009    Lavenia Atlas, SPTA 04/10/2020, 12:36 PM  [place]. [place], [name], [address] Phone: [phone]   Fax:  [phone]  Name: Sabrina Ramirez MRN: [id number] Date  of Birth: May 09, 1941

## 2020-04-12 ENCOUNTER — Encounter: Payer: Self-pay | Admitting: Physical Therapy

## 2020-04-12 ENCOUNTER — Other Ambulatory Visit: Payer: Self-pay

## 2020-04-12 ENCOUNTER — Ambulatory Visit: Payer: Medicare PPO | Admitting: Physical Therapy

## 2020-04-12 DIAGNOSIS — M545 Low back pain, unspecified: Secondary | ICD-10-CM

## 2020-04-12 DIAGNOSIS — M62830 Muscle spasm of back: Secondary | ICD-10-CM

## 2020-04-12 DIAGNOSIS — R252 Cramp and spasm: Secondary | ICD-10-CM | POA: Diagnosis not present

## 2020-04-12 DIAGNOSIS — R262 Difficulty in walking, not elsewhere classified: Secondary | ICD-10-CM | POA: Diagnosis not present

## 2020-04-12 DIAGNOSIS — M6281 Muscle weakness (generalized): Secondary | ICD-10-CM

## 2020-04-12 DIAGNOSIS — G8929 Other chronic pain: Secondary | ICD-10-CM

## 2020-04-12 NOTE — Therapy (Signed)
[place]. [place], [name], [address] Phone: [phone]   Fax:  [phone]  Physical Therapy Treatment  Patient Details  Name: Sabrina Ramirez MRN: [id number] Date of Birth: 12-Nov-1940 Referring Provider (PT): Rockne Coons Date: 04/12/2020   PT End of Session - 04/12/20 1425    Visit Number 3    Date for PT Re-Evaluation 06/04/20    PT Start Time 1345    PT Stop Time 1027    PT Time Calculation (min) 53 min    Activity Tolerance Patient limited by pain    Behavior During Therapy [facility] for tasks assessed/performed           Past Medical History:  Diagnosis Date   Allergic asthma, mild intermittent, uncomplicated 25/36/6440   Dr. Donneta Romberg, allergist   Arthritis    Chronic kidney disease    Stage III kidney disease   GERD (gastroesophageal reflux disease)    Hemorrhoids    History of kidney stones    Hypertension    IBS (irritable bowel syndrome)    IFG (impaired fasting glucose) 03/09/2019   a1c 6.3 02/2019   Psoriasis     Past Surgical History:  Procedure Laterality Date   ABDOMINAL HYSTERECTOMY  1987   CATARACT EXTRACTION W/ INTRAOCULAR LENS  IMPLANT, BILATERAL Bilateral 2015   CHOLECYSTECTOMY     HEMORRHOID SURGERY     SPINE SURGERY  07/10/2017   TONSILLECTOMY      There were no vitals filed for this visit.   Subjective Assessment - 04/12/20 1351    Subjective Feeling better than she did Tuesday, still having a little pain in the low back    Currently in Pain? No/denies                             [facility] Adult PT Treatment/Exercise - 04/12/20 0001      Lumbar Exercises: Aerobic   Recumbent Bike L2 4 mins    Nustep L4  5 mins       Lumbar Exercises: Machines for Strengthening   Cybex Knee Extension 5lb 2x10    Cybex Knee Flexion 20lb 2x10       Lumbar Exercises: Standing   Shoulder Extension Theraband;20 reps;Both;Strengthening      Lumbar  Exercises: Seated   Sit to Stand 10 reps;5 reps   slightly elevated surface     Modalities   Modalities Electrical Stimulation      Moist Heat Therapy   Number Minutes Moist Heat 13 Minutes    Moist Heat Location Lumbar Spine      Electrical Stimulation   Electrical Stimulation Location Lumbar    Electrical Stimulation Action IFC    Electrical Stimulation Parameters supine    Electrical Stimulation Goals Pain                    PT Short Term Goals - 04/04/20 1600      PT SHORT TERM GOAL #1   Title Patient independent with HEP    Time 2    Period Weeks    Status New    Target Date 04/18/20             PT Long Term Goals - 04/04/20 1600      PT LONG TERM GOAL #1   Title Pt will be I with advanced HEP    Time 8    Period Weeks  Status New    Target Date 05/30/20      PT LONG TERM GOAL #2   Title Pt will report able to stand to cook meals without sitting to rest    Time 8    Period Weeks    Status New    Target Date 05/30/20      PT LONG TERM GOAL #3   Title Pt will report able to walk >10 min with </= 2/10 LBP    Time 8    Period Weeks    Status New    Target Date 05/30/20      PT LONG TERM GOAL #4   Title Pt will demo BLE strength >/= 4+/5    Time 8    Period Weeks    Status New    Target Date 05/30/20      PT LONG TERM GOAL #5   Title Pt will be able to ambulate one lap around PT building and stairs without needing rest break, to show improved endurance for community ambulation.    Time 8    Period Weeks    Status New    Target Date 05/30/20                 Plan - 04/12/20 1426    Clinical Impression Statement Pt enters clinic reporting less low back pain compared to last session. After aerobic warm up abd sit to stands she stated her LBP had increased. Pressure applied to the area did relieve some pain. Cues for core engagement needed with standing rows and extensions. LE burning reported with seated curls and extensions.     Examination-Activity Limitations Bend;Locomotion Level;Stand    Examination-Participation Restrictions Community Activity;Interpersonal Relationship;Cleaning;Meal Prep    Stability/Clinical Decision Making Stable/Uncomplicated    Rehab Potential Good    PT Frequency 2x / week    PT Treatment/Interventions ADLs/Self Care Home Management;Electrical Stimulation;Iontophoresis 4mg /ml Dexamethasone;Moist Heat;Therapeutic exercise;Therapeutic activities;Stair training;Gait training;Patient/family education;Manual techniques    PT Next Visit Plan Continue with core stabilization, LE stregnth/flex. and endurance exercises. Modalities as indicated.           Patient will benefit from skilled therapeutic intervention in order to improve the following deficits and impairments:  Abnormal gait, Decreased range of motion, Difficulty walking, Decreased endurance, Increased muscle spasms, Decreased activity tolerance, Pain, Impaired flexibility, Decreased strength, Postural dysfunction  Visit Diagnosis: Difficulty in walking, not elsewhere classified  Muscle weakness (generalized)  Chronic bilateral low back pain without sciatica  Muscle spasm of back  Cramp and spasm     Problem List Patient Active Problem List   Diagnosis Date Noted   IFG (impaired fasting glucose) 03/09/2019   Localized primary osteoarthritis of carpometacarpal joint of left thumb 03/08/2019   On statin therapy due to risk of future cardiovascular event 12/01/2017   Spondylolisthesis at L5-S1 level 08/13/2017   Colon polyp 05/25/2017   Ophthalmic migraine 05/25/2017   Essential tremor 05/25/2017   Allergic asthma, mild intermittent, uncomplicated 47/82/9562   Obesity, Class I, BMI 30-34.9 04/16/2015   Conjunctivitis, allergic, chronic 10/01/2012   Hypertriglyceridemia 09/22/2011   Rosacea 06/25/2011   Irritable bowel syndrome with diarrhea 07/08/2010   Benign essential hypertension 05/23/2010   CKD  (chronic kidney disease) stage 3, GFR 30-59 ml/min (HCC) 05/23/2010   Chronic allergic rhinitis 02/25/2010   GERD (gastroesophageal reflux disease) 02/25/2010   Psoriasis 02/25/2010   Osteoarthritis, multiple sites 06/12/2009    Scot Jun, PTA 04/12/2020, 2:29 PM  [place]  [place]. [place], [name], [address] Phone: [phone]   Fax:  [phone]  Name: Sabrina Ramirez MRN: [id number] Date of Birth: Dec 13, 1940

## 2020-04-17 ENCOUNTER — Ambulatory Visit: Payer: Medicare PPO | Admitting: Physical Therapy

## 2020-04-17 ENCOUNTER — Encounter: Payer: Self-pay | Admitting: Physical Therapy

## 2020-04-17 ENCOUNTER — Other Ambulatory Visit: Payer: Self-pay

## 2020-04-17 DIAGNOSIS — R262 Difficulty in walking, not elsewhere classified: Secondary | ICD-10-CM

## 2020-04-17 DIAGNOSIS — M6281 Muscle weakness (generalized): Secondary | ICD-10-CM

## 2020-04-17 DIAGNOSIS — G8929 Other chronic pain: Secondary | ICD-10-CM

## 2020-04-17 DIAGNOSIS — M62830 Muscle spasm of back: Secondary | ICD-10-CM

## 2020-04-17 DIAGNOSIS — M545 Low back pain, unspecified: Secondary | ICD-10-CM | POA: Diagnosis not present

## 2020-04-17 DIAGNOSIS — R252 Cramp and spasm: Secondary | ICD-10-CM | POA: Diagnosis not present

## 2020-04-17 NOTE — Therapy (Signed)
[place]. [place], [name], [address] Phone: [phone]   Fax:  [phone]  Physical Therapy Treatment  Patient Details  Name: Sabrina Ramirez MRN: [id number] Date of Birth: 05/17/1941 Referring Provider (PT): Rockne Coons Date: 04/17/2020   PT End of Session - 04/17/20 1154    Visit Number 4    Date for PT Re-Evaluation 06/04/20    PT Start Time 1100    PT Stop Time 1147    PT Time Calculation (min) 47 min    Activity Tolerance Patient limited by pain    Behavior During Therapy [facility] for tasks assessed/performed           Past Medical History:  Diagnosis Date  . Allergic asthma, mild intermittent, uncomplicated 03/25/5101   Dr. Donneta Romberg, allergist  . Arthritis   . Chronic kidney disease    Stage III kidney disease  . GERD (gastroesophageal reflux disease)   . Hemorrhoids   . History of kidney stones   . Hypertension   . IBS (irritable bowel syndrome)   . IFG (impaired fasting glucose) 03/09/2019   a1c 6.3 02/2019  . Psoriasis     Past Surgical History:  Procedure Laterality Date  . ABDOMINAL HYSTERECTOMY  1987  . CATARACT EXTRACTION W/ INTRAOCULAR LENS  IMPLANT, BILATERAL Bilateral 2015  . CHOLECYSTECTOMY    . HEMORRHOID SURGERY    . SPINE SURGERY  07/10/2017  . TONSILLECTOMY      There were no vitals filed for this visit.   Subjective Assessment - 04/17/20 1105    Subjective Pt reports feeling better    Currently in Pain? No/denies    Pain Score 0-No pain    Pain Location Back                             [facility] Adult PT Treatment/Exercise - 04/17/20 0001      Lumbar Exercises: Aerobic   Recumbent Bike L2 x 5 min    Nustep L4 x 6 min      Lumbar Exercises: Standing   Heel Raises 10 reps    Heel Raises Limitations on black bar    Other Standing Lumbar Exercises fwd/bkwd resisted gait 20#   discontinued d/t pt c/o LBP with exercise     Lumbar Exercises: Seated    Long Arc Quad on Chair Both;2 sets;10 reps    LAQ on Chair Weights (lbs) 3    Other Seated Lumbar Exercises seated ball squeeze 3 sec hold x10    Other Seated Lumbar Exercises seated marching with 3# weight      Moist Heat Therapy   Number Minutes Moist Heat 15 Minutes    Moist Heat Location Lumbar Spine      Electrical Stimulation   Electrical Stimulation Location Lumbar    Electrical Stimulation Action IFC    Electrical Stimulation Parameters supine    Electrical Stimulation Goals Pain                    PT Short Term Goals - 04/17/20 1156      PT SHORT TERM GOAL #1   Title Patient independent with HEP    Time 2    Period Weeks    Status Achieved    Target Date 04/18/20             PT Long Term Goals - 04/04/20 1600  PT LONG TERM GOAL #1   Title Pt will be I with advanced HEP    Time 8    Period Weeks    Status New    Target Date 05/30/20      PT LONG TERM GOAL #2   Title Pt will report able to stand to cook meals without sitting to rest    Time 8    Period Weeks    Status New    Target Date 05/30/20      PT LONG TERM GOAL #3   Title Pt will report able to walk >10 min with </= 2/10 LBP    Time 8    Period Weeks    Status New    Target Date 05/30/20      PT LONG TERM GOAL #4   Title Pt will demo BLE strength >/= 4+/5    Time 8    Period Weeks    Status New    Target Date 05/30/20      PT LONG TERM GOAL #5   Title Pt will be able to ambulate one lap around PT building and stairs without needing rest break, to show improved endurance for community ambulation.    Time 8    Period Weeks    Status New    Target Date 05/30/20                 Plan - 04/17/20 1155    Clinical Impression Statement Pt reports that she was sore for a few days following last rx but LB is feeling better today. Difficulty with standing endurance for back ex's; discontinued standing resisted gait d/t complaints of pressure and increase in pain in LB  with this exercise. Pt reports pain relieved by sitting down. Did well with seated exercises. Continue to progress standing endurance as tolerated.    PT Treatment/Interventions ADLs/Self Care Home Management;Electrical Stimulation;Iontophoresis 4mg /ml Dexamethasone;Moist Heat;Therapeutic exercise;Therapeutic activities;Stair training;Gait training;Patient/family education;Manual techniques    PT Next Visit Plan Continue with core stabilization, LE stregnth/flex. and endurance exercises. Modalities as indicated.    Consulted and Agree with Plan of Care Patient           Patient will benefit from skilled therapeutic intervention in order to improve the following deficits and impairments:  Abnormal gait, Decreased range of motion, Difficulty walking, Decreased endurance, Increased muscle spasms, Decreased activity tolerance, Pain, Impaired flexibility, Decreased strength, Postural dysfunction  Visit Diagnosis: Difficulty in walking, not elsewhere classified  Muscle weakness (generalized)  Chronic bilateral low back pain without sciatica  Muscle spasm of back     Problem List Patient Active Problem List   Diagnosis Date Noted  . IFG (impaired fasting glucose) 03/09/2019  . Localized primary osteoarthritis of carpometacarpal joint of left thumb 03/08/2019  . On statin therapy due to risk of future cardiovascular event 12/01/2017  . Spondylolisthesis at L5-S1 level 08/13/2017  . Colon polyp 05/25/2017  . Ophthalmic migraine 05/25/2017  . Essential tremor 05/25/2017  . Allergic asthma, mild intermittent, uncomplicated 19/37/9024  . Obesity, Class I, BMI 30-34.9 04/16/2015  . Conjunctivitis, allergic, chronic 10/01/2012  . Hypertriglyceridemia 09/22/2011  . Rosacea 06/25/2011  . Irritable bowel syndrome with diarrhea 07/08/2010  . Benign essential hypertension 05/23/2010  . CKD (chronic kidney disease) stage 3, GFR 30-59 ml/min (HCC) 05/23/2010  . Chronic allergic rhinitis  02/25/2010  . GERD (gastroesophageal reflux disease) 02/25/2010  . Psoriasis 02/25/2010  . Osteoarthritis, multiple sites 06/12/2009   Amador Cunas, PT, DPT Donald Prose  Lisette Mancebo 04/17/2020, 11:57 AM  [place]. [place], [name], [address] Phone: [phone]   Fax:  [phone]  Name: Milaya Hora Etcheverry MRN: [id number] Date of Birth: 1940-11-17

## 2020-04-20 ENCOUNTER — Other Ambulatory Visit: Payer: Self-pay

## 2020-04-20 ENCOUNTER — Ambulatory Visit: Payer: Medicare PPO | Admitting: Physical Therapy

## 2020-04-20 ENCOUNTER — Encounter: Payer: Self-pay | Admitting: Physical Therapy

## 2020-04-20 DIAGNOSIS — M6281 Muscle weakness (generalized): Secondary | ICD-10-CM

## 2020-04-20 DIAGNOSIS — M62830 Muscle spasm of back: Secondary | ICD-10-CM | POA: Diagnosis not present

## 2020-04-20 DIAGNOSIS — R252 Cramp and spasm: Secondary | ICD-10-CM | POA: Diagnosis not present

## 2020-04-20 DIAGNOSIS — G8929 Other chronic pain: Secondary | ICD-10-CM | POA: Diagnosis not present

## 2020-04-20 DIAGNOSIS — R262 Difficulty in walking, not elsewhere classified: Secondary | ICD-10-CM

## 2020-04-20 DIAGNOSIS — M545 Low back pain, unspecified: Secondary | ICD-10-CM | POA: Diagnosis not present

## 2020-04-20 NOTE — Therapy (Signed)
[place]. [place], [name], [address] Phone: [phone]   Fax:  [phone]  Physical Therapy Treatment  Patient Details  Name: Sabrina Ramirez MRN: [id number] Date of Birth: 14-Jun-1940 Referring Provider (PT): Rockne Coons Date: 04/20/2020   PT End of Session - 04/20/20 1057    Visit Number 5    Date for PT Re-Evaluation 06/04/20    PT Start Time 1024    PT Stop Time 1105    PT Time Calculation (min) 41 min    Activity Tolerance Patient tolerated treatment well    Behavior During Therapy [facility] for tasks assessed/performed           Past Medical History:  Diagnosis Date  . Allergic asthma, mild intermittent, uncomplicated 36/62/9476   Dr. Donneta Romberg, allergist  . Arthritis   . Chronic kidney disease    Stage III kidney disease  . GERD (gastroesophageal reflux disease)   . Hemorrhoids   . History of kidney stones   . Hypertension   . IBS (irritable bowel syndrome)   . IFG (impaired fasting glucose) 03/09/2019   a1c 6.3 02/2019  . Psoriasis     Past Surgical History:  Procedure Laterality Date  . ABDOMINAL HYSTERECTOMY  1987  . CATARACT EXTRACTION W/ INTRAOCULAR LENS  IMPLANT, BILATERAL Bilateral 2015  . CHOLECYSTECTOMY    . HEMORRHOID SURGERY    . SPINE SURGERY  07/10/2017  . TONSILLECTOMY      There were no vitals filed for this visit.   Subjective Assessment - 04/20/20 1025    Subjective Pt reports feeling better    Currently in Pain? No/denies    Pain Score 0-No pain    Pain Location Back                             [facility] Adult PT Treatment/Exercise - 04/20/20 0001      Lumbar Exercises: Aerobic   UBE (Upper Arm Bike) L3 3 min fwd/3 min bkwd    Nustep L5 x 6 min      Lumbar Exercises: Machines for Strengthening   Cybex Knee Extension 5lb 2x10    Cybex Knee Flexion 20lb 2x10       Lumbar Exercises: Standing   Heel Raises 10 reps    Row Both;20 reps     Theraband Level (Row) Level 3 (Green)    Shoulder Extension Both;20 reps    Theraband Level (Shoulder Extension) Level 3 (Green)      Lumbar Exercises: Seated   Sit to Stand 10 reps    Sit to Stand Limitations with yellow ball chest press      Moist Heat Therapy   Number Minutes Moist Heat 12 Minutes    Moist Heat Location Lumbar Spine      Electrical Stimulation   Electrical Stimulation Location Lumbar    Electrical Stimulation Action IFC    Electrical Stimulation Parameters supine    Electrical Stimulation Goals Pain                    PT Short Term Goals - 04/17/20 1156      PT SHORT TERM GOAL #1   Title Patient independent with HEP    Time 2    Period Weeks    Status Achieved    Target Date 04/18/20             PT Long Term  Goals - 04/04/20 1600      PT LONG TERM GOAL #1   Title Pt will be I with advanced HEP    Time 8    Period Weeks    Status New    Target Date 05/30/20      PT LONG TERM GOAL #2   Title Pt will report able to stand to cook meals without sitting to rest    Time 8    Period Weeks    Status New    Target Date 05/30/20      PT LONG TERM GOAL #3   Title Pt will report able to walk >10 min with </= 2/10 LBP    Time 8    Period Weeks    Status New    Target Date 05/30/20      PT LONG TERM GOAL #4   Title Pt will demo BLE strength >/= 4+/5    Time 8    Period Weeks    Status New    Target Date 05/30/20      PT LONG TERM GOAL #5   Title Pt will be able to ambulate one lap around PT building and stairs without needing rest break, to show improved endurance for community ambulation.    Time 8    Period Weeks    Status New    Target Date 05/30/20                 Plan - 04/20/20 1057    Clinical Impression Statement Pt able to tolerate increase in interventions this rx. Reported some burning/pain with knee extension/flexion machine. Cues for posture/form with standing rows and shoulder extensions. Continue to try to  push standing tolerance and tolerance to exercise. Pt reports relief from heat and estim.    PT Treatment/Interventions ADLs/Self Care Home Management;Electrical Stimulation;Iontophoresis 4mg /ml Dexamethasone;Moist Heat;Therapeutic exercise;Therapeutic activities;Stair training;Gait training;Patient/family education;Manual techniques    PT Next Visit Plan Continue with core stabilization, LE stregnth/flex. and endurance exercises. Modalities as indicated.    Consulted and Agree with Plan of Care Patient           Patient will benefit from skilled therapeutic intervention in order to improve the following deficits and impairments:  Abnormal gait, Decreased range of motion, Difficulty walking, Decreased endurance, Increased muscle spasms, Decreased activity tolerance, Pain, Impaired flexibility, Decreased strength, Postural dysfunction  Visit Diagnosis: Difficulty in walking, not elsewhere classified  Muscle weakness (generalized)  Chronic bilateral low back pain without sciatica  Muscle spasm of back     Problem List Patient Active Problem List   Diagnosis Date Noted  . IFG (impaired fasting glucose) 03/09/2019  . Localized primary osteoarthritis of carpometacarpal joint of left thumb 03/08/2019  . On statin therapy due to risk of future cardiovascular event 12/01/2017  . Spondylolisthesis at L5-S1 level 08/13/2017  . Colon polyp 05/25/2017  . Ophthalmic migraine 05/25/2017  . Essential tremor 05/25/2017  . Allergic asthma, mild intermittent, uncomplicated 57/32/2025  . Obesity, Class I, BMI 30-34.9 04/16/2015  . Conjunctivitis, allergic, chronic 10/01/2012  . Hypertriglyceridemia 09/22/2011  . Rosacea 06/25/2011  . Irritable bowel syndrome with diarrhea 07/08/2010  . Benign essential hypertension 05/23/2010  . CKD (chronic kidney disease) stage 3, GFR 30-59 ml/min (HCC) 05/23/2010  . Chronic allergic rhinitis 02/25/2010  . GERD (gastroesophageal reflux disease) 02/25/2010   . Psoriasis 02/25/2010  . Osteoarthritis, multiple sites 06/12/2009   Amador Cunas, PT, DPT Donald Prose Pepper Wyndham 04/20/2020, 10:59 AM  [place]  [place]. [place], [name], [address] Phone: [phone]   Fax:  [phone]  Name: Sabrina Ramirez MRN: [id number] Date of Birth: [birth year]

## 2020-04-24 ENCOUNTER — Ambulatory Visit: Payer: Medicare PPO | Admitting: Physical Therapy

## 2020-04-24 ENCOUNTER — Other Ambulatory Visit: Payer: Self-pay

## 2020-04-24 ENCOUNTER — Encounter: Payer: Self-pay | Admitting: Physical Therapy

## 2020-04-24 DIAGNOSIS — M545 Low back pain, unspecified: Secondary | ICD-10-CM | POA: Diagnosis not present

## 2020-04-24 DIAGNOSIS — M62830 Muscle spasm of back: Secondary | ICD-10-CM | POA: Diagnosis not present

## 2020-04-24 DIAGNOSIS — R252 Cramp and spasm: Secondary | ICD-10-CM | POA: Diagnosis not present

## 2020-04-24 DIAGNOSIS — M6281 Muscle weakness (generalized): Secondary | ICD-10-CM

## 2020-04-24 DIAGNOSIS — G8929 Other chronic pain: Secondary | ICD-10-CM

## 2020-04-24 DIAGNOSIS — R262 Difficulty in walking, not elsewhere classified: Secondary | ICD-10-CM

## 2020-04-24 NOTE — Therapy (Signed)
[place]. [place], [name], [address] Phone: [phone]   Fax:  [phone]  Physical Therapy Treatment  Patient Details  Name: Sabrina Ramirez MRN: [id number] Date of Birth: [birth year] Referring Provider (PT): Rockne Coons Date: 04/24/2020   PT End of Session - 04/24/20 1135    Visit Number 6    Date for PT Re-Evaluation 06/04/20    PT Start Time 1100    PT Stop Time 1147    PT Time Calculation (min) 47 min    Activity Tolerance Patient tolerated treatment well    Behavior During Therapy [facility] for tasks assessed/performed           Past Medical History:  Diagnosis Date  . Allergic asthma, mild intermittent, uncomplicated 53/61/4431   Dr. Donneta Romberg, allergist  . Arthritis   . Chronic kidney disease    Stage III kidney disease  . GERD (gastroesophageal reflux disease)   . Hemorrhoids   . History of kidney stones   . Hypertension   . IBS (irritable bowel syndrome)   . IFG (impaired fasting glucose) 03/09/2019   a1c 6.3 02/2019  . Psoriasis     Past Surgical History:  Procedure Laterality Date  . ABDOMINAL HYSTERECTOMY  1987  . CATARACT EXTRACTION W/ INTRAOCULAR LENS  IMPLANT, BILATERAL Bilateral 2015  . CHOLECYSTECTOMY    . HEMORRHOID SURGERY    . SPINE SURGERY  07/10/2017  . TONSILLECTOMY      There were no vitals filed for this visit.   Subjective Assessment - 04/24/20 1105    Subjective Pt reports that she is feeling pretty good, today seems to be a better [place]    Pertinent History lumbar fusion 2019    Limitations Standing;Walking;House hold activities    Currently in Pain? No/denies                             [facility] Adult PT Treatment/Exercise - 04/24/20 0001      Lumbar Exercises: Aerobic   Recumbent Bike L2 x 4 min    Nustep L5 x 5 min      Lumbar Exercises: Machines for Strengthening   Cybex Knee Extension 5lb 2x10    Cybex Knee Flexion 20lb 2x10        Lumbar Exercises: Standing   Row Both;20 reps    Theraband Level (Row) Level 3 (Green)    Shoulder Extension Both;20 reps;Strengthening;Power Tower      Lumbar Exercises: Seated   Sit to Stand 10 reps;5 reps    Sit to Stand Limitations with yellow ball chest press    Other Seated Lumbar Exercises seated ball squeeze 3 sec hold x10    Other Seated Lumbar Exercises Ab set x10      Moist Heat Therapy   Number Minutes Moist Heat 12 Minutes    Moist Heat Location Lumbar Spine      Electrical Stimulation   Electrical Stimulation Location Lumbar    Electrical Stimulation Action IFC    Electrical Stimulation Parameters supine    Electrical Stimulation Goals Pain                    PT Short Term Goals - 04/17/20 1156      PT SHORT TERM GOAL #1   Title Patient independent with HEP    Time 2    Period Weeks    Status Achieved  Target Date 04/18/20             PT Long Term Goals - 04/04/20 1600      PT LONG TERM GOAL #1   Title Pt will be I with advanced HEP    Time 8    Period Weeks    Status New    Target Date 05/30/20      PT LONG TERM GOAL #2   Title Pt will report able to stand to cook meals without sitting to rest    Time 8    Period Weeks    Status New    Target Date 05/30/20      PT LONG TERM GOAL #3   Title Pt will report able to walk >10 min with </= 2/10 LBP    Time 8    Period Weeks    Status New    Target Date 05/30/20      PT LONG TERM GOAL #4   Title Pt will demo BLE strength >/= 4+/5    Time 8    Period Weeks    Status New    Target Date 05/30/20      PT LONG TERM GOAL #5   Title Pt will be able to ambulate one lap around PT building and stairs without needing rest break, to show improved endurance for community ambulation.    Time 8    Period Weeks    Status New    Target Date 05/30/20                 Plan - 04/24/20 1136    Clinical Impression Statement Pt did well overall today. No reports of pain but did have some  muscular fatigue with seated leg extensions unable to complete the second set. Cues given for core engage with standing shoulder extensions. Positive response to modality    Examination-Activity Limitations Bend;Locomotion Level;Stand    Examination-Participation Restrictions Community Activity;Interpersonal Relationship;Cleaning;Meal Prep    Stability/Clinical Decision Making Stable/Uncomplicated    Rehab Potential Good    PT Frequency 2x / week    PT Treatment/Interventions ADLs/Self Care Home Management;Electrical Stimulation;Iontophoresis 4mg /ml Dexamethasone;Moist Heat;Therapeutic exercise;Therapeutic activities;Stair training;Gait training;Patient/family education;Manual techniques    PT Next Visit Plan Continue with core stabilization, LE stregnth/flex. and endurance exercises. Modalities as indicated.           Patient will benefit from skilled therapeutic intervention in order to improve the following deficits and impairments:  Abnormal gait, Decreased range of motion, Difficulty walking, Decreased endurance, Increased muscle spasms, Decreased activity tolerance, Pain, Impaired flexibility, Decreased strength, Postural dysfunction  Visit Diagnosis: Difficulty in walking, not elsewhere classified  Muscle weakness (generalized)  Chronic bilateral low back pain without sciatica     Problem List Patient Active Problem List   Diagnosis Date Noted  . IFG (impaired fasting glucose) 03/09/2019  . Localized primary osteoarthritis of carpometacarpal joint of left thumb 03/08/2019  . On statin therapy due to risk of future cardiovascular event 12/01/2017  . Spondylolisthesis at L5-S1 level 08/13/2017  . Colon polyp 05/25/2017  . Ophthalmic migraine 05/25/2017  . Essential tremor 05/25/2017  . Allergic asthma, mild intermittent, uncomplicated 62/37/6283  . Obesity, Class I, BMI 30-34.9 04/16/2015  . Conjunctivitis, allergic, chronic 10/01/2012  . Hypertriglyceridemia 09/22/2011  .  Rosacea 06/25/2011  . Irritable bowel syndrome with diarrhea 07/08/2010  . Benign essential hypertension 05/23/2010  . CKD (chronic kidney disease) stage 3, GFR 30-59 ml/min (HCC) 05/23/2010  . Chronic allergic rhinitis 02/25/2010  .  GERD (gastroesophageal reflux disease) 02/25/2010  . Psoriasis 02/25/2010  . Osteoarthritis, multiple sites 06/12/2009    Scot Jun, PTA 04/24/2020, 11:38 AM  [place]. [place], [name], [address] Phone: [phone]   Fax:  [phone]  Name: Sabrina Ramirez MRN: [id number] Date of Birth: January 27, 1941

## 2020-04-26 ENCOUNTER — Other Ambulatory Visit: Payer: Self-pay

## 2020-04-26 ENCOUNTER — Ambulatory Visit: Payer: Medicare PPO | Admitting: Physical Therapy

## 2020-04-26 ENCOUNTER — Encounter: Payer: Self-pay | Admitting: Physical Therapy

## 2020-04-26 DIAGNOSIS — M6281 Muscle weakness (generalized): Secondary | ICD-10-CM | POA: Diagnosis not present

## 2020-04-26 DIAGNOSIS — M545 Low back pain, unspecified: Secondary | ICD-10-CM | POA: Diagnosis not present

## 2020-04-26 DIAGNOSIS — R262 Difficulty in walking, not elsewhere classified: Secondary | ICD-10-CM

## 2020-04-26 DIAGNOSIS — G8929 Other chronic pain: Secondary | ICD-10-CM | POA: Diagnosis not present

## 2020-04-26 DIAGNOSIS — M62830 Muscle spasm of back: Secondary | ICD-10-CM | POA: Diagnosis not present

## 2020-04-26 DIAGNOSIS — R252 Cramp and spasm: Secondary | ICD-10-CM | POA: Diagnosis not present

## 2020-04-26 NOTE — Therapy (Signed)
[place]. [place], [name], [address] Phone: [phone]   Fax:  [phone]  Physical Therapy Treatment  Patient Details  Name: Sabrina Ramirez MRN: [id number] Date of Birth: 1941/05/09 Referring Provider (PT): Rockne Coons Date: 04/26/2020   PT End of Session - 04/26/20 1137    Visit Number 7    Date for PT Re-Evaluation 06/04/20    PT Start Time 1100    PT Stop Time 1145    PT Time Calculation (min) 45 min    Activity Tolerance Patient limited by pain    Behavior During Therapy [facility] for tasks assessed/performed           Past Medical History:  Diagnosis Date  . Allergic asthma, mild intermittent, uncomplicated 28/78/6767   Dr. Donneta Romberg, allergist  . Arthritis   . Chronic kidney disease    Stage III kidney disease  . GERD (gastroesophageal reflux disease)   . Hemorrhoids   . History of kidney stones   . Hypertension   . IBS (irritable bowel syndrome)   . IFG (impaired fasting glucose) 03/09/2019   a1c 6.3 02/2019  . Psoriasis     Past Surgical History:  Procedure Laterality Date  . ABDOMINAL HYSTERECTOMY  1987  . CATARACT EXTRACTION W/ INTRAOCULAR LENS  IMPLANT, BILATERAL Bilateral 2015  . CHOLECYSTECTOMY    . HEMORRHOID SURGERY    . SPINE SURGERY  07/10/2017  . TONSILLECTOMY      There were no vitals filed for this visit.   Subjective Assessment - 04/26/20 1101    Subjective "ok" "but not as good as Tuesday, Tuesday was a great [place]"    Currently in Pain? Yes    Pain Score 4     Pain Location Back                             [facility] Adult PT Treatment/Exercise - 04/26/20 0001      Lumbar Exercises: Stretches   Other Lumbar Stretch Exercise Forward flex stretch on ball x10       Lumbar Exercises: Aerobic   Recumbent Bike L2 x 3 min    Nustep L5 x 6 min      Lumbar Exercises: Machines for Strengthening   Leg Press 20lb 2x10     Other Lumbar Machine Exercise  Rows & Lats 20lb 2x10       Lumbar Exercises: Standing   Shoulder Extension Both;20 reps;Strengthening;Power Tower    Shoulder Extension Limitations 5      Moist Heat Therapy   Number Minutes Moist Heat 12 Minutes    Moist Heat Location Lumbar Spine      Electrical Stimulation   Electrical Stimulation Location Lumbar    Electrical Stimulation Action IFC    Electrical Stimulation Parameters supine    Electrical Stimulation Goals Pain                    PT Short Term Goals - 04/17/20 1156      PT SHORT TERM GOAL #1   Title Patient independent with HEP    Time 2    Period Weeks    Status Achieved    Target Date 04/18/20             PT Long Term Goals - 04/04/20 1600      PT LONG TERM GOAL #1   Title Pt will be I with  advanced HEP    Time 8    Period Weeks    Status New    Target Date 05/30/20      PT LONG TERM GOAL #2   Title Pt will report able to stand to cook meals without sitting to rest    Time 8    Period Weeks    Status New    Target Date 05/30/20      PT LONG TERM GOAL #3   Title Pt will report able to walk >10 min with </= 2/10 LBP    Time 8    Period Weeks    Status New    Target Date 05/30/20      PT LONG TERM GOAL #4   Title Pt will demo BLE strength >/= 4+/5    Time 8    Period Weeks    Status New    Target Date 05/30/20      PT LONG TERM GOAL #5   Title Pt will be able to ambulate one lap around PT building and stairs without needing rest break, to show improved endurance for community ambulation.    Time 8    Period Weeks    Status New    Target Date 05/30/20                 Plan - 04/26/20 1138    Clinical Impression Statement Pt reported an increase in her LBP after recumbent bike warm up. Added leg press interventions without issue. With the addition of lat pull downs she stated th stretch felt good on her back. After shoulder ext pt stated another increase in LBP, applied modality for pain     Examination-Activity Limitations Bend;Locomotion Level;Stand    Examination-Participation Restrictions Community Activity;Interpersonal Relationship;Cleaning;Meal Prep    Stability/Clinical Decision Making Stable/Uncomplicated    Rehab Potential Good    PT Frequency 2x / week    PT Duration 8 weeks    PT Treatment/Interventions ADLs/Self Care Home Management;Electrical Stimulation;Iontophoresis 4mg /ml Dexamethasone;Moist Heat;Therapeutic exercise;Therapeutic activities;Stair training;Gait training;Patient/family education;Manual techniques    PT Next Visit Plan Continue with core stabilization, LE stregnth/flex. and endurance exercises. Modalities as indicated.           Patient will benefit from skilled therapeutic intervention in order to improve the following deficits and impairments:  Abnormal gait, Decreased range of motion, Difficulty walking, Decreased endurance, Increased muscle spasms, Decreased activity tolerance, Pain, Impaired flexibility, Decreased strength, Postural dysfunction  Visit Diagnosis: Difficulty in walking, not elsewhere classified  Muscle weakness (generalized)  Chronic bilateral low back pain without sciatica     Problem List Patient Active Problem List   Diagnosis Date Noted  . IFG (impaired fasting glucose) 03/09/2019  . Localized primary osteoarthritis of carpometacarpal joint of left thumb 03/08/2019  . On statin therapy due to risk of future cardiovascular event 12/01/2017  . Spondylolisthesis at L5-S1 level 08/13/2017  . Colon polyp 05/25/2017  . Ophthalmic migraine 05/25/2017  . Essential tremor 05/25/2017  . Allergic asthma, mild intermittent, uncomplicated 47/42/5956  . Obesity, Class I, BMI 30-34.9 04/16/2015  . Conjunctivitis, allergic, chronic 10/01/2012  . Hypertriglyceridemia 09/22/2011  . Rosacea 06/25/2011  . Irritable bowel syndrome with diarrhea 07/08/2010  . Benign essential hypertension 05/23/2010  . CKD (chronic kidney disease)  stage 3, GFR 30-59 ml/min (HCC) 05/23/2010  . Chronic allergic rhinitis 02/25/2010  . GERD (gastroesophageal reflux disease) 02/25/2010  . Psoriasis 02/25/2010  . Osteoarthritis, multiple sites 06/12/2009    Scot Jun, PTA 04/26/2020,  11:42 AM  [place]. [place], [name], [address] Phone: [phone]   Fax:  [phone]  Name: Sabrina Ramirez MRN: [id number] Date of Birth: 11/16/1940

## 2020-05-04 ENCOUNTER — Other Ambulatory Visit: Payer: Self-pay | Admitting: Family Medicine

## 2020-05-09 ENCOUNTER — Ambulatory Visit: Payer: Medicare PPO | Attending: Family Medicine | Admitting: Physical Therapy

## 2020-05-09 ENCOUNTER — Other Ambulatory Visit: Payer: Self-pay

## 2020-05-09 ENCOUNTER — Encounter: Payer: Self-pay | Admitting: Physical Therapy

## 2020-05-09 DIAGNOSIS — G8929 Other chronic pain: Secondary | ICD-10-CM | POA: Diagnosis not present

## 2020-05-09 DIAGNOSIS — M62830 Muscle spasm of back: Secondary | ICD-10-CM | POA: Insufficient documentation

## 2020-05-09 DIAGNOSIS — R262 Difficulty in walking, not elsewhere classified: Secondary | ICD-10-CM | POA: Diagnosis not present

## 2020-05-09 DIAGNOSIS — M6281 Muscle weakness (generalized): Secondary | ICD-10-CM | POA: Diagnosis not present

## 2020-05-09 DIAGNOSIS — R252 Cramp and spasm: Secondary | ICD-10-CM | POA: Diagnosis not present

## 2020-05-09 DIAGNOSIS — M545 Low back pain, unspecified: Secondary | ICD-10-CM | POA: Diagnosis not present

## 2020-05-09 NOTE — Therapy (Signed)
[place]. [place], [name], [address] Phone: [phone]   Fax:  [phone]  Physical Therapy Treatment  Patient Details  Name: Sabrina Ramirez MRN: [id number] Date of Birth: [birth year] Referring Provider (PT): Rockne Coons Date: 05/09/2020   PT End of Session - 05/09/20 1218    Visit Number 8    Date for PT Re-Evaluation 06/04/20    PT Start Time 1147    PT Stop Time 1219    PT Time Calculation (min) 32 min    Activity Tolerance Patient tolerated treatment well    Behavior During Therapy [facility] for tasks assessed/performed           Past Medical History:  Diagnosis Date  . Allergic asthma, mild intermittent, uncomplicated 44/96/7591   Dr. Donneta Romberg, allergist  . Arthritis   . Chronic kidney disease    Stage III kidney disease  . GERD (gastroesophageal reflux disease)   . Hemorrhoids   . History of kidney stones   . Hypertension   . IBS (irritable bowel syndrome)   . IFG (impaired fasting glucose) 03/09/2019   a1c 6.3 02/2019  . Psoriasis     Past Surgical History:  Procedure Laterality Date  . ABDOMINAL HYSTERECTOMY  1987  . CATARACT EXTRACTION W/ INTRAOCULAR LENS  IMPLANT, BILATERAL Bilateral 2015  . CHOLECYSTECTOMY    . HEMORRHOID SURGERY    . SPINE SURGERY  07/10/2017  . TONSILLECTOMY      There were no vitals filed for this visit.   Subjective Assessment - 05/09/20 1153    Subjective "Doing ok"    Currently in Pain? No/denies                             [facility] Adult PT Treatment/Exercise - 05/09/20 0001      Lumbar Exercises: Aerobic   Nustep L5 x 6 min      Lumbar Exercises: Machines for Strengthening   Cybex Knee Extension 5lb 2x10    Cybex Knee Flexion 20lb 2x10     Other Lumbar Machine Exercise Rows & Lats 20lb 2x10       Lumbar Exercises: Standing   Row Both;20 reps    Theraband Level (Row) Level 3 (Green)    Shoulder Extension Both;20  reps;Strengthening;Power Tower    Theraband Level (Shoulder Extension) Level 3 (Green)    Other Standing Lumbar Exercises Red ErR 2x10                     PT Short Term Goals - 04/17/20 1156      PT SHORT TERM GOAL #1   Title Patient independent with HEP    Time 2    Period Weeks    Status Achieved    Target Date 04/18/20             PT Long Term Goals - 05/09/20 1227      PT LONG TERM GOAL #1   Title Pt will be I with advanced HEP    Status Partially Met      PT LONG TERM GOAL #2   Title Pt will report able to stand to cook meals without sitting to rest    Status Partially Met                 Plan - 05/09/20 1224    Clinical Impression Statement Pt express that she did not want  to do much cause she missed last week and dint want to be too sore. No issues with today's interventions. tactile cues needed for posture with seated rows, standing rows, and with standing extensions. Increase UE fatigue reported with external rotation    Examination-Activity Limitations Bend;Locomotion Level;Stand    Examination-Participation Restrictions Community Activity;Interpersonal Relationship;Cleaning;Meal Prep    PT Frequency 2x / week    PT Duration 8 weeks    PT Treatment/Interventions ADLs/Self Care Home Management;Electrical Stimulation;Iontophoresis 87m/ml Dexamethasone;Moist Heat;Therapeutic exercise;Therapeutic activities;Stair training;Gait training;Patient/family education;Manual techniques    PT Next Visit Plan Continue with core stabilization, LE stregnth/flex. and endurance exercises. Modalities as indicated.           Patient will benefit from skilled therapeutic intervention in order to improve the following deficits and impairments:  Abnormal gait, Decreased range of motion, Difficulty walking, Decreased endurance, Increased muscle spasms, Decreased activity tolerance, Pain, Impaired flexibility, Decreased strength, Postural dysfunction  Visit  Diagnosis: Muscle weakness (generalized)  Chronic bilateral low back pain without sciatica  Difficulty in walking, not elsewhere classified     Problem List Patient Active Problem List   Diagnosis Date Noted  . IFG (impaired fasting glucose) 03/09/2019  . Localized primary osteoarthritis of carpometacarpal joint of left thumb 03/08/2019  . On statin therapy due to risk of future cardiovascular event 12/01/2017  . Spondylolisthesis at L5-S1 level 08/13/2017  . Colon polyp 05/25/2017  . Ophthalmic migraine 05/25/2017  . Essential tremor 05/25/2017  . Allergic asthma, mild intermittent, uncomplicated 119/75/8832 . Obesity, Class I, BMI 30-34.9 04/16/2015  . Conjunctivitis, allergic, chronic 10/01/2012  . Hypertriglyceridemia 09/22/2011  . Rosacea 06/25/2011  . Irritable bowel syndrome with diarrhea 07/08/2010  . Benign essential hypertension 05/23/2010  . CKD (chronic kidney disease) stage 3, GFR 30-59 ml/min (HCC) 05/23/2010  . Chronic allergic rhinitis 02/25/2010  . GERD (gastroesophageal reflux disease) 02/25/2010  . Psoriasis 02/25/2010  . Osteoarthritis, multiple sites 06/12/2009    RScot Jun, PTA 05/09/2020, 12:27 PM  [place] [address]Phone: [phone]  Fax:  [phone] Name: Sabrina Ramirez MRN: [id number] of Birth: [birth year]

## 2020-05-11 ENCOUNTER — Ambulatory Visit: Payer: Medicare PPO | Admitting: Physical Therapy

## 2020-05-11 ENCOUNTER — Encounter: Payer: Self-pay | Admitting: Physical Therapy

## 2020-05-11 ENCOUNTER — Other Ambulatory Visit: Payer: Self-pay

## 2020-05-11 DIAGNOSIS — R252 Cramp and spasm: Secondary | ICD-10-CM | POA: Diagnosis not present

## 2020-05-11 DIAGNOSIS — M6281 Muscle weakness (generalized): Secondary | ICD-10-CM

## 2020-05-11 DIAGNOSIS — R262 Difficulty in walking, not elsewhere classified: Secondary | ICD-10-CM

## 2020-05-11 DIAGNOSIS — M62830 Muscle spasm of back: Secondary | ICD-10-CM | POA: Diagnosis not present

## 2020-05-11 DIAGNOSIS — G8929 Other chronic pain: Secondary | ICD-10-CM

## 2020-05-11 DIAGNOSIS — M545 Low back pain, unspecified: Secondary | ICD-10-CM | POA: Diagnosis not present

## 2020-05-11 NOTE — Therapy (Signed)
[place]. [place], [name], [address] Phone: [phone]   Fax:  [phone]  Physical Therapy Treatment  Patient Details  Name: Sabrina Ramirez MRN: [id number] Date of Birth: May 03, 1941 Referring Provider (PT): Rockne Coons Date: 05/11/2020   PT End of Session - 05/11/20 1146    Visit Number 9    Date for PT Re-Evaluation 06/04/20    PT Start Time 1106    PT Stop Time 1140    PT Time Calculation (min) 34 min    Activity Tolerance Patient tolerated treatment well    Behavior During Therapy [facility] for tasks assessed/performed           Past Medical History:  Diagnosis Date  . Allergic asthma, mild intermittent, uncomplicated 24/58/0998   Dr. Donneta Romberg, allergist  . Arthritis   . Chronic kidney disease    Stage III kidney disease  . GERD (gastroesophageal reflux disease)   . Hemorrhoids   . History of kidney stones   . Hypertension   . IBS (irritable bowel syndrome)   . IFG (impaired fasting glucose) 03/09/2019   a1c 6.3 02/2019  . Psoriasis     Past Surgical History:  Procedure Laterality Date  . ABDOMINAL HYSTERECTOMY  1987  . CATARACT EXTRACTION W/ INTRAOCULAR LENS  IMPLANT, BILATERAL Bilateral 2015  . CHOLECYSTECTOMY    . HEMORRHOID SURGERY    . SPINE SURGERY  07/10/2017  . TONSILLECTOMY      There were no vitals filed for this visit.   Subjective Assessment - 05/11/20 1107    Subjective "OK, not a good a I was Wednesday, I don't know what happen Wednesday, I felt good all [place]"    Currently in Pain? No/denies              [facility] PT Assessment - 05/11/20 0001      Strength   Overall Strength Comments BLE strength 5/5 except B hip flex 4/5                         [facility] Adult PT Treatment/Exercise - 05/11/20 0001      Lumbar Exercises: Aerobic   Nustep L4 x 6 min      Lumbar Exercises: Machines for Strengthening   Cybex Knee Extension 5lb 2x10    Cybex Knee  Flexion 20lb 2x10       Lumbar Exercises: Standing   Row Power tower;Both;20 reps    Row Limitations 15lb    Shoulder Extension Both;20 reps;Strengthening;Power Tower    Theraband Level (Shoulder Extension) Level 3 (Green)    Other Standing Lumbar Exercises Red Er 2x10       Lumbar Exercises: Seated   Sit to Stand 10 reps   x2   Other Seated Lumbar Exercises OHP yellow ball 2x10                     PT Short Term Goals - 04/17/20 1156      PT SHORT TERM GOAL #1   Title Patient independent with HEP    Time 2    Period Weeks    Status Achieved    Target Date 04/18/20             PT Long Term Goals - 05/11/20 1115      PT LONG TERM GOAL #4   Title Pt will demo BLE strength >/= 4+/5    Baseline 4/5  hip flexion    Status Partially Met                 Plan - 05/11/20 1146    Clinical Impression Statement Pt ~ 6 minutes late for today's session. She has progressed towards goals increasing her walking tolerance as well as her HE strength. Rest needed during some sets due to muscle fatigue and burning, She reports more back pain on her L side than R with standing rows.    Examination-Activity Limitations Bend;Locomotion Level;Stand    Examination-Participation Restrictions Community Activity;Interpersonal Relationship;Cleaning;Meal Prep    Stability/Clinical Decision Making Stable/Uncomplicated    Rehab Potential Good    PT Frequency 2x / week    PT Duration 8 weeks    PT Treatment/Interventions ADLs/Self Care Home Management;Electrical Stimulation;Iontophoresis 82m/ml Dexamethasone;Moist Heat;Therapeutic exercise;Therapeutic activities;Stair training;Gait training;Patient/family education;Manual techniques    PT Next Visit Plan Continue with core stabilization, LE stregnth/flex. and endurance exercises. Modalities as indicated.           Patient will benefit from skilled therapeutic intervention in order to improve the following deficits and impairments:   Abnormal gait, Decreased range of motion, Difficulty walking, Decreased endurance, Increased muscle spasms, Decreased activity tolerance, Pain, Impaired flexibility, Decreased strength, Postural dysfunction  Visit Diagnosis: Muscle weakness (generalized)  Chronic bilateral low back pain without sciatica  Muscle spasm of back  Difficulty in walking, not elsewhere classified  Cramp and spasm     Problem List Patient Active Problem List   Diagnosis Date Noted  . IFG (impaired fasting glucose) 03/09/2019  . Localized primary osteoarthritis of carpometacarpal joint of left thumb 03/08/2019  . On statin therapy due to risk of future cardiovascular event 12/01/2017  . Spondylolisthesis at L5-S1 level 08/13/2017  . Colon polyp 05/25/2017  . Ophthalmic migraine 05/25/2017  . Essential tremor 05/25/2017  . Allergic asthma, mild intermittent, uncomplicated 144/06/270 . Obesity, Class I, BMI 30-34.9 04/16/2015  . Conjunctivitis, allergic, chronic 10/01/2012  . Hypertriglyceridemia 09/22/2011  . Rosacea 06/25/2011  . Irritable bowel syndrome with diarrhea 07/08/2010  . Benign essential hypertension 05/23/2010  . CKD (chronic kidney disease) stage 3, GFR 30-59 ml/min (HCC) 05/23/2010  . Chronic allergic rhinitis 02/25/2010  . GERD (gastroesophageal reflux disease) 02/25/2010  . Psoriasis 02/25/2010  . Osteoarthritis, multiple sites 06/12/2009    RScot Jun PTA 05/11/2020, 11:50 AM  [place] [address]Phone: [phone]  Fax:  [phone] Name: Sabrina Ramirez MRN: [id number] of Birth: [birth year]

## 2020-05-17 ENCOUNTER — Encounter: Payer: Self-pay | Admitting: Physical Therapy

## 2020-05-17 ENCOUNTER — Ambulatory Visit: Payer: Medicare PPO | Admitting: Physical Therapy

## 2020-05-17 ENCOUNTER — Other Ambulatory Visit: Payer: Self-pay

## 2020-05-17 DIAGNOSIS — M545 Low back pain, unspecified: Secondary | ICD-10-CM | POA: Diagnosis not present

## 2020-05-17 DIAGNOSIS — R252 Cramp and spasm: Secondary | ICD-10-CM | POA: Diagnosis not present

## 2020-05-17 DIAGNOSIS — M6281 Muscle weakness (generalized): Secondary | ICD-10-CM | POA: Diagnosis not present

## 2020-05-17 DIAGNOSIS — G8929 Other chronic pain: Secondary | ICD-10-CM | POA: Diagnosis not present

## 2020-05-17 DIAGNOSIS — M62830 Muscle spasm of back: Secondary | ICD-10-CM | POA: Diagnosis not present

## 2020-05-17 DIAGNOSIS — R262 Difficulty in walking, not elsewhere classified: Secondary | ICD-10-CM | POA: Diagnosis not present

## 2020-05-17 NOTE — Therapy (Signed)
[place]. [place], [name], [address] Phone: [phone]   Fax:  [phone]  Physical Therapy Treatment Progress Note Reporting Period 04/04/2020 to 05/17/2020  See note below for Objective Data and Assessment of Progress/Goals.      Patient Details  Name: Sabrina Ramirez MRN: [id number] Date of Birth: May 11, 1941 Referring Provider (PT): Rockne Coons Date: 05/17/2020   PT End of Session - 05/17/20 1145    Visit Number 10    Date for PT Re-Evaluation 06/04/20    PT Start Time 1111    PT Stop Time 1145    PT Time Calculation (min) 34 min    Activity Tolerance Patient tolerated treatment well    Behavior During Therapy [facility] for tasks assessed/performed           Past Medical History:  Diagnosis Date  . Allergic asthma, mild intermittent, uncomplicated 63/87/5643   Dr. Donneta Romberg, allergist  . Arthritis   . Chronic kidney disease    Stage III kidney disease  . GERD (gastroesophageal reflux disease)   . Hemorrhoids   . History of kidney stones   . Hypertension   . IBS (irritable bowel syndrome)   . IFG (impaired fasting glucose) 03/09/2019   a1c 6.3 02/2019  . Psoriasis     Past Surgical History:  Procedure Laterality Date  . ABDOMINAL HYSTERECTOMY  1987  . CATARACT EXTRACTION W/ INTRAOCULAR LENS  IMPLANT, BILATERAL Bilateral 2015  . CHOLECYSTECTOMY    . HEMORRHOID SURGERY    . SPINE SURGERY  07/10/2017  . TONSILLECTOMY      There were no vitals filed for this visit.   Subjective Assessment - 05/17/20 1116    Subjective Feeling good today; reports general aching    Currently in Pain? No/denies                             [facility] Adult PT Treatment/Exercise - 05/17/20 0001      Lumbar Exercises: Aerobic   UBE (Upper Arm Bike) L2 3 min each      Lumbar Exercises: Machines for Strengthening   Cybex Knee Extension 5lb 2x10    Cybex Knee Flexion 20lb 2x10       Lumbar  Exercises: Standing   Row Power tower;Both;20 reps    Theraband Level (Row) Level 3 (Green)    Shoulder Extension Both;20 reps;Strengthening;Power Tower    Theraband Level (Shoulder Extension) Level 3 (Green)      Lumbar Exercises: Seated   Sit to Stand 10 reps    Other Seated Lumbar Exercises chest press with overhead press                    PT Short Term Goals - 04/17/20 1156      PT SHORT TERM GOAL #1   Title Patient independent with HEP    Time 2    Period Weeks    Status Achieved    Target Date 04/18/20             PT Long Term Goals - 05/17/20 1117      PT LONG TERM GOAL #1   Title Pt will be I with advanced HEP    Status Partially Met      PT LONG TERM GOAL #2   Title Pt will report able to stand to cook meals without sitting to rest    Status Partially Met  PT LONG TERM GOAL #3   Title Pt will report able to walk >10 min with </= 2/10 LBP    Baseline able to walk >15 min if has support like grocery cart    Status Partially Met      PT LONG TERM GOAL #4   Title Pt will demo BLE strength >/= 4+/5    Baseline 4/5  hip flexion    Status Partially Met      PT LONG TERM GOAL #5   Title Pt will be able to ambulate one lap around PT building and stairs without needing rest break, to show improved endurance for community ambulation.    Status On-going                 Plan - 05/17/20 1145    Clinical Impression Statement Pt a few minutes late for today's session. Progressing towards goals with improved endurance, strength, and functional strength for ADLs. Still limited in household ADLs by strength/endurance and requires frequent rest breaks during session and at home. Continue to progress strength/endurance.    PT Treatment/Interventions ADLs/Self Care Home Management;Electrical Stimulation;Iontophoresis 12m/ml Dexamethasone;Moist Heat;Therapeutic exercise;Therapeutic activities;Stair training;Gait training;Patient/family education;Manual  techniques    PT Next Visit Plan Continue with core stabilization, LE stregnth/flex. and endurance exercises. Modalities as indicated.    Consulted and Agree with Plan of Care Patient           Patient will benefit from skilled therapeutic intervention in order to improve the following deficits and impairments:  Abnormal gait,Decreased range of motion,Difficulty walking,Decreased endurance,Increased muscle spasms,Decreased activity tolerance,Pain,Impaired flexibility,Decreased strength,Postural dysfunction  Visit Diagnosis: Muscle weakness (generalized)  Chronic bilateral low back pain without sciatica  Muscle spasm of back  Difficulty in walking, not elsewhere classified  Cramp and spasm     Problem List Patient Active Problem List   Diagnosis Date Noted  . IFG (impaired fasting glucose) 03/09/2019  . Localized primary osteoarthritis of carpometacarpal joint of left thumb 03/08/2019  . On statin therapy due to risk of future cardiovascular event 12/01/2017  . Spondylolisthesis at L5-S1 level 08/13/2017  . Colon polyp 05/25/2017  . Ophthalmic migraine 05/25/2017  . Essential tremor 05/25/2017  . Allergic asthma, mild intermittent, uncomplicated 136/64/4034 . Obesity, Class I, BMI 30-34.9 04/16/2015  . Conjunctivitis, allergic, chronic 10/01/2012  . Hypertriglyceridemia 09/22/2011  . Rosacea 06/25/2011  . Irritable bowel syndrome with diarrhea 07/08/2010  . Benign essential hypertension 05/23/2010  . CKD (chronic kidney disease) stage 3, GFR 30-59 ml/min (HCC) 05/23/2010  . Chronic allergic rhinitis 02/25/2010  . GERD (gastroesophageal reflux disease) 02/25/2010  . Psoriasis 02/25/2010  . Osteoarthritis, multiple sites 06/12/2009   AAmador Cunas PT, DPT ADonald ProseSugg 05/17/2020, 11:47 AM  [place] [address]Phone: [phone]  Fax:  [phone] Name: Sabrina Ramirez MRN:  [id number] of Birth: 1December 14, 1942

## 2020-05-22 ENCOUNTER — Encounter: Payer: Self-pay | Admitting: Physical Therapy

## 2020-05-22 ENCOUNTER — Ambulatory Visit: Payer: Medicare PPO | Admitting: Physical Therapy

## 2020-05-22 ENCOUNTER — Other Ambulatory Visit: Payer: Self-pay

## 2020-05-22 DIAGNOSIS — R262 Difficulty in walking, not elsewhere classified: Secondary | ICD-10-CM

## 2020-05-22 DIAGNOSIS — M62830 Muscle spasm of back: Secondary | ICD-10-CM | POA: Diagnosis not present

## 2020-05-22 DIAGNOSIS — G8929 Other chronic pain: Secondary | ICD-10-CM | POA: Diagnosis not present

## 2020-05-22 DIAGNOSIS — R252 Cramp and spasm: Secondary | ICD-10-CM | POA: Diagnosis not present

## 2020-05-22 DIAGNOSIS — M6281 Muscle weakness (generalized): Secondary | ICD-10-CM

## 2020-05-22 DIAGNOSIS — M545 Low back pain, unspecified: Secondary | ICD-10-CM | POA: Diagnosis not present

## 2020-05-22 NOTE — Therapy (Signed)
[place]. [place], [name], [address] Phone: [phone]   Fax:  [phone]  Physical Therapy Treatment  Patient Details  Name: Sabrina Ramirez MRN: [id number] Date of Birth: 1940/10/08 Referring Provider (PT): Rockne Coons Date: 05/22/2020   PT End of Session - 05/22/20 1144    Visit Number 11    Date for PT Re-Evaluation 06/04/20    PT Start Time 1102    PT Stop Time 1144    PT Time Calculation (min) 42 min    Activity Tolerance Patient tolerated treatment well    Behavior During Therapy [facility] for tasks assessed/performed           Past Medical History:  Diagnosis Date  . Allergic asthma, mild intermittent, uncomplicated 30/16/0109   Dr. Donneta Ramirez, allergist  . Arthritis   . Chronic kidney disease    Stage III kidney disease  . GERD (gastroesophageal reflux disease)   . Hemorrhoids   . History of kidney stones   . Hypertension   . IBS (irritable bowel syndrome)   . IFG (impaired fasting glucose) 03/09/2019   a1c 6.3 02/2019  . Psoriasis     Past Surgical History:  Procedure Laterality Date  . ABDOMINAL HYSTERECTOMY  1987  . CATARACT EXTRACTION W/ INTRAOCULAR LENS  IMPLANT, BILATERAL Bilateral 2015  . CHOLECYSTECTOMY    . HEMORRHOID SURGERY    . SPINE SURGERY  07/10/2017  . TONSILLECTOMY      There were no vitals filed for this visit.   Subjective Assessment - 05/22/20 1106    Subjective Pt reports that her LB is feeling better; main problem is still standing for long periods    Currently in Pain? No/denies    Pain Score 0-No pain    Pain Location Back                             [facility] Adult PT Treatment/Exercise - 05/22/20 0001      Lumbar Exercises: Aerobic   Nustep L4 x 6 min      Lumbar Exercises: Machines for Strengthening   Cybex Knee Extension 5lb 2x10    Cybex Knee Flexion 20lb 2x10     Other Lumbar Machine Exercise rows and lats 15# 2x10    Other  Lumbar Machine Exercise standing shoulder ext 5# 2x10      Lumbar Exercises: Seated   Sit to Stand 10 reps    Sit to Stand Limitations with yellow ball chest press    Other Seated Lumbar Exercises chest press with overhead press yellow ball 1x10                    PT Short Term Goals - 04/17/20 1156      PT SHORT TERM GOAL #1   Title Patient independent with HEP    Time 2    Period Weeks    Status Achieved    Target Date 04/18/20             PT Long Term Goals - 05/17/20 1117      PT LONG TERM GOAL #1   Title Pt will be I with advanced HEP    Status Partially Met      PT LONG TERM GOAL #2   Title Pt will report able to stand to cook meals without sitting to rest    Status Partially Met  PT LONG TERM GOAL #3   Title Pt will report able to walk >10 min with </= 2/10 LBP    Baseline able to walk >15 min if has support like grocery cart    Status Partially Met      PT LONG TERM GOAL #4   Title Pt will demo BLE strength >/= 4+/5    Baseline 4/5  hip flexion    Status Partially Met      PT LONG TERM GOAL #5   Title Pt will be able to ambulate one lap around PT building and stairs without needing rest break, to show improved endurance for community ambulation.    Status On-going                 Plan - 05/22/20 1144    Clinical Impression Statement Prescribed updated HEP to include standing postural/lumbar stab ex's. Educated on need to increase standing tolerance. Pt only able to tolerate a few minutes of standing before experiencing increase in LBP. No reports of increased back pain with seated ex's this rx. Cues for form with standing shoulder extensions.    PT Treatment/Interventions ADLs/Self Care Home Management;Electrical Stimulation;Iontophoresis 81m/ml Dexamethasone;Moist Heat;Therapeutic exercise;Therapeutic activities;Stair training;Gait training;Patient/family education;Manual techniques    PT Next Visit Plan Continue with core  stabilization, LE stregnth/flex. and endurance exercises. Modalities as indicated.    Consulted and Agree with Plan of Care Patient           Patient will benefit from skilled therapeutic intervention in order to improve the following deficits and impairments:  Abnormal gait,Decreased range of motion,Difficulty walking,Decreased endurance,Increased muscle spasms,Decreased activity tolerance,Pain,Impaired flexibility,Decreased strength,Postural dysfunction  Visit Diagnosis: Muscle weakness (generalized)  Chronic bilateral low back pain without sciatica  Muscle spasm of back  Difficulty in walking, not elsewhere classified     Problem List Patient Active Problem List   Diagnosis Date Noted  . IFG (impaired fasting glucose) 03/09/2019  . Localized primary osteoarthritis of carpometacarpal joint of left thumb 03/08/2019  . On statin therapy due to risk of future cardiovascular event 12/01/2017  . Spondylolisthesis at L5-S1 level 08/13/2017  . Colon polyp 05/25/2017  . Ophthalmic migraine 05/25/2017  . Essential tremor 05/25/2017  . Allergic asthma, mild intermittent, uncomplicated 188/89/1694 . Obesity, Class I, BMI 30-34.9 04/16/2015  . Conjunctivitis, allergic, chronic 10/01/2012  . Hypertriglyceridemia 09/22/2011  . Rosacea 06/25/2011  . Irritable bowel syndrome with diarrhea 07/08/2010  . Benign essential hypertension 05/23/2010  . CKD (chronic kidney disease) stage 3, GFR 30-59 ml/min (HCC) 05/23/2010  . Chronic allergic rhinitis 02/25/2010  . GERD (gastroesophageal reflux disease) 02/25/2010  . Psoriasis 02/25/2010  . Osteoarthritis, multiple sites 06/12/2009   AAmador Cunas PT, DPT ADonald ProseSugg 05/22/2020, 11:46 AM  [place] [address]Phone: [phone]  Fax:  [phone] Name: [name] MRN: [id number] of Birth: [birth year]

## 2020-05-22 NOTE — Patient Instructions (Signed)
Access Code: [email or web address] URL: [email or web address] Date: 05/22/2020 Prepared by: Amador Cunas  Exercises Shoulder External Rotation and Scapular Retraction with Resistance - 1 x daily - 7 x weekly - 3 sets - 10 reps Standing Shoulder Row with Anchored Resistance - 1 x daily - 7 x weekly - 3 sets - 10 reps Shoulder Extension with Resistance - 1 x daily - 7 x weekly - 3 sets - 10 reps

## 2020-05-25 ENCOUNTER — Encounter: Payer: Self-pay | Admitting: Physical Therapy

## 2020-05-25 ENCOUNTER — Ambulatory Visit: Payer: Medicare PPO | Admitting: Physical Therapy

## 2020-05-25 ENCOUNTER — Other Ambulatory Visit: Payer: Self-pay

## 2020-05-25 DIAGNOSIS — R262 Difficulty in walking, not elsewhere classified: Secondary | ICD-10-CM | POA: Diagnosis not present

## 2020-05-25 DIAGNOSIS — G8929 Other chronic pain: Secondary | ICD-10-CM

## 2020-05-25 DIAGNOSIS — M6281 Muscle weakness (generalized): Secondary | ICD-10-CM

## 2020-05-25 DIAGNOSIS — R252 Cramp and spasm: Secondary | ICD-10-CM | POA: Diagnosis not present

## 2020-05-25 DIAGNOSIS — M62830 Muscle spasm of back: Secondary | ICD-10-CM | POA: Diagnosis not present

## 2020-05-25 DIAGNOSIS — M545 Low back pain, unspecified: Secondary | ICD-10-CM

## 2020-05-25 NOTE — Therapy (Signed)
[place]. [place], [name], [address] Phone: [phone]   Fax:  [phone]  Physical Therapy Treatment  Patient Details  Name: Sabrina Ramirez MRN: [id number] Date of Birth: [birth year] Referring Provider (PT): Rockne Coons Date: 05/25/2020   PT End of Session - 05/25/20 1056    Visit Number 12    Date for PT Re-Evaluation 06/04/20    PT Start Time 1018    PT Stop Time 1058    PT Time Calculation (min) 40 min    Activity Tolerance Patient tolerated treatment well    Behavior During Therapy [facility] for tasks assessed/performed           Past Medical History:  Diagnosis Date  . Allergic asthma, mild intermittent, uncomplicated 84/66/5993   Dr. Donneta Romberg, allergist  . Arthritis   . Chronic kidney disease    Stage III kidney disease  . GERD (gastroesophageal reflux disease)   . Hemorrhoids   . History of kidney stones   . Hypertension   . IBS (irritable bowel syndrome)   . IFG (impaired fasting glucose) 03/09/2019   a1c 6.3 02/2019  . Psoriasis     Past Surgical History:  Procedure Laterality Date  . ABDOMINAL HYSTERECTOMY  1987  . CATARACT EXTRACTION W/ INTRAOCULAR LENS  IMPLANT, BILATERAL Bilateral 2015  . CHOLECYSTECTOMY    . HEMORRHOID SURGERY    . SPINE SURGERY  07/10/2017  . TONSILLECTOMY      There were no vitals filed for this visit.   Subjective Assessment - 05/25/20 1022    Subjective Pt reports that LB is feeling good today; went shopping and had some trouble standing for long periods    Currently in Pain? Yes    Pain Score 2     Pain Location Back                             [facility] Adult PT Treatment/Exercise - 05/25/20 0001      Lumbar Exercises: Aerobic   Nustep L5 x 7 min      Lumbar Exercises: Machines for Strengthening   Other Lumbar Machine Exercise rows and lats 15# 2x10    Other Lumbar Machine Exercise standing shoulder ext 5# 2x10      Lumbar  Exercises: Standing   Heel Raises 10 reps    Other Standing Lumbar Exercises resisted gait 20# x4 fwd/sidestepping      Lumbar Exercises: Seated   Other Seated Lumbar Exercises ball squeeze x15 3 sec hold; iso abs with exercise ball x10 3 sec hold                    PT Short Term Goals - 04/17/20 1156      PT SHORT TERM GOAL #1   Title Patient independent with HEP    Time 2    Period Weeks    Status Achieved    Target Date 04/18/20             PT Long Term Goals - 05/17/20 1117      PT LONG TERM GOAL #1   Title Pt will be I with advanced HEP    Status Partially Met      PT LONG TERM GOAL #2   Title Pt will report able to stand to cook meals without sitting to rest    Status Partially Met      PT  LONG TERM GOAL #3   Title Pt will report able to walk >10 min with </= 2/10 LBP    Baseline able to walk >15 min if has support like grocery cart    Status Partially Met      PT LONG TERM GOAL #4   Title Pt will demo BLE strength >/= 4+/5    Baseline 4/5  hip flexion    Status Partially Met      PT LONG TERM GOAL #5   Title Pt will be able to ambulate one lap around PT building and stairs without needing rest break, to show improved endurance for community ambulation.    Status On-going                 Plan - 05/25/20 1057    Clinical Impression Statement Pt able to tolerate increased number of standing ex's this rx without needing break; working on endurance. Cues for posture/form with standing shoulder extensions. Educated on need to increase standing tolerance at home with pt VU. Visible core weakness with postural ex's. Did well with seated back ex's.    PT Treatment/Interventions ADLs/Self Care Home Management;Electrical Stimulation;Iontophoresis 70m/ml Dexamethasone;Moist Heat;Therapeutic exercise;Therapeutic activities;Stair training;Gait training;Patient/family education;Manual techniques    PT Next Visit Plan Continue with core stabilization, LE  stregnth/flex. and endurance exercises. Modalities as indicated.    Consulted and Agree with Plan of Care Patient           Patient will benefit from skilled therapeutic intervention in order to improve the following deficits and impairments:  Abnormal gait,Decreased range of motion,Difficulty walking,Decreased endurance,Increased muscle spasms,Decreased activity tolerance,Pain,Impaired flexibility,Decreased strength,Postural dysfunction  Visit Diagnosis: Muscle weakness (generalized)  Chronic bilateral low back pain without sciatica  Muscle spasm of back  Difficulty in walking, not elsewhere classified     Problem List Patient Active Problem List   Diagnosis Date Noted  . IFG (impaired fasting glucose) 03/09/2019  . Localized primary osteoarthritis of carpometacarpal joint of left thumb 03/08/2019  . On statin therapy due to risk of future cardiovascular event 12/01/2017  . Spondylolisthesis at L5-S1 level 08/13/2017  . Colon polyp 05/25/2017  . Ophthalmic migraine 05/25/2017  . Essential tremor 05/25/2017  . Allergic asthma, mild intermittent, uncomplicated 103/55/9741 . Obesity, Class I, BMI 30-34.9 04/16/2015  . Conjunctivitis, allergic, chronic 10/01/2012  . Hypertriglyceridemia 09/22/2011  . Rosacea 06/25/2011  . Irritable bowel syndrome with diarrhea 07/08/2010  . Benign essential hypertension 05/23/2010  . CKD (chronic kidney disease) stage 3, GFR 30-59 ml/min (HCC) 05/23/2010  . Chronic allergic rhinitis 02/25/2010  . GERD (gastroesophageal reflux disease) 02/25/2010  . Psoriasis 02/25/2010  . Osteoarthritis, multiple sites 06/12/2009   Sabrina Ramirez PT, Sabrina Ramirez 05/25/2020, 11:00 AM  Sabrina Ramirez [id number]: [phone]  Fax:  [phone] Name: Sabrina Ramirez MRN: [id number] of Birth: 1July 14, 1942

## 2020-06-05 ENCOUNTER — Ambulatory Visit: Payer: Medicare PPO | Admitting: Physical Therapy

## 2020-06-07 ENCOUNTER — Ambulatory Visit: Payer: Medicare PPO | Admitting: Physical Therapy

## 2020-06-12 ENCOUNTER — Ambulatory Visit: Payer: Medicare PPO | Admitting: Physical Therapy

## 2020-06-15 ENCOUNTER — Ambulatory Visit: Payer: Medicare PPO | Attending: Family Medicine | Admitting: Physical Therapy

## 2020-06-15 ENCOUNTER — Other Ambulatory Visit: Payer: Self-pay

## 2020-06-15 ENCOUNTER — Encounter: Payer: Self-pay | Admitting: Physical Therapy

## 2020-06-15 DIAGNOSIS — M6281 Muscle weakness (generalized): Secondary | ICD-10-CM | POA: Insufficient documentation

## 2020-06-15 DIAGNOSIS — M62830 Muscle spasm of back: Secondary | ICD-10-CM

## 2020-06-15 DIAGNOSIS — G8929 Other chronic pain: Secondary | ICD-10-CM | POA: Insufficient documentation

## 2020-06-15 DIAGNOSIS — M545 Low back pain, unspecified: Secondary | ICD-10-CM | POA: Insufficient documentation

## 2020-06-15 NOTE — Therapy (Signed)
[place]. [place], [name], [address] Phone: [phone]   Fax:  [phone]  Physical Therapy Treatment  Patient Details  Name: Sabrina Ramirez MRN: [id number] Date of Birth: [birth year] Referring Provider (PT): Rockne Coons Date: 06/15/2020   PT End of Session - 06/15/20 1116    Visit Number 13    Date for PT Re-Evaluation 06/04/20    PT Start Time 1106    PT Stop Time 1121    PT Time Calculation (min) 15 min    Activity Tolerance Patient tolerated treatment well    Behavior During Therapy [facility] for tasks assessed/performed           Past Medical History:  Diagnosis Date  . Allergic asthma, mild intermittent, uncomplicated 11/57/2620   Dr. Donneta Romberg, allergist  . Arthritis   . Chronic kidney disease    Stage III kidney disease  . GERD (gastroesophageal reflux disease)   . Hemorrhoids   . History of kidney stones   . Hypertension   . IBS (irritable bowel syndrome)   . IFG (impaired fasting glucose) 03/09/2019   a1c 6.3 02/2019  . Psoriasis     Past Surgical History:  Procedure Laterality Date  . ABDOMINAL HYSTERECTOMY  1987  . CATARACT EXTRACTION W/ INTRAOCULAR LENS  IMPLANT, BILATERAL Bilateral 2015  . CHOLECYSTECTOMY    . HEMORRHOID SURGERY    . SPINE SURGERY  07/10/2017  . TONSILLECTOMY      There were no vitals filed for this visit.   Subjective Assessment - 06/15/20 1110    Subjective Pt  reports that she was exposed to Covid 19 [place], was not tested but had upper raspatory symptoms. Was advised to isolate from her MD. Doing better than when she started overall. Has made lifestyle adjustment to compensate for her lack of ability to stand long.    Currently in Pain? No/denies                                       PT Short Term Goals - 04/17/20 1156      PT SHORT TERM GOAL #1   Title Patient independent with HEP    Time 2    Period Weeks    Status  Achieved    Target Date 04/18/20             PT Long Term Goals - 05/17/20 1117      PT LONG TERM GOAL #1   Title Pt will be I with advanced HEP    Status Partially Met      PT LONG TERM GOAL #2   Title Pt will report able to stand to cook meals without sitting to rest    Status Partially Met      PT LONG TERM GOAL #3   Title Pt will report able to walk >10 min with </= 2/10 LBP    Baseline able to walk >15 min if has support like grocery cart    Status Partially Met      PT LONG TERM GOAL #4   Title Pt will demo BLE strength >/= 4+/5    Baseline 4/5  hip flexion    Status Partially Met      PT LONG TERM GOAL #5   Title Pt will be able to ambulate one lap around PT building and stairs without needing  rest break, to show improved endurance for community ambulation.    Status On-going                 Plan - 06/15/20 1121    Clinical Impression Statement Pt returned to therapy to therapy after being exposed to Covid 19 and isolating. She reports no functional limitations at home. She does report not being able to stand for expended periods of time, but has made lifestyle modifications to compensate.    Examination-Activity Limitations [place];Locomotion Level;Stand    Examination-Participation Restrictions Community Activity;Interpersonal Relationship;Cleaning;Meal Prep    Rehab Potential Good    PT Frequency 2x / week    PT Treatment/Interventions ADLs/Self Care Home Management;Electrical Stimulation;Iontophoresis 56m/ml Dexamethasone;Moist Heat;Therapeutic exercise;Therapeutic activities;Stair training;Gait training;Patient/family education;Manual techniques    PT Next Visit Plan D/C PT           Patient will benefit from skilled therapeutic intervention in order to improve the following deficits and impairments:  Abnormal gait,Decreased range of motion,Difficulty walking,Decreased endurance,Increased muscle spasms,Decreased activity tolerance,Pain,Impaired  flexibility,Decreased strength,Postural dysfunction  Visit Diagnosis: Chronic bilateral low back pain without sciatica  Muscle spasm of back  Muscle weakness (generalized)     Problem List Patient Active Problem List   Diagnosis Date Noted  . IFG (impaired fasting glucose) 03/09/2019  . Localized primary osteoarthritis of carpometacarpal joint of left thumb 03/08/2019  . On statin therapy due to risk of future cardiovascular event 12/01/2017  . Spondylolisthesis at L5-S1 level 08/13/2017  . Colon polyp 05/25/2017  . Ophthalmic migraine 05/25/2017  . Essential tremor 05/25/2017  . Allergic asthma, mild intermittent, uncomplicated 117/61/6073 . Obesity, Class I, BMI 30-34.9 04/16/2015  . Conjunctivitis, allergic, chronic 10/01/2012  . Hypertriglyceridemia 09/22/2011  . Rosacea 06/25/2011  . Irritable bowel syndrome with diarrhea 07/08/2010  . Benign essential hypertension 05/23/2010  . CKD (chronic kidney disease) stage 3, GFR 30-59 ml/min (HCC) 05/23/2010  . Chronic allergic rhinitis 02/25/2010  . GERD (gastroesophageal reflux disease) 02/25/2010  . Psoriasis 02/25/2010  . Osteoarthritis, multiple sites 06/12/2009   PHYSICAL THERAPY DISCHARGE SUMMARY  Visits from Start of Care: 13  Plan: Patient agrees to discharge.  Patient goals were partially met. Patient is being discharged due to being pleased with the current functional level.  ?????     RScot Jun PTA 06/15/2020, 11:25 AM  [place] [address]Phone: [phone]  Fax:  [phone] Name: ETrisha KenDay MRN: [id number] of Birth: 106-Mar-1942

## 2020-07-30 DIAGNOSIS — H43813 Vitreous degeneration, bilateral: Secondary | ICD-10-CM | POA: Diagnosis not present

## 2020-07-30 DIAGNOSIS — H3500 Unspecified background retinopathy: Secondary | ICD-10-CM | POA: Diagnosis not present

## 2020-07-30 DIAGNOSIS — H04123 Dry eye syndrome of bilateral lacrimal glands: Secondary | ICD-10-CM | POA: Diagnosis not present

## 2020-07-30 DIAGNOSIS — H26491 Other secondary cataract, right eye: Secondary | ICD-10-CM | POA: Diagnosis not present

## 2020-09-14 DIAGNOSIS — J301 Allergic rhinitis due to pollen: Secondary | ICD-10-CM | POA: Diagnosis not present

## 2020-09-14 DIAGNOSIS — K219 Gastro-esophageal reflux disease without esophagitis: Secondary | ICD-10-CM | POA: Diagnosis not present

## 2020-09-14 DIAGNOSIS — J3089 Other allergic rhinitis: Secondary | ICD-10-CM | POA: Diagnosis not present

## 2020-09-14 DIAGNOSIS — R059 Cough, unspecified: Secondary | ICD-10-CM | POA: Diagnosis not present

## 2020-09-19 ENCOUNTER — Other Ambulatory Visit: Payer: Self-pay

## 2020-09-19 ENCOUNTER — Encounter: Payer: Self-pay | Admitting: Family Medicine

## 2020-09-19 ENCOUNTER — Ambulatory Visit: Payer: Medicare PPO | Admitting: Family Medicine

## 2020-09-19 VITALS — BP 120/70 | HR 81 | Temp 97.7°F | Ht 64.0 in | Wt 188.4 lb

## 2020-09-19 DIAGNOSIS — I10 Essential (primary) hypertension: Secondary | ICD-10-CM

## 2020-09-19 DIAGNOSIS — J4530 Mild persistent asthma, uncomplicated: Secondary | ICD-10-CM | POA: Diagnosis not present

## 2020-09-19 DIAGNOSIS — G250 Essential tremor: Secondary | ICD-10-CM

## 2020-09-19 DIAGNOSIS — M8949 Other hypertrophic osteoarthropathy, multiple sites: Secondary | ICD-10-CM | POA: Diagnosis not present

## 2020-09-19 DIAGNOSIS — R7301 Impaired fasting glucose: Secondary | ICD-10-CM

## 2020-09-19 DIAGNOSIS — N1831 Chronic kidney disease, stage 3a: Secondary | ICD-10-CM

## 2020-09-19 DIAGNOSIS — Z79899 Other long term (current) drug therapy: Secondary | ICD-10-CM | POA: Diagnosis not present

## 2020-09-19 DIAGNOSIS — M159 Polyosteoarthritis, unspecified: Secondary | ICD-10-CM

## 2020-09-19 LAB — POCT GLYCOSYLATED HEMOGLOBIN (HGB A1C): Hemoglobin A1C: 6.1 % — AB (ref 4.0–5.6)

## 2020-09-19 MED ORDER — ROSUVASTATIN CALCIUM 5 MG PO TABS: 2.5000 mg | ORAL_TABLET | Freq: Every evening | ORAL | 3 refills | Status: DC

## 2020-09-19 NOTE — Progress Notes (Signed)
Subjective  CC:  Chief Complaint  Patient presents with  . Hypertension  . Immunizations    Wanting to know if she should get 4th COVID shot   Outpatient Encounter Medications as of 09/19/2020  Medication Sig Note  . Albuterol Sulfate (PROAIR RESPICLICK) [id number] (90 Base) MCG/ACT AEPB Inhale 1-2 puffs into the lungs every 6 (six) hours as needed (for wheezing/shortness of breath).    Marland Kitchen aspirin EC 81 MG tablet Take 1 tablet (81 mg total) by mouth at bedtime.   Marland Kitchen azelastine (ASTELIN) 0.1 % nasal spray Place 1 spray into the nose daily as needed (for allergies.).  07/28/2017: Sparingly   . cetirizine (ZYRTEC) 10 MG tablet Take 10 mg by mouth daily.   . Cholecalciferol (VITAMIN D3) 2000 units TABS Take 2,000 Units by mouth daily.   Marland Kitchen CINNAMON PO Take [id number] applicators by mouth daily. 1/4 teaspoon in coffee grounds   . diclofenac sodium (VOLTAREN) 1 % GEL Apply 2 g topically 4 (four) times daily.   . fluocinonide cream (LIDEX) 0.05 % APPLY SPARINGLY TO THE AFFECTED AREA(S) 3 TIMES DAILY FOR PSORIASIS FLARES. DO NOT USE LONGER THAN 2 WEEKS   . fluticasone furoate-vilanterol (BREO ELLIPTA) 200-25 MCG/INH AEPB Inhale 1 puff into the lungs daily.   . fluticasone furoate-vilanterol (BREO ELLIPTA) 200-25 MCG/INH AEPB Inhale 1 puff into the lungs daily.   . Glucosamine-Chondroitin (COSAMIN DS PO) Take 2 tablets by mouth daily.   . Guaifenesin 1200 MG TB12 Take 1 tablet by mouth as needed. 03/23/2020: As needed  . metoprolol succinate (TOPROL-XL) 100 MG 24 hr tablet Take 1 tablet by mouth once daily   . montelukast (SINGULAIR) 10 MG tablet Take 10 mg by mouth at bedtime.    . Multiple Vitamin (MULTIVITAMIN) tablet Take 1 tablet by mouth daily. Senior Multivitamin   . omeprazole (PRILOSEC) 20 MG capsule Take 20 mg by mouth daily.   Marland Kitchen triamcinolone cream (KENALOG) 0.1 % Apply 1 application topically 2 (two) times daily as needed (for skin rash (Summertime)).    . Turmeric POWD Take by mouth.   .  [DISCONTINUED] rosuvastatin (CRESTOR) 5 MG tablet TAKE 1/2 (ONE-HALF) TABLET BY MOUTH AT BEDTIME   . hydrochlorothiazide (HYDRODIURIL) 25 MG tablet Take 0.5 tablets (12.5 mg total) by mouth daily. (Patient not taking: Reported on 09/19/2020)   . rosuvastatin (CRESTOR) 5 MG tablet Take 0.5 tablets (2.5 mg total) by mouth every evening.    No facility-administered encounter medications on file as of 09/19/2020.    HPI: Sabrina Ramirez is a 80 y.o. female who presents to the office today for follow up of diabetes and problems listed above in the chief complaint.   Hypertension follow-up: Patient is taking metoprolol XL 100 daily.  She does not believe she is taking HCTZ 12.5 mg daily anymore.  Not sure when she stopped.  He has not checked her blood pressures.  No chest pain or shortness of breath.  No adverse effects.  IFG follow up: No symptoms of hyperglycemia.  Start Metformin watchers.  Chronic kidney disease: No lower extremity edema nausea or abdominal pain  Hyperlipidemia: Tolerates her statin 2.5 mg Crestor nightly.  Gets myalgias with higher dose.  Allergic asthma: Reviewed recent allergy and asthma notes.  Add Breo to maintain her symptoms.  Multiple allergy medicines.  All stable by report.  Health maintenance: Due for mammogram, patient to schedule   Wt Readings from Last 3 Encounters:  09/19/20 188 lb 6.4 oz (85.5 kg)  03/27/20  192 lb 6.4 oz (87.3 kg)  09/21/19 193 lb (87.5 kg)    BP Readings from Last 3 Encounters:  09/19/20 120/70  03/27/20 (!) 142/80  09/21/19 140/80   Lab Results  Component Value Date   HGBA1C 6.3 (H) 03/27/2020   HGBA1C 6.1 (A) 09/21/2019   HGBA1C 6.3 03/09/2019      Assessment  1. Benign essential hypertension   2. Stage 3a chronic kidney disease ([place])   3. IFG (impaired fasting glucose)   4. Essential tremor   5. On statin therapy due to risk of future cardiovascular event   6. Primary osteoarthritis involving multiple joints   7.  Allergic asthma, mild persistent, uncomplicated      Plan   Hypertension: Good control.  Patient to start checking blood pressures at home.  If elevates, can restart HCTZ 12.5 daily.  Otherwise continue Toprol XL 100 daily.  Stage III chronic kidney disease: Monitoring.  No symptoms.  Avoid nephrotoxins  Impaired fasting glucose is stable continue diet and weight loss  Tremor and osteoarthritis are stable.  Aches and pains are manageable.  Tremors microcytic.  Follow up: 6 months for complete physical.. No orders of the defined types were placed in this encounter.  Meds ordered this encounter  Medications  . rosuvastatin (CRESTOR) 5 MG tablet    Sig: Take 0.5 tablets (2.5 mg total) by mouth every evening.    Dispense:  45 tablet    Refill:  3      Immunization History  Administered Date(s) Administered  . Fluad Quad(high Dose 65+) 03/08/2019, 03/27/2020  . Influenza, High Dose Seasonal PF 03/02/2012, 04/06/2013, 04/21/2014, 04/16/2015, 03/06/2016, 04/09/2017, 03/01/2018  . Influenza-Unspecified 04/12/2011  . PFIZER(Purple Top)SARS-COV-2 Vaccination 07/02/2019, 07/23/2019, 03/06/2020  . Pneumococcal Conjugate-13 10/05/2014  . Pneumococcal Polysaccharide-23 04/23/2010  . Pneumococcal-Unspecified 04/23/2010  . Tdap 05/23/2010  . Zoster 06/09/2004  . Zoster Recombinat (Shingrix) 11/18/2017, 03/23/2018    Diabetes Related Lab Review: Lab Results  Component Value Date   HGBA1C 6.3 (H) 03/27/2020   HGBA1C 6.1 (A) 09/21/2019   HGBA1C 6.3 03/09/2019    No results found for: Derl Barrow Lab Results  Component Value Date   CREATININE 1.81 (H) 03/27/2020   BUN 28 (H) 03/27/2020   NA 141 03/27/2020   K 4.5 03/27/2020   CL 106 03/27/2020   CO2 26 03/27/2020   Lab Results  Component Value Date   CHOL 137 03/27/2020   CHOL 124 03/08/2019   CHOL 136 03/01/2018   Lab Results  Component Value Date   HDL 43 (L) 03/27/2020   HDL 41.80 03/08/2019   HDL 49.20  03/01/2018   Lab Results  Component Value Date   LDLCALC 67 03/27/2020   LDLCALC 50 03/08/2019   LDLCALC 52 03/01/2018   Lab Results  Component Value Date   TRIG 199 (H) 03/27/2020   TRIG 159.0 (H) 03/08/2019   TRIG 178.0 (H) 03/01/2018   Lab Results  Component Value Date   CHOLHDL 3.2 03/27/2020   CHOLHDL 3 03/08/2019   CHOLHDL 3 03/01/2018   No results found for: LDLDIRECT The 10-year ASCVD risk score Mikey Bussing DC Jr., et al., 2013) is: 46.3%   Values used to calculate the score:     Age: 40 years     Sex: Female     Is Non-Hispanic African American: No     Diabetic: Yes     Tobacco smoker: No     Systolic Blood Pressure: 123456 mmHg     Is  BP treated: Yes     HDL Cholesterol: 43 mg/dL     Total Cholesterol: 137 mg/dL I have reviewed the PMH, Fam and Soc history. Patient Active Problem List   Diagnosis Date Noted  . IFG (impaired fasting glucose) 03/09/2019    Priority: High    a1c 6.3 02/2019   . Obesity, Class I, BMI 30-34.9 04/16/2015    Priority: High  . Hypertriglyceridemia 09/22/2011    Priority: High  . Benign essential hypertension 05/23/2010    Priority: High  . CKD (chronic kidney disease) stage 3, GFR 30-59 ml/min (HCC) 05/23/2010    Priority: High    Overview:  Nl SPEP,UPEP and mild cortical thinning on renal ultrasound 2014   . Localized primary osteoarthritis of carpometacarpal joint of left thumb 03/08/2019    Priority: Medium  . On statin therapy due to risk of future cardiovascular event 12/01/2017    Priority: Medium  . Spondylolisthesis at L5-S1 level 08/13/2017    Priority: Medium  . Colon polyp 05/25/2017    Priority: Medium    Overview:  1968, none on subsequent colonoscopy, [facility], last 2012   . Ophthalmic migraine 05/25/2017    Priority: Medium  . Essential tremor 05/25/2017    Priority: Medium  . Allergic asthma, mild persistent, uncomplicated 0000000    Priority: Medium    Dr. Donneta Romberg, allergist   . Irritable bowel syndrome with  diarrhea 07/08/2010    Priority: Medium  . Chronic allergic rhinitis 02/25/2010    Priority: Medium  . GERD (gastroesophageal reflux disease) 02/25/2010    Priority: Medium    Overview:  On chronic PPI since early 2000s. Neg H.pylor   . Osteoarthritis, multiple sites 06/12/2009    Priority: Medium  . Conjunctivitis, allergic, chronic 10/01/2012    Priority: Low  . Rosacea 06/25/2011    Priority: Low  . Psoriasis 02/25/2010    Priority: Low    Social History: Patient  reports that she has never smoked. She has never used smokeless tobacco. She reports current alcohol use. She reports that she does not use drugs.  Review of Systems: Ophthalmic: negative for eye pain, loss of vision or double vision Cardiovascular: negative for chest pain Respiratory: negative for SOB or persistent cough Gastrointestinal: negative for abdominal pain Genitourinary: negative for dysuria or gross hematuria MSK: negative for foot lesions Neurologic: negative for weakness or gait disturbance  Objective  Vitals: BP 120/70   Pulse 81   Temp 97.7 F (36.5 C) (Temporal)   Ht '5\' 4"'$  (1.626 m)   Wt 188 lb 6.4 oz (85.5 kg)   SpO2 97%   BMI 32.34 kg/m  General: well appearing, no acute distress  Psych:  Alert and oriented, normal mood and affect HEENT:  Normocephalic, atraumatic, moist mucous membranes, supple neck  Cardiovascular:  Nl S1 and S2, RRR without murmur, gallop or rub. no edema Respiratory:  Good breath sounds bilaterally, CTAB with normal effort, no rales     Diabetic education: ongoing education regarding chronic disease management for diabetes was given today. We continue to reinforce the ABC's of diabetic management: A1c (<7 or 8 dependent upon patient), tight blood pressure control, and cholesterol management with goal LDL < 100 minimally. We discuss diet strategies, exercise recommendations, medication options and possible side effects. At each visit, we review recommended  immunizations and preventive care recommendations for diabetics and stress that good diabetic control can prevent other problems. See below for this patient's data.    Commons side effects, risks, benefits,  and alternatives for medications and treatment plan prescribed today were discussed, and the patient expressed understanding of the given instructions. Patient is instructed to call or message via MyChart if he/she has any questions or concerns regarding our treatment plan. No barriers to understanding were identified. We discussed Red Flag symptoms and signs in detail. Patient expressed understanding regarding what to do in case of urgent or emergency type symptoms.   Medication list was reconciled, printed and provided to the patient in AVS. Patient instructions and summary information was reviewed with the patient as documented in the AVS. This note was prepared with assistance of Dragon voice recognition software. Occasional wrong-word or sound-a-like substitutions may have occurred due to the inherent limitations of voice recognition software  This visit occurred during the SARS-CoV-2 public health emergency.  Safety protocols were in place, including screening questions prior to the visit, additional usage of staff PPE, and extensive cleaning of exam room while observing appropriate contact time as indicated for disinfecting solutions.

## 2020-09-19 NOTE — Addendum Note (Signed)
Addended by: Doran Clay A on: 09/19/2020 12:47 PM   Modules accepted: Orders

## 2020-09-19 NOTE — Patient Instructions (Signed)
Please return in October 2022 for your annual complete physical; please come fasting.  Monitor your blood pressures at home: if they are running > 135/85  Consistently, then please restart the HCTZ. (let me know and I will reorder). No other changes are needed today.   Enjoy your beach trip.   If you have any questions or concerns, please don't hesitate to send me a message via MyChart or call the office at [phone]. Thank you for visiting with [place] today! It's our pleasure caring for you.

## 2020-09-25 ENCOUNTER — Ambulatory Visit: Payer: Medicare PPO | Admitting: Family Medicine

## 2020-09-26 ENCOUNTER — Telehealth: Payer: Self-pay

## 2020-09-26 NOTE — Telephone Encounter (Signed)
hydrochlorothiazide (HYDRODIURIL) 25 MG tablet  Pt states that she is still taking this medication. She accidentally told you last week that she was not taking it.

## 2020-09-26 NOTE — Telephone Encounter (Signed)
Med list is UTD

## 2020-10-22 DIAGNOSIS — M1990 Unspecified osteoarthritis, unspecified site: Secondary | ICD-10-CM | POA: Diagnosis not present

## 2020-10-22 DIAGNOSIS — I739 Peripheral vascular disease, unspecified: Secondary | ICD-10-CM | POA: Diagnosis not present

## 2020-10-22 DIAGNOSIS — J309 Allergic rhinitis, unspecified: Secondary | ICD-10-CM | POA: Diagnosis not present

## 2020-10-22 DIAGNOSIS — E669 Obesity, unspecified: Secondary | ICD-10-CM | POA: Diagnosis not present

## 2020-10-22 DIAGNOSIS — I10 Essential (primary) hypertension: Secondary | ICD-10-CM | POA: Diagnosis not present

## 2020-10-22 DIAGNOSIS — G8929 Other chronic pain: Secondary | ICD-10-CM | POA: Diagnosis not present

## 2020-10-22 DIAGNOSIS — K219 Gastro-esophageal reflux disease without esophagitis: Secondary | ICD-10-CM | POA: Diagnosis not present

## 2020-10-22 DIAGNOSIS — E785 Hyperlipidemia, unspecified: Secondary | ICD-10-CM | POA: Diagnosis not present

## 2020-10-22 DIAGNOSIS — L409 Psoriasis, unspecified: Secondary | ICD-10-CM | POA: Diagnosis not present

## 2020-11-02 ENCOUNTER — Other Ambulatory Visit: Payer: Self-pay | Admitting: Family Medicine

## 2020-11-02 DIAGNOSIS — Z1231 Encounter for screening mammogram for malignant neoplasm of breast: Secondary | ICD-10-CM

## 2020-11-07 ENCOUNTER — Ambulatory Visit
Admission: RE | Admit: 2020-11-07 | Discharge: 2020-11-07 | Disposition: A | Discharge status: Home / Self Care | Payer: Medicare PPO | Source: Ambulatory Visit | Attending: Family Medicine | Admitting: Family Medicine

## 2020-11-07 ENCOUNTER — Other Ambulatory Visit: Payer: Self-pay

## 2020-11-07 DIAGNOSIS — Z1231 Encounter for screening mammogram for malignant neoplasm of breast: Secondary | ICD-10-CM | POA: Diagnosis not present

## 2020-11-16 ENCOUNTER — Telehealth: Payer: Self-pay

## 2020-11-16 ENCOUNTER — Other Ambulatory Visit: Payer: Self-pay

## 2020-11-16 MED ORDER — FLUOCINONIDE 0.05 % EX CREA: TOPICAL_CREAM | CUTANEOUS | 0 refills | Status: DC

## 2020-11-16 NOTE — Telephone Encounter (Signed)
Rx sent in

## 2020-11-16 NOTE — Telephone Encounter (Signed)
  LAST APPOINTMENT DATE: 09/26/2020   NEXT APPOINTMENT DATE:'@10'$ /14/2022  MEDICATION: FLUOCINONIDE 0.05%  Sabrina Ramirez (SE), [place] [address]  Comments: Patient states her psoriasis is flaring up and is almost out.   Please advise

## 2021-01-08 ENCOUNTER — Other Ambulatory Visit: Payer: Self-pay

## 2021-01-08 ENCOUNTER — Emergency Department (HOSPITAL_COMMUNITY)
Admission: EM | Admit: 2021-01-08 | Discharge: 2021-01-08 | Disposition: A | Discharge status: Home / Self Care | Payer: Medicare PPO | Attending: Emergency Medicine | Admitting: Emergency Medicine

## 2021-01-08 ENCOUNTER — Encounter (HOSPITAL_COMMUNITY): Payer: Self-pay | Admitting: Emergency Medicine

## 2021-01-08 ENCOUNTER — Emergency Department (HOSPITAL_COMMUNITY): Payer: Medicare PPO

## 2021-01-08 DIAGNOSIS — K439 Ventral hernia without obstruction or gangrene: Secondary | ICD-10-CM | POA: Diagnosis not present

## 2021-01-08 DIAGNOSIS — N132 Hydronephrosis with renal and ureteral calculous obstruction: Secondary | ICD-10-CM | POA: Diagnosis not present

## 2021-01-08 DIAGNOSIS — J45909 Unspecified asthma, uncomplicated: Secondary | ICD-10-CM | POA: Diagnosis not present

## 2021-01-08 DIAGNOSIS — I129 Hypertensive chronic kidney disease with stage 1 through stage 4 chronic kidney disease, or unspecified chronic kidney disease: Secondary | ICD-10-CM | POA: Insufficient documentation

## 2021-01-08 DIAGNOSIS — K5730 Diverticulosis of large intestine without perforation or abscess without bleeding: Secondary | ICD-10-CM | POA: Diagnosis not present

## 2021-01-08 DIAGNOSIS — N183 Chronic kidney disease, stage 3 unspecified: Secondary | ICD-10-CM | POA: Insufficient documentation

## 2021-01-08 DIAGNOSIS — R109 Unspecified abdominal pain: Secondary | ICD-10-CM | POA: Diagnosis present

## 2021-01-08 DIAGNOSIS — K429 Umbilical hernia without obstruction or gangrene: Secondary | ICD-10-CM | POA: Diagnosis not present

## 2021-01-08 DIAGNOSIS — N201 Calculus of ureter: Secondary | ICD-10-CM | POA: Diagnosis not present

## 2021-01-08 LAB — COMPREHENSIVE METABOLIC PANEL
ALT: 21 U/L (ref 0–44)
AST: 23 U/L (ref 15–41)
Albumin: 3.8 g/dL (ref 3.5–5.0)
Alkaline Phosphatase: 62 U/L (ref 38–126)
Anion gap: 10 (ref 5–15)
BUN: 50 mg/dL — ABNORMAL HIGH (ref 8–23)
CO2: 22 mmol/L (ref 22–32)
Calcium: 9.3 mg/dL (ref 8.9–10.3)
Chloride: 106 mmol/L (ref 98–111)
Creatinine, Ser: 2.35 mg/dL — ABNORMAL HIGH (ref 0.44–1.00)
GFR, Estimated: 21 mL/min — ABNORMAL LOW (ref >60)
Glucose, Bld: 186 mg/dL — ABNORMAL HIGH (ref 70–99)
Potassium: 3.8 mmol/L (ref 3.5–5.1)
Sodium: 138 mmol/L (ref 135–145)
Total Bilirubin: 0.9 mg/dL (ref 0.3–1.2)
Total Protein: 7.2 g/dL (ref 6.5–8.1)

## 2021-01-08 LAB — CBC WITH DIFFERENTIAL/PLATELET
Abs Immature Granulocytes: 0.07 10*3/uL (ref 0.00–0.07)
Basophils Absolute: 0.1 10*3/uL (ref 0.0–0.1)
Basophils Relative: 1 %
Eosinophils Absolute: 0.1 10*3/uL (ref 0.0–0.5)
Eosinophils Relative: 1 %
HCT: 38.7 % (ref 36.0–46.0)
Hemoglobin: 12.6 g/dL (ref 12.0–15.0)
Immature Granulocytes: 1 %
Lymphocytes Relative: 14 %
Lymphs Abs: 1.4 10*3/uL (ref 0.7–4.0)
MCH: 31.2 pg (ref 26.0–34.0)
MCHC: 32.6 g/dL (ref 30.0–36.0)
MCV: 95.8 fL (ref 80.0–100.0)
Monocytes Absolute: 0.5 10*3/uL (ref 0.1–1.0)
Monocytes Relative: 4 %
Neutro Abs: 8.5 10*3/uL — ABNORMAL HIGH (ref 1.7–7.7)
Neutrophils Relative %: 79 %
Platelets: 193 10*3/uL (ref 150–400)
RBC: 4.04 MIL/uL (ref 3.87–5.11)
RDW: 12.6 % (ref 11.5–15.5)
WBC: 10.6 10*3/uL — ABNORMAL HIGH (ref 4.0–10.5)
nRBC: 0 % (ref 0.0–0.2)

## 2021-01-08 LAB — URINALYSIS, ROUTINE W REFLEX MICROSCOPIC
Bilirubin Urine: NEGATIVE
Glucose, UA: NEGATIVE mg/dL
Ketones, ur: NEGATIVE mg/dL
Nitrite: NEGATIVE
Protein, ur: 30 mg/dL — AB
RBC / HPF: >50 RBC/hpf — ABNORMAL HIGH (ref 0–5)
Specific Gravity, Urine: 1.017 (ref 1.005–1.030)
pH: 5 (ref 5.0–8.0)

## 2021-01-08 LAB — LIPASE, BLOOD: Lipase: 35 U/L (ref 11–51)

## 2021-01-08 MED ORDER — TAMSULOSIN HCL 0.4 MG PO CAPS: 0.4000 mg | ORAL_CAPSULE | Freq: Every day | ORAL | 0 refills | Status: AC

## 2021-01-08 MED ORDER — CEPHALEXIN 500 MG PO CAPS: 500.0000 mg | ORAL_CAPSULE | Freq: Two times a day (BID) | ORAL | 0 refills | Status: AC

## 2021-01-08 MED ORDER — OXYCODONE-ACETAMINOPHEN 5-325 MG PO TABS: 1.0000 | ORAL_TABLET | Freq: Four times a day (QID) | ORAL | 0 refills | Status: DC | PRN

## 2021-01-08 MED ORDER — CEPHALEXIN 250 MG PO CAPS
500.0000 mg | ORAL_CAPSULE | Freq: Once | ORAL | Status: AC
  Administered 2021-01-08: 500 mg via ORAL
  Filled 2021-01-08: qty 2

## 2021-01-08 MED ORDER — OXYCODONE-ACETAMINOPHEN 5-325 MG PO TABS
1.0000 | ORAL_TABLET | Freq: Once | ORAL | Status: AC
  Administered 2021-01-08: 1 via ORAL
  Filled 2021-01-08: qty 1

## 2021-01-08 MED ORDER — SENNOSIDES-DOCUSATE SODIUM 8.6-50 MG PO TABS: 1.0000 | ORAL_TABLET | Freq: Every evening | ORAL | 0 refills | Status: DC | PRN

## 2021-01-08 NOTE — ED Provider Notes (Signed)
Emergency Department Provider Note   I have reviewed the triage vital signs and the nursing notes.   HISTORY  Chief Complaint Flank Pain   HPI Sabrina Ramirez is a 80 y.o. female presents to the ED with right flank pain. Has a history of kidney stones in the past requiring lithotripsy but not followed locally. Pain was severe and throbbing in the right flank without radiation. No fever, chills, or UTI symptoms. No anterior abdominal pain. No vomiting or diarrhea. Patient given a Percocet in the waiting room and feeling some improvement in pain.    Past Medical History:  Diagnosis Date   Allergic asthma, mild intermittent, uncomplicated 0000000   Dr. Donneta Romberg, allergist   Arthritis    Chronic kidney disease    Stage III kidney disease   GERD (gastroesophageal reflux disease)    Hemorrhoids    History of kidney stones    Hypertension    IBS (irritable bowel syndrome)    IFG (impaired fasting glucose) 03/09/2019   a1c 6.3 02/2019   Psoriasis     Patient Active Problem List   Diagnosis Date Noted   IFG (impaired fasting glucose) 03/09/2019   Localized primary osteoarthritis of carpometacarpal joint of left thumb 03/08/2019   On statin therapy due to risk of future cardiovascular event 12/01/2017   Spondylolisthesis at L5-S1 level 08/13/2017   Colon polyp 05/25/2017   Ophthalmic migraine 05/25/2017   Essential tremor 05/25/2017   Allergic asthma, mild persistent, uncomplicated 0000000   Obesity, Class I, BMI 30-34.9 04/16/2015   Conjunctivitis, allergic, chronic 10/01/2012   Hypertriglyceridemia 09/22/2011   Rosacea 06/25/2011   Irritable bowel syndrome with diarrhea 07/08/2010   Benign essential hypertension 05/23/2010   CKD (chronic kidney disease) stage 3, GFR 30-59 ml/min (HCC) 05/23/2010   Chronic allergic rhinitis 02/25/2010   GERD (gastroesophageal reflux disease) 02/25/2010   Psoriasis 02/25/2010   Osteoarthritis, multiple sites 06/12/2009    Past  Surgical History:  Procedure Laterality Date   ABDOMINAL HYSTERECTOMY  1987   CATARACT EXTRACTION W/ INTRAOCULAR LENS  IMPLANT, BILATERAL Bilateral 2015   CHOLECYSTECTOMY     HEMORRHOID SURGERY     SPINE SURGERY  07/10/2017   TONSILLECTOMY      Allergies Amlodipine, Flexeril [cyclobenzaprine], Ace inhibitors, Adhesive [tape], Angiotensin receptor blockers, Corn-containing products, Iodine, and Triamterene-hctz  Family History  Problem Relation Age of Onset   Stroke Mother    Diabetes Father    Heart attack Father    Heart disease Father    Hyperlipidemia Father    Hypertension Father    Stroke Father    Diabetes Sister    Hypertension Sister    Transient ischemic attack Sister    COPD Maternal Grandmother    Heart disease Maternal Grandmother    Deep vein thrombosis Maternal Grandfather    Stroke Maternal Grandfather    Deep vein thrombosis Paternal Grandmother    Hypertension Paternal Grandmother    Hypertension Daughter    Arthritis Maternal Aunt    COPD Maternal Aunt    Hypertension Maternal Aunt    Diabetes Paternal Uncle    Heart disease Paternal Uncle    Stroke Paternal Uncle    Breast cancer Neg Hx     Social History Social History   Tobacco Use   Smoking status: Never   Smokeless tobacco: Never  Vaping Use   Vaping Use: Never used  Substance Use Topics   Alcohol use: Yes    Comment: occassionally   Drug use: No  Review of Systems  Constitutional: No fever/chills Eyes: No visual changes. ENT: No sore throat. Cardiovascular: Denies chest pain. Respiratory: Denies shortness of breath. Gastrointestinal: Positive right abdominal/flank pain.  No nausea, no vomiting.  No diarrhea.  No constipation. Genitourinary: Negative for dysuria. Musculoskeletal: Negative for back pain. Skin: Negative for rash. Neurological: Negative for headaches, focal weakness or numbness.  10-point ROS otherwise  negative.  ____________________________________________   PHYSICAL EXAM:  VITAL SIGNS: ED Triage Vitals  Enc Vitals Group     BP 01/08/21 1737 (!) 182/59     Pulse Rate 01/08/21 1737 (!) 49     Resp 01/08/21 1737 18     Temp 01/08/21 1737 97.8 F (36.6 C)     Temp Source 01/08/21 1737 Oral     SpO2 01/08/21 1737 100 %    Constitutional: Alert and oriented. Well appearing and in no acute distress. Eyes: Conjunctivae are normal.  Head: Atraumatic. Nose: No congestion/rhinnorhea. Mouth/Throat: Mucous membranes are moist.   Neck: No stridor.  Cardiovascular: Normal rate, regular rhythm. Good peripheral circulation. Grossly normal heart sounds.   Respiratory: Normal respiratory effort.  No retractions. Lungs CTAB. Gastrointestinal: Soft and nontender. No distention.  Musculoskeletal: No lower extremity tenderness nor edema. No gross deformities of extremities. Neurologic:  Normal speech and language. No gross focal neurologic deficits are appreciated.  Skin:  Skin is warm, dry and intact. No rash noted.   ____________________________________________   LABS (all labs ordered are listed, but only abnormal results are displayed)  Labs Reviewed  CBC WITH DIFFERENTIAL/PLATELET - Abnormal; Notable for the following components:      Result Value   WBC 10.6 (*)    Neutro Abs 8.5 (*)    All other components within normal limits  COMPREHENSIVE METABOLIC PANEL - Abnormal; Notable for the following components:   Glucose, Bld 186 (*)    BUN 50 (*)    Creatinine, Ser 2.35 (*)    GFR, Estimated 21 (*)    All other components within normal limits  URINALYSIS, ROUTINE W REFLEX MICROSCOPIC - Abnormal; Notable for the following components:   APPearance CLOUDY (*)    Hgb urine dipstick LARGE (*)    Protein, ur 30 (*)    Leukocytes,Ua SMALL (*)    RBC / HPF >50 (*)    Bacteria, UA FEW (*)    All other components within normal limits  LIPASE, BLOOD    ____________________________________________  RADIOLOGY  CT Renal Stone Study  Result Date: 01/08/2021 CLINICAL DATA:  Right flank pain EXAM: CT ABDOMEN AND PELVIS WITHOUT CONTRAST TECHNIQUE: Multidetector CT imaging of the abdomen and pelvis was performed following the standard protocol without IV contrast. COMPARISON:  None. FINDINGS: Lower chest: No acute pleural or parenchymal lung disease. Unenhanced CT was performed per clinician order. Lack of IV contrast limits sensitivity and specificity, especially for evaluation of abdominal/pelvic solid viscera. Hepatobiliary: No focal liver abnormality is seen. Status post cholecystectomy. No biliary dilatation. Pancreas: Unremarkable. No pancreatic ductal dilatation or surrounding inflammatory changes. Spleen: Normal in size without focal abnormality. Adrenals/Urinary Tract: There is significant right-sided obstructive uropathy due to a 7 mm obstructing mid right ureteral calculus, reference image 58/3. There is also a nonobstructing 11 mm calculus within the upper pole right kidney. No left-sided calculi. There is bilateral renal cortical atrophy. The adrenals and bladder are unremarkable. Stomach/Bowel: No bowel obstruction or ileus. Diverticulosis of the sigmoid colon without diverticulitis. No bowel wall thickening or inflammatory change. Vascular/Lymphatic: Aortic atherosclerosis. No enlarged abdominal or  pelvic lymph nodes. Reproductive: Status post hysterectomy. No adnexal masses. Other: No free fluid or free gas. Midline fat containing supraumbilical ventral hernia. No bowel herniation. Musculoskeletal: No acute or destructive bony lesions. Postsurgical changes are seen from L4 through S1. There is diffuse spondylosis and facet hypertrophy at the nonsurgical levels. Reconstructed images demonstrate no additional findings. IMPRESSION: 1. Right-sided obstructive uropathy due to a 7 mm obstructing mid right ureteral calculus. 2. 11 mm nonobstructing right  renal calculus as well. 3. Sigmoid diverticulosis without diverticulitis. 4. Midline fat containing supraumbilical ventral hernia. 5.  Aortic Atherosclerosis (ICD10-I70.0). Electronically Signed   By: Randa Ngo M.D.   On: 01/08/2021 18:40    ____________________________________________   PROCEDURES  Procedure(s) performed:   Procedures  None ____________________________________________   INITIAL IMPRESSION / ASSESSMENT AND PLAN / ED COURSE  Pertinent labs & imaging results that were available during my care of the patient were reviewed by me and considered in my medical decision making (see chart for details).   Patient presents to the ED with right flank pain. Labs and CT ordered during MSE exam. CT showing an obstructing right sided ureteral stone at 7 mm. Patient has history of CKD reflected in labs. UA without clear infection but some bacteria. Patient's pain well controlled here with PO medications. Will need to avoid NSAIDs with CKD. Plan to cover with Keflex and refer to Urology locally. Percocet and Flomax Rx sent to Pharmacy and referral placed in Epic to facilitate f/u. Discussed strict ED return precautions.    ____________________________________________  FINAL CLINICAL IMPRESSION(S) / ED DIAGNOSES  Final diagnoses:  Right ureteral stone     MEDICATIONS GIVEN DURING THIS VISIT:  Medications  oxyCODONE-acetaminophen (PERCOCET/ROXICET) 5-325 MG per tablet 1 tablet (1 tablet Oral Given 01/08/21 1745)  oxyCODONE-acetaminophen (PERCOCET/ROXICET) 5-325 MG per tablet 1 tablet (1 tablet Oral Given 01/08/21 2013)  cephALEXin (KEFLEX) capsule 500 mg (500 mg Oral Given 01/08/21 2232)  oxyCODONE-acetaminophen (PERCOCET/ROXICET) 5-325 MG per tablet 1 tablet (1 tablet Oral Given 01/08/21 2232)     NEW OUTPATIENT MEDICATIONS STARTED DURING THIS VISIT:  Discharge Medication List as of 01/08/2021 10:43 PM     START taking these medications   Details  cephALEXin (KEFLEX) 500 MG  capsule Take 1 capsule (500 mg total) by mouth 2 (two) times daily for 7 days., Starting Tue 01/08/2021, Until Tue 01/15/2021, Normal    oxyCODONE-acetaminophen (PERCOCET/ROXICET) 5-325 MG tablet Take 1 tablet by mouth every 6 (six) hours as needed for severe pain., Starting Tue 01/08/2021, Normal    senna-docusate (SENOKOT-S) 8.6-50 MG tablet Take 1 tablet by mouth at bedtime as needed for mild constipation or moderate constipation., Starting Tue 01/08/2021, Normal    tamsulosin (FLOMAX) 0.4 MG CAPS capsule Take 1 capsule (0.4 mg total) by mouth daily for 14 days., Starting Tue 01/08/2021, Until Tue 01/22/2021, Normal        Note:  This document was prepared using Dragon voice recognition software and may include unintentional dictation errors.  Nanda Quinton, MD, [facility]    Caterine Mcmeans, [facility], MD 01/09/21 1115

## 2021-01-08 NOTE — Discharge Instructions (Addendum)
You have been seen in the Emergency Department (ED) today for pain that we believe based on your workup, is caused by kidney stones.  As we have discussed, please drink plenty of fluids.  Please make a follow up appointment with the physician(s) listed elsewhere in this documentation.  You may take pain medication as needed but ONLY as prescribed.  Please also take your prescribed Flomax daily.  We also recommend that you take over-the-counter ibuprofen regularly according to label instructions over the next 5 days.  Take it with meals to minimize stomach discomfort.  Please see your doctor as soon as possible as stones may take 1-3 weeks to pass and you may require additional care or medications.  Do not drink alcohol, drive or participate in any other potentially dangerous activities while taking opiate pain medication as it may make you sleepy. Do not take this medication with any other sedating medications, either prescription or over-the-counter. If you were prescribed Percocet or Vicodin, do not take these with acetaminophen (Tylenol) as it is already contained within these medications.   Take Percocet as needed for severe pain.  This medication is an opiate (or narcotic) pain medication and can be habit forming.  Use it as little as possible to achieve adequate pain control.  Do not use or use it with extreme caution if you have a history of opiate abuse or dependence.  If you are on a pain contract with your primary care doctor or a pain specialist, be sure to let them know you were prescribed this medication today from the Emergency Department.  This medication is intended for your use only - do not give any to anyone else and keep it in a secure place where nobody else, especially children, have access to it.  It will also cause or worsen constipation, so you may want to consider taking an over-the-counter stool softener while you are taking this medication.  Return to the Emergency Department  (ED) or call your doctor if you have any worsening pain, fever, painful urination, are unable to urinate, or develop other symptoms that concern you.    

## 2021-01-08 NOTE — ED Triage Notes (Signed)
Pt c/o right sided flank pain and nausea that started at 1pm today. Hx kidney stone.

## 2021-01-08 NOTE — ED Provider Notes (Signed)
Emergency Medicine Provider Triage Evaluation Note  Sabrina Ramirez , a 80 y.o. female  was evaluated in triage.  Pt complains of R flank pain and nv/ feels like prior kidney stones.  Review of Systems  Positive: Flank pain, nv Negative: fever  Physical Exam  There were no vitals taken for this visit. Gen:   Awake, no distress   Resp:  Normal effort  MSK:   Moves extremities without difficulty  Other:  R cva ttp  Medical Decision Making  Medically screening exam initiated at 5:35 PM.  Appropriate orders placed.  Sabrina Ramirez was informed that the remainder of the evaluation will be completed by another provider, this initial triage assessment does not replace that evaluation, and the importance of remaining in the ED until their evaluation is complete.     Bishop Dublin 01/08/21 1737    Wyvonnia Dusky, MD 01/08/21 [phone]

## 2021-01-09 ENCOUNTER — Telehealth: Payer: Self-pay

## 2021-01-09 DIAGNOSIS — N202 Calculus of kidney with calculus of ureter: Secondary | ICD-10-CM | POA: Diagnosis not present

## 2021-01-09 NOTE — Telephone Encounter (Signed)
FYI

## 2021-01-09 NOTE — Telephone Encounter (Signed)
Patient was seen in ED.   Initial Comment Caller states the patient is in distress and asking for help. She is on a special diet for IBS. She has a pain on her right side by her hip. It feels like it is in her large intestine. She has kidney disease. No fever. Should she take something or be seen? Pain is severe in her abdomen area. Pain is not moving. Been going on for about 4 hours. She had diarrhea and nauseaus . Translation No Nurse Assessment Nurse: Hassell Done, RN, Melanie Date/Time (Eastern Time): 01/08/2021 4:25:10 PM Confirm and document reason for call. If symptomatic, describe symptoms. ---Caller states she is having abdominal pain and rates it a 9 plus and it is constant. Does the patient have any new or worsening symptoms? ---Yes Will a triage be completed? ---Yes Related visit to physician within the last 2 weeks? ---No Does the PT have any chronic conditions? (i.e. diabetes, asthma, this includes High risk factors for pregnancy, etc.) ---Yes List chronic conditions. ---kidney disease Is this a behavioral health or substance abuse call? ---No Guidelines Guideline Title Affirmed Question Affirmed Notes Nurse Date/Time Eilene Ghazi Time) Abdominal Pain - Female [1] SEVERE pain (e.g., excruciating) Hassell Done, RN, Melanie 01/08/2021 4:26:39 PM PLEASE NOTE: All timestamps contained within this report are represented as [place] Standard Time. CONFIDENTIALTY NOTICE: This fax transmission is intended only for the addressee. It contains information that is legally privileged, confidential or otherwise protected from use or disclosure. If you are not the intended recipient, you are strictly prohibited from reviewing, disclosing, copying using or disseminating any of this information or taking any action in reliance on or regarding this information. If you have received this fax in error, please notify us immediately by telephone so that we can arrange for its return to [place]. Phone:  [phone], Toll-Free: [phone], Fax: [phone] Page: 2 of 2 Call Id: [id number] Guidelines Guideline Title Affirmed Question Affirmed Notes Nurse Date/Time Eilene Ghazi Time) AND [2] present > 1 hour Disp. Time Eilene Ghazi Time) Disposition Final User 01/08/2021 4:22:03 PM Send to Urgent Coy Saunas 01/08/2021 4:30:05 PM Go to ED Now Yes Hassell Done, RN, Donnajean Lopes Disagree/Comply Comply Caller Understands Yes PreDisposition Call a family member Care Advice Given Per Guideline GO TO ED NOW: * You need to be seen in the Emergency Department. * Go to the ED at ___________ [place]: * Do not eat or drink anything for now. * Reason: condition may need surgery and general anesthesia. CARE ADVICE given per Abdominal Pain, Female (Adult) guideline. ANOTHER ADULT SHOULD DRIVE: * It is better and safer if another adult drives instead of you. Referrals [facility] - ED

## 2021-01-23 DIAGNOSIS — N201 Calculus of ureter: Secondary | ICD-10-CM | POA: Diagnosis not present

## 2021-01-23 DIAGNOSIS — N202 Calculus of kidney with calculus of ureter: Secondary | ICD-10-CM | POA: Diagnosis not present

## 2021-01-30 ENCOUNTER — Encounter: Payer: Self-pay | Admitting: Family Medicine

## 2021-01-31 ENCOUNTER — Encounter: Payer: Self-pay | Admitting: Family Medicine

## 2021-03-22 ENCOUNTER — Ambulatory Visit (INDEPENDENT_AMBULATORY_CARE_PROVIDER_SITE_OTHER): Payer: Medicare PPO | Admitting: Family Medicine

## 2021-03-22 ENCOUNTER — Other Ambulatory Visit: Payer: Self-pay

## 2021-03-22 ENCOUNTER — Encounter: Payer: Self-pay | Admitting: Family Medicine

## 2021-03-22 VITALS — BP 124/68 | HR 55 | Temp 97.8°F | Ht 64.0 in | Wt 175.2 lb

## 2021-03-22 DIAGNOSIS — Z1283 Encounter for screening for malignant neoplasm of skin: Secondary | ICD-10-CM

## 2021-03-22 DIAGNOSIS — B351 Tinea unguium: Secondary | ICD-10-CM

## 2021-03-22 DIAGNOSIS — N1831 Chronic kidney disease, stage 3a: Secondary | ICD-10-CM | POA: Diagnosis not present

## 2021-03-22 DIAGNOSIS — N200 Calculus of kidney: Secondary | ICD-10-CM | POA: Diagnosis not present

## 2021-03-22 DIAGNOSIS — K219 Gastro-esophageal reflux disease without esophagitis: Secondary | ICD-10-CM | POA: Diagnosis not present

## 2021-03-22 DIAGNOSIS — I10 Essential (primary) hypertension: Secondary | ICD-10-CM | POA: Diagnosis not present

## 2021-03-22 DIAGNOSIS — R7301 Impaired fasting glucose: Secondary | ICD-10-CM | POA: Diagnosis not present

## 2021-03-22 DIAGNOSIS — Z23 Encounter for immunization: Secondary | ICD-10-CM | POA: Diagnosis not present

## 2021-03-22 DIAGNOSIS — Z0000 Encounter for general adult medical examination without abnormal findings: Secondary | ICD-10-CM | POA: Diagnosis not present

## 2021-03-22 DIAGNOSIS — K580 Irritable bowel syndrome with diarrhea: Secondary | ICD-10-CM

## 2021-03-22 HISTORY — DX: Calculus of kidney: N20.0

## 2021-03-22 LAB — COMPREHENSIVE METABOLIC PANEL
ALT: 16 U/L (ref 0–35)
AST: 20 U/L (ref 0–37)
Albumin: 4.1 g/dL (ref 3.5–5.2)
Alkaline Phosphatase: 56 U/L (ref 39–117)
BUN: 53 mg/dL — ABNORMAL HIGH (ref 6–23)
CO2: 25 mEq/L (ref 19–32)
Calcium: 9.8 mg/dL (ref 8.4–10.5)
Chloride: 105 mEq/L (ref 96–112)
Creatinine, Ser: 2.45 mg/dL — ABNORMAL HIGH (ref 0.40–1.20)
GFR: 18.24 mL/min — ABNORMAL LOW (ref >60.00)
Glucose, Bld: 112 mg/dL — ABNORMAL HIGH (ref 70–99)
Potassium: 4.1 mEq/L (ref 3.5–5.1)
Sodium: 138 mEq/L (ref 135–145)
Total Bilirubin: 0.8 mg/dL (ref 0.2–1.2)
Total Protein: 7.3 g/dL (ref 6.0–8.3)

## 2021-03-22 LAB — CBC WITH DIFFERENTIAL/PLATELET
Basophils Absolute: 0.1 10*3/uL (ref 0.0–0.1)
Basophils Relative: 1 % (ref 0.0–3.0)
Eosinophils Absolute: 0.3 10*3/uL (ref 0.0–0.7)
Eosinophils Relative: 4.6 % (ref 0.0–5.0)
HCT: 35.1 % — ABNORMAL LOW (ref 36.0–46.0)
Hemoglobin: 11.8 g/dL — ABNORMAL LOW (ref 12.0–15.0)
Lymphocytes Relative: 24.6 % (ref 12.0–46.0)
Lymphs Abs: 1.4 10*3/uL (ref 0.7–4.0)
MCHC: 33.6 g/dL (ref 30.0–36.0)
MCV: 93.9 fl (ref 78.0–100.0)
Monocytes Absolute: 0.5 10*3/uL (ref 0.1–1.0)
Monocytes Relative: 9.6 % (ref 3.0–12.0)
Neutro Abs: 3.4 10*3/uL (ref 1.4–7.7)
Neutrophils Relative %: 60.2 % (ref 43.0–77.0)
Platelets: 170 10*3/uL (ref 150.0–400.0)
RBC: 3.74 Mil/uL — ABNORMAL LOW (ref 3.87–5.11)
RDW: 13.4 % (ref 11.5–15.5)
WBC: 5.7 10*3/uL (ref 4.0–10.5)

## 2021-03-22 LAB — TSH: TSH: 2.39 u[IU]/mL (ref 0.35–5.50)

## 2021-03-22 LAB — HEMOGLOBIN A1C: Hgb A1c MFr Bld: 6 % (ref 4.6–6.5)

## 2021-03-22 LAB — LIPID PANEL
Cholesterol: 116 mg/dL (ref 0–200)
HDL: 42.7 mg/dL (ref >39.00)
LDL Cholesterol: 46 mg/dL (ref 0–99)
NonHDL: 73.64
Total CHOL/HDL Ratio: 3
Triglycerides: 138 mg/dL (ref 0.0–149.0)
VLDL: 27.6 mg/dL (ref 0.0–40.0)

## 2021-03-22 MED ORDER — METOPROLOL SUCCINATE ER 100 MG PO TB24: 100.0000 mg | ORAL_TABLET | Freq: Every day | ORAL | 3 refills | Status: DC

## 2021-03-22 MED ORDER — CICLOPIROX 8 % EX SOLN: Freq: Every day | CUTANEOUS | 1 refills | Status: AC

## 2021-03-22 MED ORDER — HYDROCHLOROTHIAZIDE 25 MG PO TABS: 12.5000 mg | ORAL_TABLET | Freq: Every day | ORAL | 3 refills | Status: DC

## 2021-03-22 NOTE — Progress Notes (Signed)
Subjective  Chief Complaint  Patient presents with   Annual Exam   Hypertension   Gastroesophageal Reflux   Chronic Kidney Disease    HPI: Sabrina Ramirez is a 80 y.o. female who presents to [facility] today for a Female Wellness Visit. She also has the concerns and/or needs as listed above in the chief complaint. These will be addressed in addition to the Health Maintenance Visit.   Wellness Visit: annual visit with health maintenance review and exam without Pap  HM: screens are up to date and nl. Imms: eligible for flu today. Would like skin cancer screening: needs derm.  Chronic disease f/u and/or acute problem visit: (deemed necessary to be done in addition to the wellness visit): Htn: Feeling well. Taking medications w/o adverse effects. No symptoms of CHF, angina; no palpitations, sob, cp or lower extremity edema. Compliant with meds.  H/o renal stones treated in ED in august. Recovered.  CKD: no sxs. Not on ace or arb due to allergy.  IBS is much improved now on fodmap diet GERD is controlled on omeprazole HLD on very low dose crestor. Due for recheck. Tolerates well IFG: no sxs of hyperglycemia.  C/o fungal toenails.   Assessment  1. Annual physical exam   2. Benign essential hypertension   3. Stage 3a chronic kidney disease ([place])   4. IFG (impaired fasting glucose)   5. Gastroesophageal reflux disease, unspecified whether esophagitis present   6. Onychomycosis   7. Nephrolithiasis   8. Skin cancer screening   9. Irritable bowel syndrome with diarrhea   10. Need for immunization against influenza      Plan  Female Wellness Visit: Age appropriate Health Maintenance and Prevention measures were discussed with patient. Included topics are cancer screening recommendations, ways to keep healthy (see AVS) including dietary and exercise recommendations, regular eye and dental care, use of seat belts, and avoidance of moderate alcohol use and  tobacco use.  BMI: discussed patient's BMI and encouraged positive lifestyle modifications to help get to or maintain a target BMI. HM needs and immunizations were addressed and ordered. See below for orders. See HM and immunization section for updates. Flu shot today. Routine labs and screening tests ordered including cmp, cbc and lipids where appropriate. Discussed recommendations regarding Vit D and calcium supplementation (see AVS)  Chronic disease management visit and/or acute problem visit: Chornic problems are controlled. No med changes today. Bb and hctz for HTN> continue crestor 2.5 for HLD. On chronic omeprazole for GERD. Monitoring CKD. Allergy and asthma per specialist.  [place] for fungal toe nails.  Recheck a1c.  Follow up: 59mofor htn f/u  Orders Placed This Encounter  Procedures   Flu Vaccine QUAD High Dose(Fluad)   CBC with Differential/Platelet   Comprehensive metabolic panel   Hemoglobin A1c   Lipid panel   TSH   Ambulatory referral to Dermatology   Meds ordered this encounter  Medications   hydrochlorothiazide (HYDRODIURIL) 25 MG tablet    Sig: Take 0.5 tablets (12.5 mg total) by mouth daily.    Dispense:  90 tablet    Refill:  3   metoprolol succinate (TOPROL-XL) 100 MG 24 hr tablet    Sig: Take 1 tablet (100 mg total) by mouth daily. Take with or immediately following a meal.    Dispense:  90 tablet    Refill:  3   ciclopirox (PENLAC) 8 % solution    Sig: Apply topically at bedtime. Apply over  nail and surrounding skin. Apply daily over previous coat. After seven (7) days, may remove with alcohol and continue cycle.    Dispense:  6.6 mL    Refill:  1      Body mass index is 30.07 kg/m. Wt Readings from Last 3 Encounters:  03/22/21 175 lb 3.2 oz (79.5 kg)  09/19/20 188 lb 6.4 oz (85.5 kg)  03/27/20 192 lb 6.4 oz (87.3 kg)     Patient Active Problem List   Diagnosis Date Noted   IFG (impaired fasting glucose) 03/09/2019    Priority: 1.    a1c 6.3  02/2019    Obesity, Class I, BMI 30-34.9 04/16/2015    Priority: 1.   Hypertriglyceridemia 09/22/2011    Priority: 1.   Benign essential hypertension 05/23/2010    Priority: 1.   CKD (chronic kidney disease) stage 3, GFR 30-59 ml/min (HCC) 05/23/2010    Priority: 1.    Overview:  Nl SPEP,UPEP and mild cortical thinning on renal ultrasound 2014    Localized primary osteoarthritis of carpometacarpal joint of left thumb 03/08/2019    Priority: 2.   On statin therapy due to risk of future cardiovascular event 12/01/2017    Priority: 2.   Spondylolisthesis at L5-S1 level 08/13/2017    Priority: 2.   Colon polyp 05/25/2017    Priority: 2.    Overview:  1968, none on subsequent colonoscopy, [facility], last 2012    Ophthalmic migraine 05/25/2017    Priority: 2.   Essential tremor 05/25/2017    Priority: 2.   Allergic asthma, mild persistent, uncomplicated 0000000    Priority: 2.    Dr. Donneta Romberg, allergist    Irritable bowel syndrome with diarrhea 07/08/2010    Priority: 2.   Chronic allergic rhinitis 02/25/2010    Priority: 2.   GERD (gastroesophageal reflux disease) 02/25/2010    Priority: 2.    Overview:  On chronic PPI since early 2000s. Neg H.pylor    Osteoarthritis, multiple sites 06/12/2009    Priority: 2.   Conjunctivitis, allergic, chronic 10/01/2012    Priority: 3.   Rosacea 06/25/2011    Priority: 3.   Psoriasis 02/25/2010    Priority: 3.   Nephrolithiasis 03/22/2021    Right ureteral stone; [facility] 2022    Health Maintenance  Topic Date Due   TETANUS/TDAP  05/23/2020   COVID-19 Vaccine (6 - Booster for Pfizer series) 05/15/2021   MAMMOGRAM  11/07/2021   DEXA SCAN  04/28/2023   Pneumonia Vaccine 86+ Years old  Completed   INFLUENZA VACCINE  Completed   Zoster Vaccines- Shingrix  Completed   HPV VACCINES  Aged Out   Hepatitis C Screening  Discontinued   Immunization History  Administered Date(s) Administered   Fluad Quad(high Dose 65+)  03/08/2019, 03/27/2020, 03/22/2021   Influenza, High Dose Seasonal PF 03/02/2012, 04/06/2013, 04/21/2014, 04/16/2015, 03/06/2016, 04/09/2017, 03/01/2018   Influenza-Unspecified 04/12/2011   PFIZER(Purple Top)SARS-COV-2 Vaccination 07/02/2019, 07/23/2019, 09/16/2019, 03/06/2020, 03/20/2021   Pfizer Covid-19 Vaccine Bivalent Booster 4yr & up 03/20/2021   Pneumococcal Conjugate-13 10/05/2014   Pneumococcal Polysaccharide-23 04/23/2010, 11/12/2015, 07/01/2016, 07/13/2017, 01/11/2018   Pneumococcal-Unspecified 04/23/2010   Tdap 05/23/2010   Zoster Recombinat (Shingrix) 11/18/2017, 03/23/2018   Zoster, Live 06/09/2004   We updated and reviewed the patient's past history in detail and it is documented below. Allergies: Patient is allergic to amlodipine, flexeril [cyclobenzaprine], chlor-trimeton [chlorpheniramine], ace inhibitors, adhesive [tape], angiotensin receptor blockers, corn-containing products, iodine, and triamterene-hctz. Past Medical History Patient  has a past medical history  of Allergic asthma, mild intermittent, uncomplicated (0000000), Arthritis, Chronic kidney disease, GERD (gastroesophageal reflux disease), Hemorrhoids, History of kidney stones, Hypertension, IBS (irritable bowel syndrome), IFG (impaired fasting glucose) (03/09/2019), Nephrolithiasis (03/22/2021), and Psoriasis. Past Surgical History Patient  has a past surgical history that includes Cholecystectomy; Hemorrhoid surgery; Cataract extraction w/ intraocular lens  implant, bilateral (Bilateral, 2015); Abdominal hysterectomy (1987); Tonsillectomy; and Spine surgery (07/10/2017). Family History: Patient family history includes Arthritis in her maternal aunt; COPD in her maternal aunt and maternal grandmother; Deep vein thrombosis in her maternal grandfather and paternal grandmother; Diabetes in her father, paternal uncle, and sister; Heart attack in her father; Heart disease in her father, maternal grandmother, and  paternal uncle; Hyperlipidemia in her father; Hypertension in her daughter, father, maternal aunt, paternal grandmother, and sister; Stroke in her father, maternal grandfather, mother, and paternal uncle; Transient ischemic attack in her sister. Social History:  Patient  reports that she has never smoked. She has never used smokeless tobacco. She reports current alcohol use. She reports that she does not use drugs.  Review of Systems: Constitutional: negative for fever or malaise Ophthalmic: negative for photophobia, double vision or loss of vision Cardiovascular: negative for chest pain, dyspnea on exertion, or new LE swelling Respiratory: negative for SOB or persistent cough Gastrointestinal: negative for abdominal pain, change in bowel habits or melena Genitourinary: negative for dysuria or gross hematuria, no abnormal uterine bleeding or disharge Musculoskeletal: negative for new gait disturbance or muscular weakness Integumentary: negative for new or persistent rashes, no breast lumps Neurological: negative for TIA or stroke symptoms Psychiatric: negative for SI or delusions Allergic/Immunologic: negative for hives  Patient Care Team    Relationship Specialty Notifications Start End  Leamon Arnt, MD PCP - General Family Medicine  05/25/17   Mosetta Anis, MD Referring Physician Allergy  05/25/17   Consuella Lose, MD Consulting Physician Neurosurgery  10/28/17   Mottinger, Sharyn Lull  Dentistry  10/28/17     Objective  Vitals: BP 124/68   Pulse (!) 55   Temp 97.8 F (36.6 C) (Temporal)   Ht '5\' 4"'$  (1.626 m)   Wt 175 lb 3.2 oz (79.5 kg)   SpO2 99%   BMI 30.07 kg/m  General:  Well developed, well nourished, no acute distress  Psych:  Alert and orientedx3,normal mood and affect HEENT:  Normocephalic, atraumatic, non-icteric sclera,  supple neck without adenopathy, mass or thyromegaly Cardiovascular:  Normal S1, S2, RRR without gallop, rub or murmur Respiratory:  Good  breath sounds bilaterally, CTAB with normal respiratory effort Gastrointestinal: normal bowel sounds, soft, non-tender, no noted masses. No HSM MSK: no deformities, contusions. Joints are without erythema or swelling.  Skin:  Warm, no rashes or suspicious lesions noted, fungal toenails: discolored. Neurologic:    Mental status is normal. CN 2-11 are normal. Gross motor and sensory exams are normal. Normal gait. No tremor   Commons side effects, risks, benefits, and alternatives for medications and treatment plan prescribed today were discussed, and the patient expressed understanding of the given instructions. Patient is instructed to call or message via MyChart if he/she has any questions or concerns regarding our treatment plan. No barriers to understanding were identified. We discussed Red Flag symptoms and signs in detail. Patient expressed understanding regarding what to do in case of urgent or emergency type symptoms.  Medication list was reconciled, printed and provided to the patient in AVS. Patient instructions and summary information was reviewed with the patient as documented in the AVS. This note was  prepared with assistance of Systems analyst. Occasional wrong-word or sound-a-like substitutions may have occurred due to the inherent limitations of voice recognition software  This visit occurred during the SARS-CoV-2 public health emergency.  Safety protocols were in place, including screening questions prior to the visit, additional usage of staff PPE, and extensive cleaning of exam room while observing appropriate contact time as indicated for disinfecting solutions.

## 2021-03-22 NOTE — Patient Instructions (Signed)
Please return in 6 months for hypertension follow up.   I will release your lab results to you on your MyChart account with further instructions. Please reply with any questions.    Glad you are doing well! Today you were given your Flu vaccination.    We will call you with information regarding your referral appointment. Dermatology. If you do not hear from Sabrina Ramirez within the next 2 weeks, please let me know. It can take 1-2 weeks to get appointments set up with the specialists.  Dermatology appointments are running 3-6 months out.   If you have any questions or concerns, please don't hesitate to send me a message via MyChart or call the office at [phone]. Thank you for visiting with Sabrina Ramirez today! It's our pleasure caring for you.   Fungal Nail Infection A fungal nail infection is a common infection of the toenails or fingernails. This condition affects toenails more often than fingernails. It often affects the great, or big, toes. More than one nail may be infected. The condition can be passed from person to person (is contagious). What are the causes? This condition is caused by a fungus, such as yeast or molds. Several types of fungi can cause the infection. These fungi are common in moist and warm areas. If your hands or feet come into contact with the fungus, it may get into a crack in your fingernail or toenail or in the surrounding skin, and cause an infection. What increases the risk? The following factors may make you more likely to develop this condition: Being of older age. Having certain medical conditions, such as: Athlete's foot. Diabetes. Poor circulation. A weak body defense system (immune system). Walking barefoot in areas where the fungus thrives, such as showers or locker rooms. Wearing shoes and socks that cause your feet to sweat. Having a nail injury or a recent nail surgery. What are the signs or symptoms? Symptoms of this condition include: A pale spot on the  nail. Thickening of the nail. A nail that becomes yellow, brown, or white. A brittle or ragged nail edge. A nail that has lifted away from the nail bed. How is this diagnosed? This condition is diagnosed with a physical exam. Your health care provider may take a scraping or clipping from your nail to test for the fungus. How is this treated? Treatment is not needed for mild infections. If you have significant nail changes, treatment may include: Antifungal medicines taken by mouth (orally). You may need to take the medicine for several weeks or several months, and you may not see the results for a long time. These medicines can cause side effects. Ask your health care provider what problems to watch for. Antifungal nail polish or nail cream. These may be used along with oral antifungal medicines. Laser treatment of the nail. Surgery to remove the nail. This may be needed for the most severe infections. It can take a long time, usually up to a year, for the infection to go away. The infection may also come back. Follow these instructions at home: Medicines Take or apply over-the-counter and prescription medicines only as told by your health care provider. Ask your health care provider about using over-the-counter mentholated ointment on your nails. Nail care Trim your nails often. Wash and dry your hands and feet every [place]. Keep your feet dry. To do this: Wear absorbent socks, and change your socks frequently. Wear shoes that allow air to circulate, such as sandals or canvas tennis shoes. Throw  out old shoes. If you go to a nail salon, make sure you choose one that uses clean instruments. Use antifungal foot powder on your feet and in your shoes. General instructions Do not share personal items, such as towels or nail clippers. Do not walk barefoot in shower rooms or locker rooms. Wear rubber gloves if you are working with your hands in wet areas. Keep all follow-up visits. This is  important. Contact a health care provider if: You have redness, pain, or pus near the toenail or fingernail. Your infection is not getting better, or it is getting worse after several months. You have more circulation problems near the toenail or fingernail. You have brown or black discoloration of the nail that spreads to the surrounding skin. Summary A fungal nail infection is a common infection of the toenails or fingernails. Treatment is not needed for mild infections. If you have significant nail changes, treatment may include taking medicine orally and applying medicine to your nails. It can take a long time, usually up to a year, for the infection to go away. The infection may also come back. Take or apply over-the-counter and prescription medicines only as told by your health care provider. This information is not intended to replace advice given to you by your health care provider. Make sure you discuss any questions you have with your health care provider. Document Revised: 08/27/2020 Document Reviewed: 08/27/2020 Elsevier Patient Education  [place].

## 2021-03-28 ENCOUNTER — Other Ambulatory Visit: Payer: Self-pay

## 2021-03-28 DIAGNOSIS — N1831 Chronic kidney disease, stage 3a: Secondary | ICD-10-CM

## 2021-03-29 ENCOUNTER — Ambulatory Visit (INDEPENDENT_AMBULATORY_CARE_PROVIDER_SITE_OTHER): Payer: Medicare PPO

## 2021-03-29 DIAGNOSIS — Z0000 Encounter for general adult medical examination without abnormal findings: Secondary | ICD-10-CM

## 2021-03-29 NOTE — Patient Instructions (Addendum)
Sabrina Ramirez , Thank you for taking time to come for your Medicare Wellness Visit. I appreciate your ongoing commitment to your health goals. Please review the following plan we discussed and let me know if I can assist you in the future.   Screening recommendations/referrals: Colonoscopy: No longer required  Mammogram: Done 11/07/20 repeat every year  Bone Density: Done 06/27/17 repeat every 3-5 years  Recommended yearly ophthalmology/optometry visit for glaucoma screening and checkup Recommended yearly dental visit for hygiene and checkup  Vaccinations: Influenza vaccine: Done 03/22/21 repeat every year  Pneumococcal vaccine: Up to date Tdap vaccine: Due and discussed  Shingles vaccine: Completed 6/12 & 03/23/18   Covid-19:Completed 1/23, 2/13, 4/9, 03/06/20 & 03/20/21  Advanced directives: Copies in chart   Conditions/risks identified: Want to walk without having pain   Next appointment: Follow up in one year for your annual wellness visit    Preventive Care 50 Years and Older, Female Preventive care refers to lifestyle choices and visits with your health care provider that can promote health and wellness. What does preventive care include? A yearly physical exam. This is also called an annual well check. Dental exams once or twice a year. Routine eye exams. Ask your health care provider how often you should have your eyes checked. Personal lifestyle choices, including: Daily care of your teeth and gums. Regular physical activity. Eating a healthy diet. Avoiding tobacco and drug use. Limiting alcohol use. Practicing safe sex. Taking low-dose aspirin every [place]. Taking vitamin and mineral supplements as recommended by your health care provider. What happens during an annual well check? The services and screenings done by your health care provider during your annual well check will depend on your age, overall health, lifestyle risk factors, and family history of disease. Counseling   Your health care provider may ask you questions about your: Alcohol use. Tobacco use. Drug use. Emotional well-being. Home and relationship well-being. Sexual activity. Eating habits. History of falls. Memory and ability to understand (cognition). Work and work Statistician. Reproductive health. Screening  You may have the following tests or measurements: Height, weight, and BMI. Blood pressure. Lipid and cholesterol levels. These may be checked every 5 years, or more frequently if you are over 31 years old. Skin check. Lung cancer screening. You may have this screening every year starting at age 1 if you have a 30-pack-year history of smoking and currently smoke or have quit within the past 15 years. Fecal occult blood test (FOBT) of the stool. You may have this test every year starting at age 59. Flexible sigmoidoscopy or colonoscopy. You may have a sigmoidoscopy every 5 years or a colonoscopy every 10 years starting at age 56. Hepatitis C blood test. Hepatitis B blood test. Sexually transmitted disease (STD) testing. Diabetes screening. This is done by checking your blood sugar (glucose) after you have not eaten for a while (fasting). You may have this done every 1-3 years. Bone density scan. This is done to screen for osteoporosis. You may have this done starting at age 30. Mammogram. This may be done every 1-2 years. Talk to your health care provider about how often you should have regular mammograms. Talk with your health care provider about your test results, treatment options, and if necessary, the need for more tests. Vaccines  Your health care provider may recommend certain vaccines, such as: Influenza vaccine. This is recommended every year. Tetanus, diphtheria, and acellular pertussis (Tdap, Td) vaccine. You may need a Td booster every 10 years. Zoster vaccine.  You may need this after age 47. Pneumococcal 13-valent conjugate (PCV13) vaccine. One dose is recommended  after age 54. Pneumococcal polysaccharide (PPSV23) vaccine. One dose is recommended after age 75. Talk to your health care provider about which screenings and vaccines you need and how often you need them. This information is not intended to replace advice given to you by your health care provider. Make sure you discuss any questions you have with your health care provider. Document Released: 06/22/2015 Document Revised: 02/13/2016 Document Reviewed: 03/27/2015 Elsevier Interactive Patient Education  2017 [place] Prevention in the Home Falls can cause injuries. They can happen to people of all ages. There are many things you can do to make your home safe and to help prevent falls. What can I do on the outside of my home? Regularly fix the edges of walkways and driveways and fix any cracks. Remove anything that might make you trip as you walk through a door, such as a raised step or threshold. Trim any bushes or trees on the path to your home. Use bright outdoor lighting. Clear any walking paths of anything that might make someone trip, such as rocks or tools. Regularly check to see if handrails are loose or broken. Make sure that both sides of any steps have handrails. Any raised decks and porches should have guardrails on the edges. Have any leaves, snow, or ice cleared regularly. Use sand or salt on walking paths during winter. Clean up any spills in your garage right away. This includes oil or grease spills. What can I do in the bathroom? Use night lights. Install grab bars by the toilet and in the tub and shower. Do not use towel bars as grab bars. Use non-skid mats or decals in the tub or shower. If you need to sit down in the shower, use a plastic, non-slip stool. Keep the floor dry. Clean up any water that spills on the floor as soon as it happens. Remove soap buildup in the tub or shower regularly. Attach bath mats securely with double-sided non-slip rug tape. Do not  have throw rugs and other things on the floor that can make you trip. What can I do in the bedroom? Use night lights. Make sure that you have a light by your bed that is easy to reach. Do not use any sheets or blankets that are too big for your bed. They should not hang down onto the floor. Have a firm chair that has side arms. You can use this for support while you get dressed. Do not have throw rugs and other things on the floor that can make you trip. What can I do in the kitchen? Clean up any spills right away. Avoid walking on wet floors. Keep items that you use a lot in easy-to-reach places. If you need to reach something above you, use a strong step stool that has a grab bar. Keep electrical cords out of the way. Do not use floor polish or wax that makes floors slippery. If you must use wax, use non-skid floor wax. Do not have throw rugs and other things on the floor that can make you trip. What can I do with my stairs? Do not leave any items on the stairs. Make sure that there are handrails on both sides of the stairs and use them. Fix handrails that are broken or loose. Make sure that handrails are as long as the stairways. Check any carpeting to make sure that it is  firmly attached to the stairs. Fix any carpet that is loose or worn. Avoid having throw rugs at the top or bottom of the stairs. If you do have throw rugs, attach them to the floor with carpet tape. Make sure that you have a light switch at the top of the stairs and the bottom of the stairs. If you do not have them, ask someone to add them for you. What else can I do to help prevent falls? Wear shoes that: Do not have high heels. Have rubber bottoms. Are comfortable and fit you well. Are closed at the toe. Do not wear sandals. If you use a stepladder: Make sure that it is fully opened. Do not climb a closed stepladder. Make sure that both sides of the stepladder are locked into place. Ask someone to hold it for  you, if possible. Clearly mark and make sure that you can see: Any grab bars or handrails. First and last steps. Where the edge of each step is. Use tools that help you move around (mobility aids) if they are needed. These include: Canes. Walkers. Scooters. Crutches. Turn on the lights when you go into a dark area. Replace any light bulbs as soon as they burn out. Set up your furniture so you have a clear path. Avoid moving your furniture around. If any of your floors are uneven, fix them. If there are any pets around you, be aware of where they are. Review your medicines with your doctor. Some medicines can make you feel dizzy. This can increase your chance of falling. Ask your doctor what other things that you can do to help prevent falls. This information is not intended to replace advice given to you by your health care provider. Make sure you discuss any questions you have with your health care provider. Document Released: 03/22/2009 Document Revised: 11/01/2015 Document Reviewed: 06/30/2014 Elsevier Interactive Patient Education  2017 [employer].

## 2021-03-29 NOTE — Progress Notes (Addendum)
Virtual Visit via Telephone Note  I connected with  [place] Locastro on 03/29/21 at 11:00 AM EDT by telephone and verified that I am speaking with the correct person using two identifiers.  Medicare Annual Wellness visit completed telephonically due to Covid-19 pandemic.   Persons participating in this call: This Health Coach and this patient.   Location: Patient: Home Provider: Office   I discussed the limitations, risks, security and privacy concerns of performing an evaluation and management service by telephone and the availability of in person appointments. The patient expressed understanding and agreed to proceed.  Unable to perform video visit due to video visit attempted and failed and/or patient does not have video capability.   Some vital signs may be absent or patient reported.   Willette Brace, LPN   Subjective:   Evolet Salminen Killings is a 80 y.o. female who presents for Medicare Annual (Subsequent) preventive examination.  Review of Systems     Cardiac Risk Factors include: advanced age (>2men, >35 women);hypertension;obesity (BMI >30kg/m2)     Objective:    There were no vitals filed for this visit. There is no height or weight on file to calculate BMI.  Advanced Directives 03/29/2021 03/11/2019 01/26/2019 02/01/2018 10/28/2017 08/06/2017 10/11/2016  Does Patient Have a Medical Advance Directive? Yes Yes No Yes Yes Yes Yes  Type of Advance Directive Healthcare Power of [place];Living will [place];Living will [place];Living will Living will;Healthcare Power of Attorney  Does patient want to make changes to medical advance directive? - No - Patient declined - No - Patient declined - No - Patient declined -  Copy of [place] in Chart? Yes - validated most recent copy scanned in chart (See row information) No - copy requested - No - copy  requested No - copy requested No - copy requested -  Would patient like information on creating a medical advance directive? - - No - Patient declined No - Patient declined - - -    Current Medications (verified) Outpatient Encounter Medications as of 03/29/2021  Medication Sig   Acetaminophen (TYLENOL PO) Take by mouth daily as needed.   Albuterol Sulfate (PROAIR RESPICLICK) 342 (90 Base) MCG/ACT AEPB Inhale 1-2 puffs into the lungs every 6 (six) hours as needed (for wheezing/shortness of breath).    aspirin EC 81 MG tablet Take 1 tablet (81 mg total) by mouth at bedtime.   cetirizine (ZYRTEC) 10 MG tablet Take 10 mg by mouth daily.   Cholecalciferol (VITAMIN D3) 2000 units TABS Take 2,000 Units by mouth daily.   ciclopirox (PENLAC) 8 % solution Apply topically at bedtime. Apply over nail and surrounding skin. Apply daily over previous coat. After seven (7) days, may remove with alcohol and continue cycle.   CINNAMON PO Take by mouth.   fluocinonide cream (LIDEX) 0.05 % APPLY SPARINGLY TO THE AFFECTED AREA(S) 3 TIMES DAILY FOR PSORIASIS FLARES. DO NOT USE LONGER THAN 2 WEEKS   fluticasone furoate-vilanterol (BREO ELLIPTA) 200-25 MCG/INH AEPB Inhale 1 puff into the lungs daily.   Glucosamine-Chondroitin (COSAMIN DS PO) Take 2 tablets by mouth daily.   hydrochlorothiazide (HYDRODIURIL) 25 MG tablet Take 0.5 tablets (12.5 mg total) by mouth daily.   metoprolol succinate (TOPROL-XL) 100 MG 24 hr tablet Take 1 tablet (100 mg total) by mouth daily. Take with or immediately following a meal.   montelukast (SINGULAIR) 10 MG tablet Take 10 mg by  mouth at bedtime.    Multiple Vitamin (MULTIVITAMIN) tablet Take 1 tablet by mouth daily. Senior Multivitamin   omeprazole (PRILOSEC) 20 MG capsule Take 20 mg by mouth daily.   OVER THE COUNTER MEDICATION Systane Ultra Eyedrops   rosuvastatin (CRESTOR) 5 MG tablet Take 0.5 tablets (2.5 mg total) by mouth every evening.   triamcinolone cream (KENALOG) 0.1 %  Apply 1 application topically 2 (two) times daily as needed (for skin rash (Summertime)).    azelastine (ASTELIN) 0.1 % nasal spray Place 1 spray into the nose daily as needed (for allergies.).  (Patient not taking: Reported on 03/29/2021)   Guaifenesin 1200 MG TB12 Take 1 tablet by mouth as needed. (Patient not taking: Reported on 03/29/2021)   [DISCONTINUED] fluticasone furoate-vilanterol (BREO ELLIPTA) 200-25 MCG/INH AEPB Inhale 1 puff into the lungs daily.   No facility-administered encounter medications on file as of 03/29/2021.    Allergies (verified) Amlodipine, Flexeril [cyclobenzaprine], Chlor-trimeton [chlorpheniramine], Ace inhibitors, Adhesive [tape], Angiotensin receptor blockers, Corn-containing products, Iodine, and Triamterene-hctz   History: Past Medical History:  Diagnosis Date   Allergic asthma, mild intermittent, uncomplicated 16/03/9603   Dr. Donneta Romberg, allergist   Arthritis    Chronic kidney disease    Stage III kidney disease   GERD (gastroesophageal reflux disease)    Hemorrhoids    History of kidney stones    Hypertension    IBS (irritable bowel syndrome)    IFG (impaired fasting glucose) 03/09/2019   a1c 6.3 02/2019   Nephrolithiasis 03/22/2021   Right ureteral stone; [facility] 2022   Psoriasis    Past Surgical History:  Procedure Laterality Date   ABDOMINAL HYSTERECTOMY  1987   CATARACT EXTRACTION W/ INTRAOCULAR LENS  IMPLANT, BILATERAL Bilateral 2015   [place] SURGERY  07/10/2017   TONSILLECTOMY     Family History  Problem Relation Age of Onset   Stroke Mother    Diabetes Father    Heart attack Father    Heart disease Father    Hyperlipidemia Father    Hypertension Father    Stroke Father    Diabetes Sister    Hypertension Sister    Transient ischemic attack Sister    COPD Maternal Grandmother    Heart disease Maternal Grandmother    Deep vein thrombosis Maternal Grandfather    Stroke Maternal  Grandfather    Deep vein thrombosis Paternal Grandmother    Hypertension Paternal Grandmother    Hypertension Daughter    Arthritis Maternal Aunt    COPD Maternal Aunt    Hypertension Maternal Aunt    Diabetes Paternal Uncle    Heart disease Paternal Uncle    Stroke Paternal Uncle    Breast cancer Neg Hx    Social History   Socioeconomic History   Marital status: Divorced    Spouse name: Not on file   Number of children: Not on file   Years of education: Not on file   Highest education level: Not on file  Occupational History   Occupation: Retired  Tobacco Use   Smoking status: Never   Smokeless tobacco: Never  Vaping Use   Vaping Use: Never used  Substance and Sexual Activity   Alcohol use: Yes    Comment: occassionally   Drug use: No   Sexual activity: Never  Other Topics Concern   Not on file  Social History Narrative   Not on file   Social Determinants of Health   Financial  Resource Strain: Low Risk    Difficulty of Paying Living Expenses: Not hard at all  Food Insecurity: No Food Insecurity   Worried About Charity fundraiser in the Last Year: Never true   Ran Out of Food in the Last Year: Never true  Transportation Needs: No Transportation Needs   Lack of Transportation (Medical): No   Lack of Transportation (Non-Medical): No  Physical Activity: Inactive   Days of Exercise per Week: 0 days   Minutes of Exercise per Session: 0 min  Stress: No Stress Concern Present   Feeling of Stress : Not at all  Social Connections: Moderately Isolated   Frequency of Communication with Friends and Family: More than three times a week   Frequency of Social Gatherings with Friends and Family: Not on file   Attends Religious Services: More than 4 times per year   Active Member of [employer] or Organizations: No   Attends Archivist Meetings: Never   Marital Status: Divorced    Tobacco Counseling Counseling given: Not Answered   Clinical Intake:  Pre-visit  preparation completed: Yes  Pain : No/denies pain     BMI - recorded: 30.07 Nutritional Status: BMI > 30  Obese Nutritional Risks: Nausea/ vomitting/ diarrhea (with IBS) Diabetes: No  How often do you need to have someone help you when you read instructions, pamphlets, or other written materials from your doctor or pharmacy?: 1 - Never  Diabetic?No  Interpreter Needed?: No  Information entered by :: Charlott Rakes, LPN   Activities of Daily Living In your present state of health, do you have any difficulty performing the following activities: 03/29/2021  Hearing? Y  Comment hearing loss noted  Vision? N  Difficulty concentrating or making decisions? N  Walking or climbing stairs? N  Dressing or bathing? N  Doing errands, shopping? N  Preparing Food and eating ? N  Using the Toilet? N  In the past six months, have you accidently leaked urine? N  Do you have problems with loss of bowel control? N  Managing your Medications? N  Managing your Finances? N  Housekeeping or managing your Housekeeping? N  Some recent data might be hidden    Patient Care Team: Leamon Arnt, MD as PCP - General (Family Medicine) Mosetta Anis, MD as Referring Physician (Allergy) Consuella Lose, MD as Consulting Physician (Neurosurgery) Mottinger, Sharyn Lull (Dentistry)  Indicate any recent Medical Services you may have received from other than [facility] providers in the past year (date may be approximate).     Assessment:   This is a routine wellness examination for [place].  Hearing/Vision screen Hearing Screening - Comments:: Pt stated Hearing loss and is following up with Audiologist  Vision Screening - Comments:: Pt follows up with Dr Kathrin Penner office for annual eye exams   Dietary issues and exercise activities discussed: Current Exercise Habits: The patient does not participate in regular exercise at present   Goals Addressed             This Visit's Progress     Patient Stated       Stand up and walk without pain        Depression Screen PHQ 2/9 Scores 03/29/2021 09/19/2020 03/23/2020 03/08/2019 10/19/2018 10/28/2017  PHQ - 2 Score 0 0 1 2 2  0  PHQ- 9 Score - - - 6 4 -    Fall Risk Fall Risk  03/29/2021 03/23/2020 09/21/2019 03/11/2019 12/29/2018  Falls in the past year? 0 0 0 0  0  Number falls in past yr: 0 0 0 0 0  Injury with Fall? 0 0 0 0 0  Risk for fall due to : Impaired vision;Impaired balance/gait;Impaired mobility Impaired vision;Impaired mobility;Impaired balance/gait - Impaired mobility -  Follow up Falls prevention discussed Falls prevention discussed - Education provided;Falls evaluation completed;Falls prevention discussed Falls evaluation completed    FALL RISK PREVENTION PERTAINING TO THE HOME:  Any stairs in or around the home? Yes  If so, are there any without handrails? No  Home free of loose throw rugs in walkways, pet beds, electrical cords, etc? Yes  Adequate lighting in your home to reduce risk of falls? Yes   ASSISTIVE DEVICES UTILIZED TO PREVENT FALLS:  Life alert? No  Use of a cane, walker or w/c? Yes  Grab bars in the bathroom? No  Shower chair or bench in shower? Yes  Elevated toilet seat or a handicapped toilet? No   TIMED UP AND GO:  Was the test performed? No .  Cognitive Function:     6CIT Screen 03/29/2021 03/23/2020  What Year? 0 points 0 points  What month? 0 points 0 points  What time? 0 points -  Count back from 20 0 points 0 points  Months in reverse 0 points 0 points  Repeat phrase 0 points 0 points  Total Score 0 -    Immunizations Immunization History  Administered Date(s) Administered   Fluad Quad(high Dose 65+) 03/08/2019, 03/27/2020, 03/22/2021   Influenza, High Dose Seasonal PF 03/02/2012, 04/06/2013, 04/21/2014, 04/16/2015, 03/06/2016, 04/09/2017, 03/01/2018   Influenza-Unspecified 04/12/2011   PFIZER(Purple Top)SARS-COV-2 Vaccination 07/02/2019, 07/23/2019, 09/16/2019,  03/06/2020, 03/20/2021   Pfizer Covid-19 Vaccine Bivalent Booster 37yrs & up 03/20/2021   Pneumococcal Conjugate-13 10/05/2014   Pneumococcal Polysaccharide-23 04/23/2010, 11/12/2015, 07/01/2016, 07/13/2017, 01/11/2018   Pneumococcal-Unspecified 04/23/2010   Tdap 05/23/2010   Zoster Recombinat (Shingrix) 11/18/2017, 03/23/2018   Zoster, Live 06/09/2004    TDAP status: Due, Education has been provided regarding the importance of this vaccine. Advised may receive this vaccine at local pharmacy or Health Dept. Aware to provide a copy of the vaccination record if obtained from local pharmacy or Health Dept. Verbalized acceptance and understanding.  Flu Vaccine status: Up to date  Pneumococcal vaccine status: Up to date  Covid-19 vaccine status: Completed vaccines  Qualifies for Shingles Vaccine? Yes   Zostavax completed Yes   Shingrix Completed?: Yes  Screening Tests Health Maintenance  Topic Date Due   TETANUS/TDAP  05/23/2020   COVID-19 Vaccine (6 - Booster for Pfizer series) 05/15/2021   MAMMOGRAM  11/07/2021   DEXA SCAN  04/28/2023   Pneumonia Vaccine 30+ Years old  Completed   INFLUENZA VACCINE  Completed   Zoster Vaccines- Shingrix  Completed   HPV VACCINES  Aged Out   Hepatitis C Screening  Discontinued    Health Maintenance  Health Maintenance Due  Topic Date Due   TETANUS/TDAP  05/23/2020    Colorectal cancer screening: No longer required.    Mammogram status: Completed 11/07/20. Repeat every year  Bone Density status: Completed 04/27/18. Results reflect: Bone density results: NORMAL. Repeat every 3-5 years.   Additional Screening:   Vision Screening: Recommended annual ophthalmology exams for early detection of glaucoma and other disorders of the eye. Is the patient up to date with their annual eye exam?  Yes  Who is the provider or what is the name of the office in which the patient attends annual eye exams? Dr Jearld Lesch office  If pt is not  established with a provider, would they like to be referred to a provider to establish care? No .   Dental Screening: Recommended annual dental exams for proper oral hygiene  Community Resource Referral / Chronic Care Management: CRR required this visit?  No   CCM required this visit?  No      Plan:     I have personally reviewed and noted the following in the patient's chart:   Medical and social history Use of alcohol, tobacco or illicit drugs  Current medications and supplements including opioid prescriptions.  Functional ability and status Nutritional status Physical activity Advanced directives List of other physicians Hospitalizations, surgeries, and ER visits in previous 12 months Vitals Screenings to include cognitive, depression, and falls Referrals and appointments  In addition, I have reviewed and discussed with patient certain preventive protocols, quality metrics, and best practice recommendations. A written personalized care plan for preventive services as well as general preventive health recommendations were provided to patient.     Willette Brace, LPN   [id number]   Nurse Notes: None

## 2021-04-01 ENCOUNTER — Telehealth: Payer: Self-pay | Admitting: Physician Assistant

## 2021-04-01 NOTE — Telephone Encounter (Signed)
Referral from Dr Jonni Sanger, scheduled 4/12; please work your magic + mark it as referral.

## 2021-04-01 NOTE — Telephone Encounter (Signed)
Referral attached to appointment

## 2021-04-11 DIAGNOSIS — Z961 Presence of intraocular lens: Secondary | ICD-10-CM | POA: Diagnosis not present

## 2021-04-11 DIAGNOSIS — H349 Unspecified retinal vascular occlusion: Secondary | ICD-10-CM | POA: Diagnosis not present

## 2021-04-11 DIAGNOSIS — H5203 Hypermetropia, bilateral: Secondary | ICD-10-CM | POA: Diagnosis not present

## 2021-04-18 ENCOUNTER — Other Ambulatory Visit: Payer: Self-pay | Admitting: Family Medicine

## 2021-04-29 DIAGNOSIS — N390 Urinary tract infection, site not specified: Secondary | ICD-10-CM | POA: Diagnosis not present

## 2021-04-29 DIAGNOSIS — N200 Calculus of kidney: Secondary | ICD-10-CM | POA: Diagnosis not present

## 2021-04-29 DIAGNOSIS — E669 Obesity, unspecified: Secondary | ICD-10-CM | POA: Diagnosis not present

## 2021-04-29 DIAGNOSIS — K589 Irritable bowel syndrome without diarrhea: Secondary | ICD-10-CM | POA: Diagnosis not present

## 2021-04-29 DIAGNOSIS — I129 Hypertensive chronic kidney disease with stage 1 through stage 4 chronic kidney disease, or unspecified chronic kidney disease: Secondary | ICD-10-CM | POA: Diagnosis not present

## 2021-04-29 DIAGNOSIS — N184 Chronic kidney disease, stage 4 (severe): Secondary | ICD-10-CM | POA: Diagnosis not present

## 2021-05-08 DIAGNOSIS — N200 Calculus of kidney: Secondary | ICD-10-CM | POA: Diagnosis not present

## 2021-08-20 IMAGING — MG MM DIGITAL SCREENING BILAT W/ TOMO AND CAD
8 series · 8 of 24 positions shown · non-contrast
Comparison: Previous exam(s).

CLINICAL DATA: Screening.

EXAM:
DIGITAL SCREENING BILATERAL MAMMOGRAM WITH TOMOSYNTHESIS AND CAD
TECHNIQUE: Bilateral screening digital craniocaudal and mediolateral oblique
mammograms were obtained. Bilateral screening digital breast
tomosynthesis was performed. The images were evaluated with
computer-aided detection.

[L CC synth-2D]
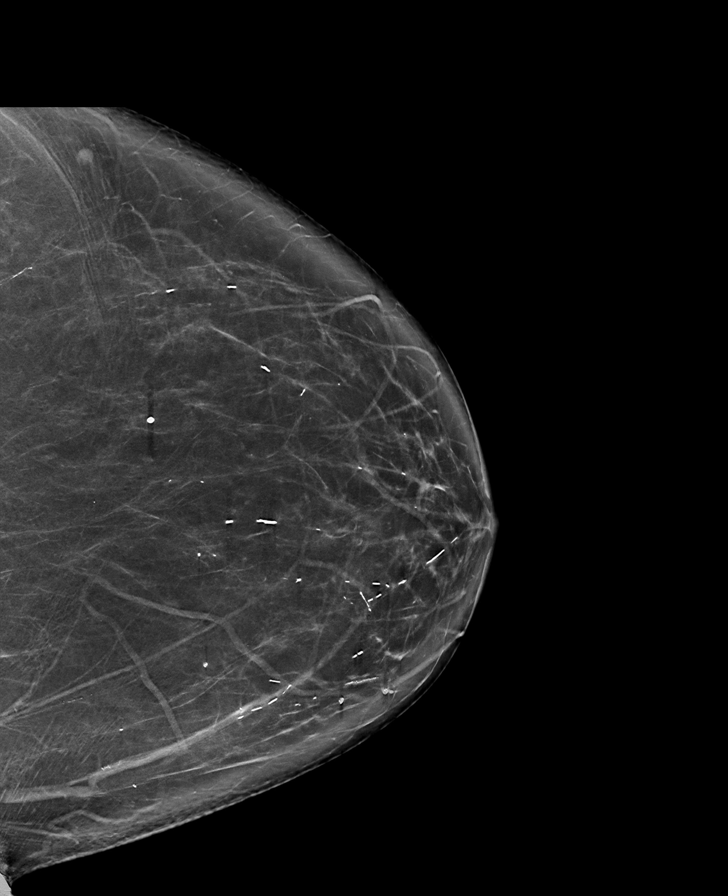

[R MLO synth-2D]
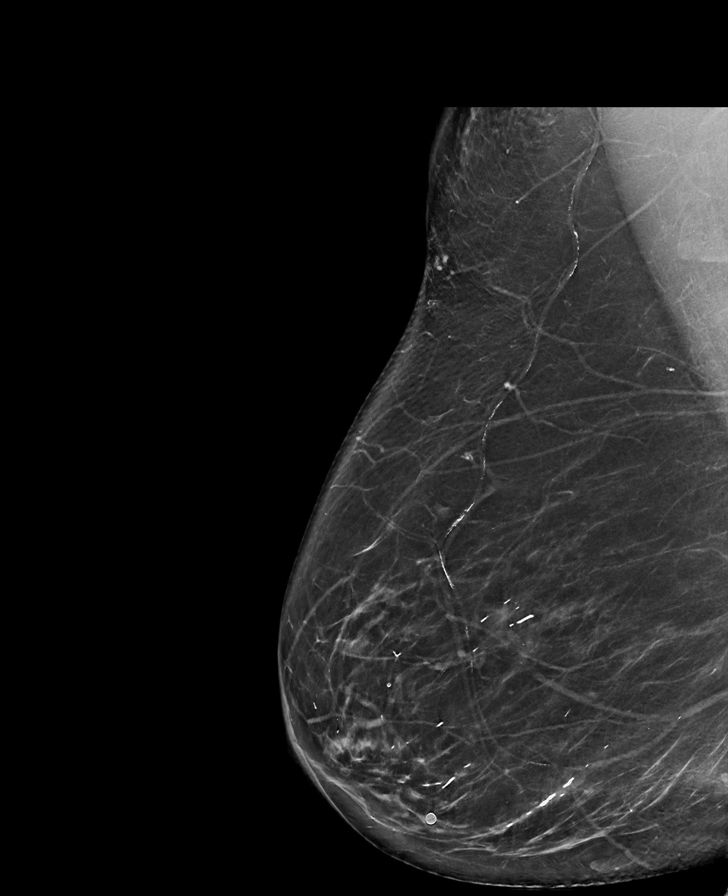

[L MLO synth-2D]
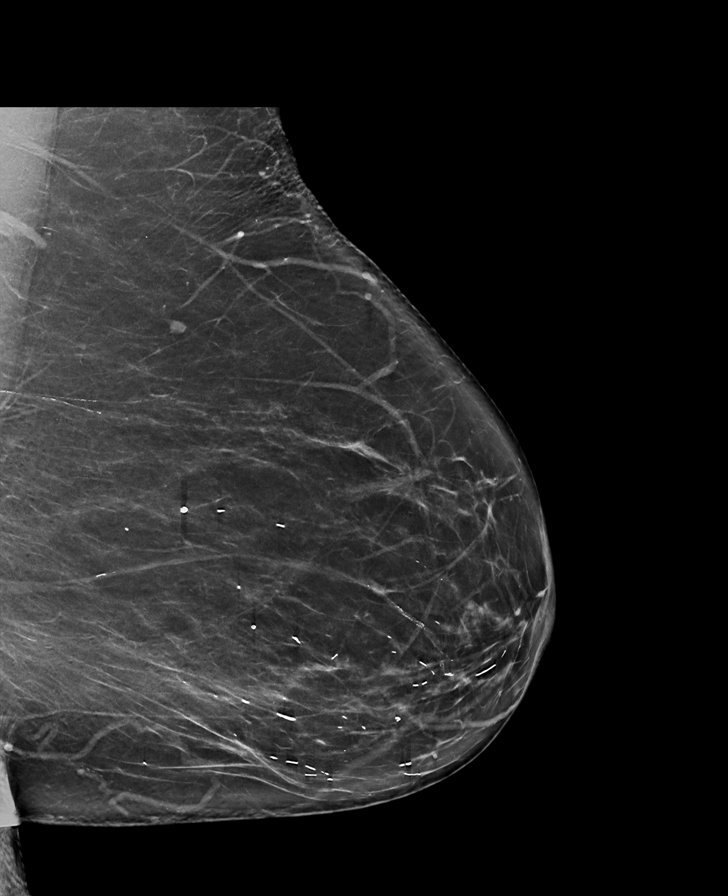

[R CC synth-2D]
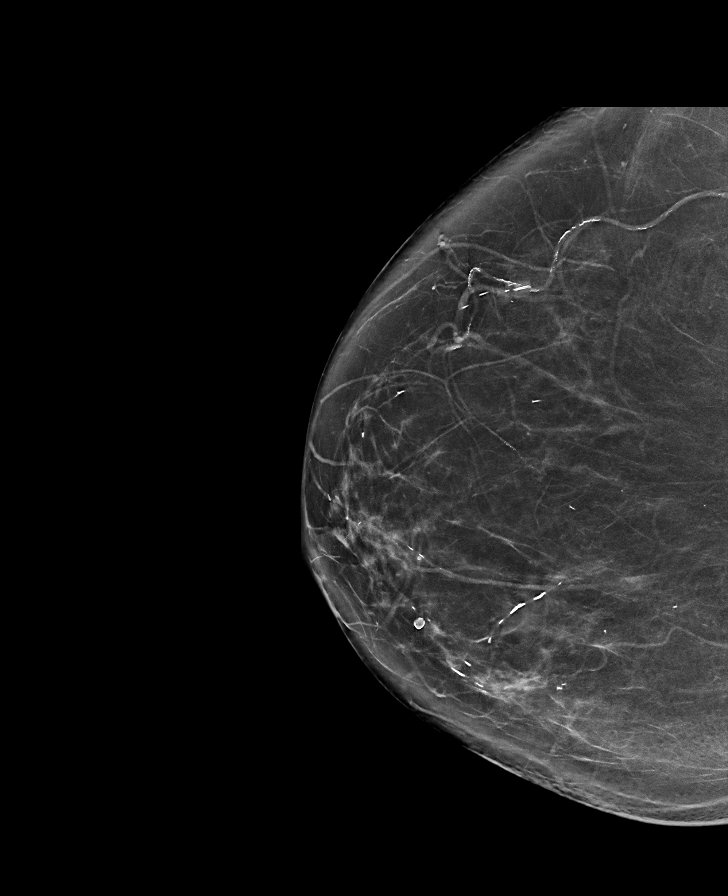

[L MLO tomo · tomo slice 43/84.0]
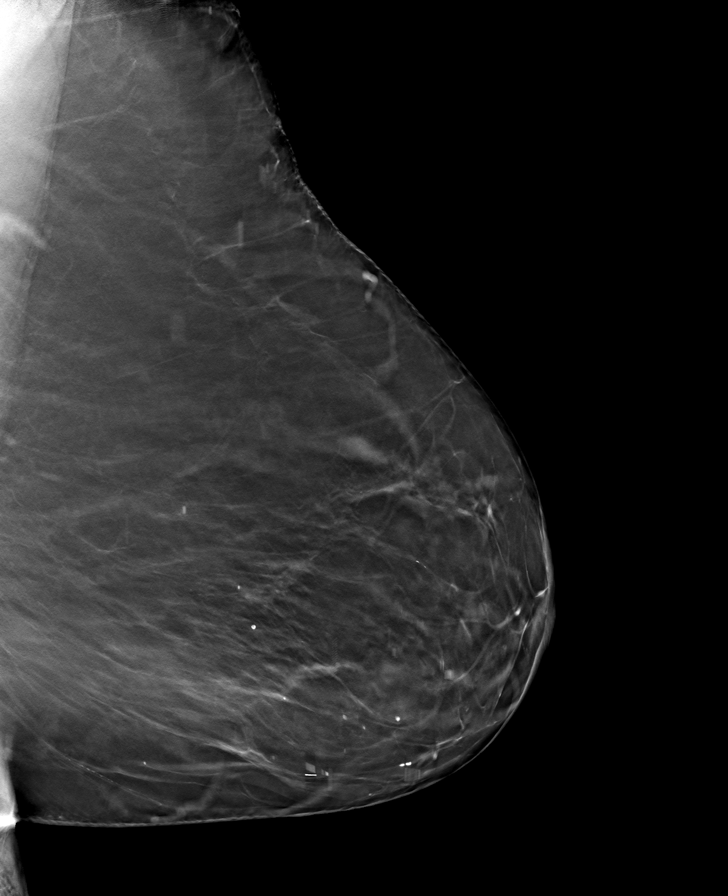

[R CC tomo · tomo slice 41/80.0]
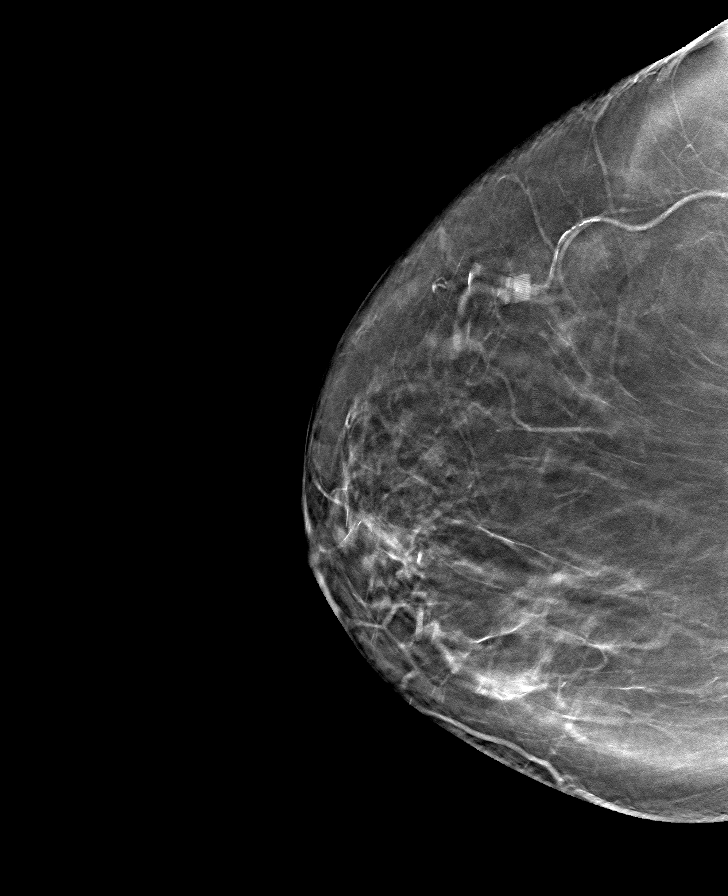

[R MLO tomo · tomo slice 45/90.0]
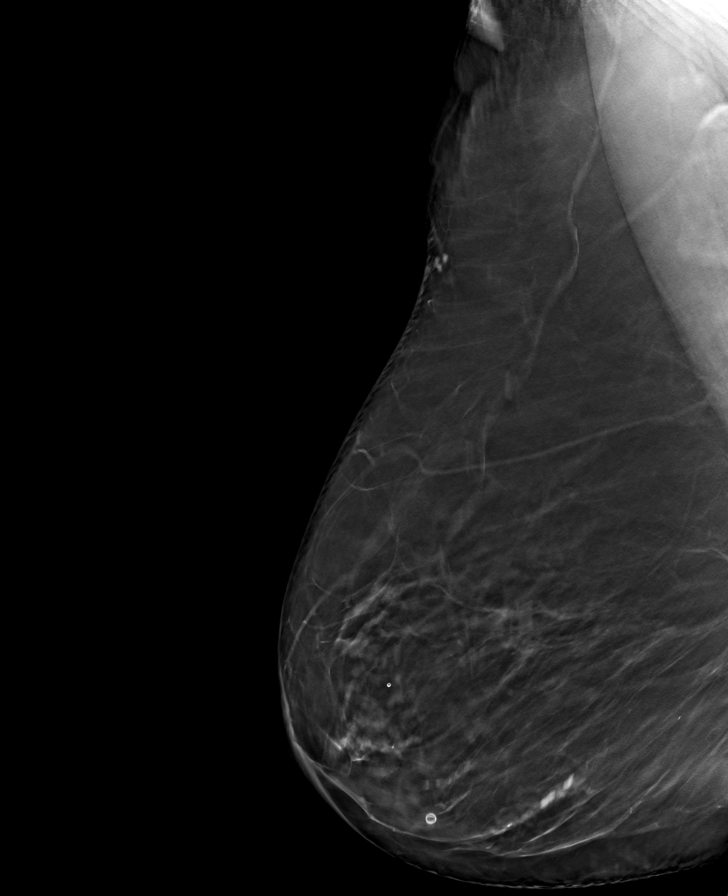

[L CC tomo · tomo slice 43/86.0]
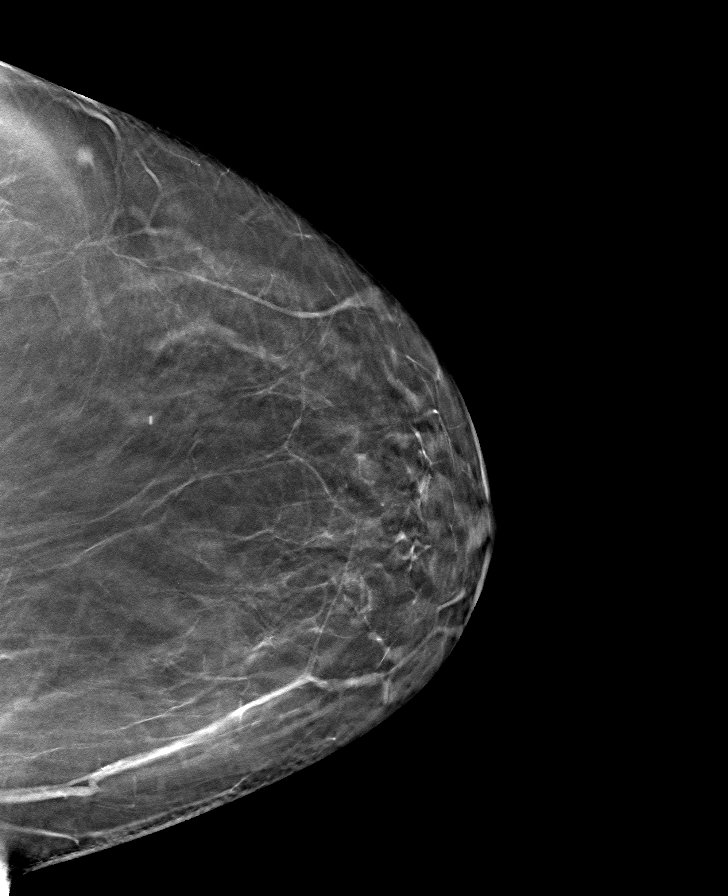

[8 of 24 positions shown; findings below may reference images not displayed]

ACR Breast Density Category b: There are scattered areas of
fibroglandular density.
FINDINGS: There are no findings suspicious for malignancy. The images were
evaluated with computer-aided detection.
IMPRESSION: No mammographic evidence of malignancy. A result letter of this
screening mammogram will be mailed directly to the patient.

RECOMMENDATION:
Screening mammogram in one year. (Code:[id number])

BI-RADS CATEGORY  1: Negative.

## 2021-09-18 ENCOUNTER — Ambulatory Visit: Payer: Medicare Other | Admitting: Physician Assistant

## 2021-09-18 ENCOUNTER — Encounter: Payer: Self-pay | Admitting: Physician Assistant

## 2021-09-18 DIAGNOSIS — L821 Other seborrheic keratosis: Secondary | ICD-10-CM

## 2021-09-18 DIAGNOSIS — Z1283 Encounter for screening for malignant neoplasm of skin: Secondary | ICD-10-CM | POA: Diagnosis not present

## 2021-09-18 NOTE — Progress Notes (Signed)
? ?  New Patient ?  ?Subjective  ?Sabrina Ramirez is a 81 y.o. female who presents for the following: Annual Exam (Right forehead- x months- annoying. No family or personal h/o of mealnoma or non mole skin cancers. ). ? ? ?The following portions of the chart were reviewed this encounter and updated as appropriate:  Tobacco  Allergies  Meds  Problems  Med Hx  Surg Hx  Fam Hx   ?  ? ?Objective  ?Well appearing patient in no apparent distress; mood and affect are within normal limits. ? ?A full examination was performed including scalp, head, eyes, ears, nose, lips, neck, chest, axillae, abdomen, back, buttocks, bilateral upper extremities, bilateral lower extremities, hands, feet, fingers, toes, fingernails, and toenails. All findings within normal limits unless otherwise noted below. ? ?Full body skin exam ? ?Left Forearm - Posterior ?Stuck-on, waxy papules and plaques.  ? ? ?Assessment & Plan  ?Seborrheic keratosis ?Left Forearm - Posterior ? ?Benign- ok to leave if stable. ? ?Screening for malignant neoplasm of skin ? ?Yearly skin exam.  ? ? ? ? ?I, Angle Karel, PA-C, have reviewed all documentation's for this visit.  The documentation on 09/18/21 for the exam, diagnosis, procedures and orders are all accurate and complete. ?

## 2021-09-24 ENCOUNTER — Ambulatory Visit: Payer: Medicare Other | Admitting: Family Medicine

## 2021-09-24 ENCOUNTER — Encounter: Payer: Self-pay | Admitting: Family Medicine

## 2021-09-24 VITALS — BP 170/84 | HR 54 | Temp 97.6°F | Ht 64.0 in | Wt 174.8 lb

## 2021-09-24 DIAGNOSIS — K219 Gastro-esophageal reflux disease without esophagitis: Secondary | ICD-10-CM | POA: Diagnosis not present

## 2021-09-24 DIAGNOSIS — I10 Essential (primary) hypertension: Secondary | ICD-10-CM | POA: Diagnosis not present

## 2021-09-24 DIAGNOSIS — K580 Irritable bowel syndrome with diarrhea: Secondary | ICD-10-CM | POA: Diagnosis not present

## 2021-09-24 DIAGNOSIS — N184 Chronic kidney disease, stage 4 (severe): Secondary | ICD-10-CM | POA: Diagnosis not present

## 2021-09-24 DIAGNOSIS — R7301 Impaired fasting glucose: Secondary | ICD-10-CM

## 2021-09-24 LAB — RENAL FUNCTION PANEL
Albumin: 4 g/dL (ref 3.5–5.2)
BUN: 37 mg/dL — ABNORMAL HIGH (ref 6–23)
CO2: 27 mEq/L (ref 19–32)
Calcium: 9.3 mg/dL (ref 8.4–10.5)
Chloride: 106 mEq/L (ref 96–112)
Creatinine, Ser: 2.3 mg/dL — ABNORMAL HIGH (ref 0.40–1.20)
GFR: 19.6 mL/min — ABNORMAL LOW (ref >60.00)
Glucose, Bld: 113 mg/dL — ABNORMAL HIGH (ref 70–99)
Phosphorus: 3.4 mg/dL (ref 2.3–4.6)
Potassium: 4.6 mEq/L (ref 3.5–5.1)
Sodium: 141 mEq/L (ref 135–145)

## 2021-09-24 LAB — HEPATIC FUNCTION PANEL
ALT: 16 U/L (ref 0–35)
AST: 19 U/L (ref 0–37)
Albumin: 4 g/dL (ref 3.5–5.2)
Alkaline Phosphatase: 74 U/L (ref 39–117)
Bilirubin, Direct: 0.1 mg/dL (ref 0.0–0.3)
Total Bilirubin: 0.7 mg/dL (ref 0.2–1.2)
Total Protein: 6.8 g/dL (ref 6.0–8.3)

## 2021-09-24 LAB — CBC WITH DIFFERENTIAL/PLATELET
Basophils Absolute: 0.1 10*3/uL (ref 0.0–0.1)
Basophils Relative: 1 % (ref 0.0–3.0)
Eosinophils Absolute: 0.3 10*3/uL (ref 0.0–0.7)
Eosinophils Relative: 3.5 % (ref 0.0–5.0)
HCT: 35 % — ABNORMAL LOW (ref 36.0–46.0)
Hemoglobin: 11.8 g/dL — ABNORMAL LOW (ref 12.0–15.0)
Lymphocytes Relative: 29.4 % (ref 12.0–46.0)
Lymphs Abs: 2.1 10*3/uL (ref 0.7–4.0)
MCHC: 33.6 g/dL (ref 30.0–36.0)
MCV: 94.8 fl (ref 78.0–100.0)
Monocytes Absolute: 0.6 10*3/uL (ref 0.1–1.0)
Monocytes Relative: 8.6 % (ref 3.0–12.0)
Neutro Abs: 4.1 10*3/uL (ref 1.4–7.7)
Neutrophils Relative %: 57.5 % (ref 43.0–77.0)
Platelets: 203 10*3/uL (ref 150.0–400.0)
RBC: 3.69 Mil/uL — ABNORMAL LOW (ref 3.87–5.11)
RDW: 13.3 % (ref 11.5–15.5)
WBC: 7.2 10*3/uL (ref 4.0–10.5)

## 2021-09-24 LAB — HEMOGLOBIN A1C: Hgb A1c MFr Bld: 6.2 % (ref 4.6–6.5)

## 2021-09-24 NOTE — Patient Instructions (Signed)
Please return in 3 months to recheck your blood pressure. ? ?I will release your lab results to you on your MyChart account with further instructions. You may see the results before I do, but when I review them I will send you a message with my report or have my assistant call you if things need to be discussed. Please reply to my message with any questions. Thank you!  ? ?Get back in with the kidney doctor.  ? ?Please start checking your blood pressures twice a [place] at different times and send me the results in the next week or two.  ? ? ?If you have any questions or concerns, please don't hesitate to send me a message via MyChart or call the office at [phone]. Thank you for visiting with [place] today! It's our pleasure caring for you.  ? ?Chronic Kidney Disease, Adult ?Chronic kidney disease (CKD) occurs when the kidneys are slowly and permanently damaged over a long period of time. The kidneys are a pair of organs that do many important jobs in the body, including: ?Removing waste and extra fluid from the blood to make urine. ?Making hormones that maintain the amount of fluid in tissues and blood vessels. ?Maintaining the right amount of fluids and chemicals in the body. ?A small amount of kidney damage may not cause problems, but a large amount of damage may make it hard or impossible for the kidneys to work right. Steps must be taken to slow kidney damage or to stop it from getting worse. If steps are not taken, the kidneys may stop working permanently (end-stage renal disease, or ESRD). Most of the time, CKD does not go away, but it can often be controlled. People who have CKD are usually able to live full lives. ?What are the causes? ?The most common causes of this condition are diabetes and high blood pressure (hypertension). ?Other causes include: ?Cardiovascular diseases. These affect the heart and blood vessels. ?Kidney diseases. These include: ?Glomerulonephritis, or inflammation of the tiny filters in  the kidneys. ?Interstitial nephritis. This is swelling of the small tubes of the kidneys and of the surrounding structures. ?Polycystic kidney disease, in which clusters of fluid-filled sacs form within the kidneys. ?Renal vascular disease. This includes disorders that affect the arteries and veins of the kidneys. ?Diseases that affect the body's defense system (immune system). ?A problem with urine flow. This may be caused by: ?Kidney stones. ?Cancer. ?An enlarged prostate, in males. ?A kidney infection or urinary tract infection (UTI) that keeps coming back. ?Vasculitis. This is swelling or inflammation of the blood vessels. ?What increases the risk? ?Your chances of having kidney disease increase with age. ?The following factors may make you more likely to develop this condition: ?A family history of kidney disease or kidney failure. Kidney failure means the kidneys can no longer work right. ?Certain genetic diseases. ?Taking medicines often that are damaging to the kidneys. ?Being around or being in contact with toxic substances. ?Obesity. ?A history of tobacco use. ?What are the signs or symptoms? ?Symptoms of this condition include: ?Feeling very tired (lethargic) and having less energy. ?Swelling, or edema, of the face, legs, ankles, or feet. ?Nausea or vomiting, or loss of appetite. ?Confusion or trouble concentrating. ?Muscle twitches and cramps, especially in the legs. ?Dry, itchy skin. ?A metallic taste in the mouth. ?Producing less urine, or producing more urine (especially at night). ?Shortness of breath. ?Trouble sleeping. ?CKD may also result in not having enough red blood cells or hemoglobin in  the blood (anemia) or having weak bones (bone disease). ?Symptoms develop slowly and may not be obvious until the kidney damage becomes severe. It is possible to have kidney disease for years without having symptoms. ?How is this diagnosed? ?This condition may be diagnosed based on: ?Blood tests. ?Urine  tests. ?Imaging tests, such as an ultrasound or a CT scan. ?A kidney biopsy. This involves removing a sample of kidney tissue to be looked at under a microscope. ?Results from these tests will help to determine how serious the CKD is. ?How is this treated? ?There is no cure for most cases of this condition, but treatment usually relieves symptoms and prevents or slows the worsening of the disease. Treatment may include: ?Diet changes, which may require you to avoid alcohol and foods that are high in salt, potassium, phosphorous, and protein. ?Medicines. These may: ?Lower blood pressure. ?Control blood sugar (glucose). ?Relieve anemia. ?Relieve swelling. ?Protect your bones. ?Improve the balance of salts and minerals in your blood (electrolytes). ?Dialysis, which is a type of treatment that removes toxic waste from the body. It may be needed if you have kidney failure. ?Managing any other conditions that are causing your CKD or making it worse. ?Follow these instructions at home: ?Medicines ?Take over-the-counter and prescription medicines only as told by your health care provider. The amount of some medicines that you take may need to be changed. ?Do not take any new medicines unless approved by your health care provider. Many medicines can make kidney damage worse. ?Do not take any vitamin and mineral supplements unless approved by your health care provider. Many nutritional supplements can make kidney damage worse. ?Lifestyle ? ?Do not use any products that contain nicotine or tobacco, such as cigarettes, e-cigarettes, and chewing tobacco. If you need help quitting, ask your health care provider. ?If you drink alcohol: ?Limit how much you use to: ?0-1 drink a Schermer for women who are not pregnant. ?0-2 drinks a Harshberger for men. ?Know how much alcohol is in your drink. In the [place] one drink equals one 12 oz bottle of beer (355 mL), one 5 oz glass of wine (148 mL), or one 1? oz glass of hard liquor (44 mL). ?Maintain a  healthy weight. If you need help, ask your health care provider. ?General instructions ? ?Follow instructions from your health care provider about eating or drinking restrictions, including any prescribed diet. ?Track your blood pressure at home. Report changes in your blood pressure as told. ?If you are being treated for diabetes, track your blood glucose levels as told. ?Start or continue an exercise plan. Exercise at least 30 minutes a Pac[place]owski, 5 days a week. ?Keep your immunizations up to date as told. ?Keep all follow-up visits. This is important. ?Where to find more information ?American Association of Kidney Patients: BombTimer.gl ?[place]: www.kidney.org ?[place]: https://mathis.com/ ?Life Options: www.lifeoptions.org ?[employer]: www.kidneyschool.org ?Contact a health care provider if: ?Your symptoms get worse. ?You develop new symptoms. ?Get help right away if: ?You develop symptoms of ESRD. These include: ?Headaches. ?Numbness in your hands or feet. ?Easy bruising. ?Frequent hiccups. ?Chest pain. ?Shortness of breath. ?Lack of menstrual periods, in women. ?You have a fever. ?You are producing less urine than usual. ?You have pain or bleeding when you urinate or when you have a bowel movement. ?These symptoms may represent a serious problem that is an emergency. Do not wait to see if the symptoms will go away. Get medical help right away. Call your local emergency  services (911 in the [place] Do not drive yourself to the hospital. ?Summary ?Chronic kidney disease (CKD) occurs when the kidneys become damaged slowly over a long period of time. ?The most common causes of this condition are diabetes and high blood pressure (hypertension). ?There is no cure for most cases of CKD, but treatment usually relieves symptoms and prevents or slows the worsening of the disease. Treatment may include a combination of lifestyle changes, medicines, and dialysis. ?This information is not intended  to replace advice given to you by your health care provider. Make sure you discuss any questions you have with your health care provider. ?Document Revised: 08/31/2019 Document Reviewed: 08/31/2019 ?[employer]

## 2021-09-24 NOTE — Progress Notes (Signed)
? ?Subjective  ?CC:  ?Chief Complaint  ?Patient presents with  ? Hypertension  ?  Pt her to F/U with Bp. She does not monitor it at home  ? ? ?HPI: Sabrina Ramirez is a 81 y.o. female who presents to the office today to address the problems listed above in the chief complaint. ?Hypertension and IBS f/u: Control is  uncertain. No longer checking at home. Had been well controlled . Pt reports she is doing well. taking medications as instructed, no medication side effects noted, no TIAs, no chest pain on exertion, no dyspnea on exertion, no swelling of ankles. She denies adverse effects from his BP medications. Compliance with medication is good. She has lost 40 pounds on fodmap diet for IBS which is now very well controlled. She is now starting to add back foods cautiously.  ?GERD: omeprazole ... other ppis don't work. Reports nephrology wanted her to change but sxs returned abruptly.  ?IFG: no sxs of high sugars now. Eating a very healthy diet.  ?CKD: need records from [facility]. No swelling, nausea. No nsaids.  ? ?Assessment  ?1. Uncontrolled hypertension   ?2. Chronic kidney disease, stage 4 (severe) (HCC)   ?3. IFG (impaired fasting glucose)   ?4. Irritable bowel syndrome with diarrhea   ?5. Gastroesophageal reflux disease, unspecified whether esophagitis present   ? ?  ?Plan  ? ?Hypertension f/u: BP control is poorly controlled. Will restart home blood pressure monitoring and she will send me readings next week. Will adjust medications at that time if remains elevated. Discussed importance of good bp control in setting of kidney disease.  ?CKD requesting records.  ?IFG: recheck. May have resolved with weight loss and dietary changes ?IBS and GERD f/u: both now well controlled.  ? ?Education regarding management of these chronic disease states was given. Management strategies discussed on successive visits include dietary and exercise recommendations, goals of achieving and maintaining IBW, and lifestyle  modifications aiming for adequate sleep and minimizing stressors.  ? ?Follow up: 3 mo for bp recheck ? ?Orders Placed This Encounter  ?Procedures  ? CBC with Differential/Platelet  ? Iron, TIBC and Ferritin Panel  ? Hemoglobin A1c  ? Renal function panel  ? Hepatic function panel  ? ?No orders of the defined types were placed in this encounter. ? ?  ? ?BP Readings from Last 3 Encounters:  ?09/24/21 (!) 170/84  ?03/22/21 124/68  ?01/08/21 (!) 172/60  ? ?Wt Readings from Last 3 Encounters:  ?09/24/21 174 lb 12.8 oz (79.3 kg)  ?03/22/21 175 lb 3.2 oz (79.5 kg)  ?09/19/20 188 lb 6.4 oz (85.5 kg)  ? ? ?Lab Results  ?Component Value Date  ? CHOL 116 03/22/2021  ? CHOL 137 03/27/2020  ? CHOL 124 03/08/2019  ? ?Lab Results  ?Component Value Date  ? HDL 42.70 03/22/2021  ? HDL 43 (L) 03/27/2020  ? HDL 41.80 03/08/2019  ? ?Lab Results  ?Component Value Date  ? LDLCALC 46 03/22/2021  ? [place] 67 03/27/2020  ? [place] 50 03/08/2019  ? ?Lab Results  ?Component Value Date  ? TRIG 138.0 03/22/2021  ? TRIG 199 (H) 03/27/2020  ? TRIG 159.0 (H) 03/08/2019  ? ?Lab Results  ?Component Value Date  ? CHOLHDL 3 03/22/2021  ? CHOLHDL 3.2 03/27/2020  ? CHOLHDL 3 03/08/2019  ? ?No results found for: LDLDIRECT ?Lab Results  ?Component Value Date  ? CREATININE 2.45 (H) 03/22/2021  ? BUN 53 (H) 03/22/2021  ? NA 138 03/22/2021  ?  K 4.1 03/22/2021  ? CL 105 03/22/2021  ? CO2 25 03/22/2021  ? ? ?The ASCVD Risk score (Arnett DK, et al., 2019) failed to calculate for the following reasons: ?  The 2019 ASCVD risk score is only valid for ages 66 to 79 ? ?I reviewed the patients updated PMH, FH, and SocHx.  ?  ?Patient Active Problem List  ? Diagnosis Date Noted  ? IFG (impaired fasting glucose) 03/09/2019  ?  Priority: High  ? Obesity, Class I, BMI 30-34.9 04/16/2015  ?  Priority: High  ? Hypertriglyceridemia 09/22/2011  ?  Priority: High  ? Benign essential hypertension 05/23/2010  ?  Priority: High  ? CKD (chronic kidney disease) stage 3, GFR 30-59  ml/min (HCC) 05/23/2010  ?  Priority: High  ? Localized primary osteoarthritis of carpometacarpal joint of left thumb 03/08/2019  ?  Priority: Medium   ? On statin therapy due to risk of future cardiovascular event 12/01/2017  ?  Priority: Medium   ? Spondylolisthesis at L5-S1 level 08/13/2017  ?  Priority: Medium   ? Colon polyp 05/25/2017  ?  Priority: Medium   ? Ophthalmic migraine 05/25/2017  ?  Priority: Medium   ? Essential tremor 05/25/2017  ?  Priority: Medium   ? Allergic asthma, mild persistent, uncomplicated 87/86/7672  ?  Priority: Medium   ? Irritable bowel syndrome with diarrhea 07/08/2010  ?  Priority: Medium   ? Chronic allergic rhinitis 02/25/2010  ?  Priority: Medium   ? GERD (gastroesophageal reflux disease) 02/25/2010  ?  Priority: Medium   ? Osteoarthritis, multiple sites 06/12/2009  ?  Priority: Medium   ? Conjunctivitis, allergic, chronic 10/01/2012  ?  Priority: Low  ? Rosacea 06/25/2011  ?  Priority: Low  ? Psoriasis 02/25/2010  ?  Priority: Low  ? Chronic kidney disease, stage 4 (severe) ([place]) 09/24/2021  ? Nephrolithiasis 03/22/2021  ? ? ?Allergies: Amlodipine, Flexeril [cyclobenzaprine], Chlor-trimeton [chlorpheniramine], Ace inhibitors, Adhesive [tape], Angiotensin receptor blockers, Corn-containing products, Iodine, and Triamterene-hctz ? ?Social History: ?Patient  reports that she has never smoked. She has never used smokeless tobacco. She reports current alcohol use. She reports that she does not use drugs. ? ?Current Meds  ?Medication Sig  ? Acetaminophen (TYLENOL PO) Take by mouth daily as needed.  ? Albuterol Sulfate (PROAIR RESPICLICK) 094 (90 Base) MCG/ACT AEPB Inhale 1-2 puffs into the lungs every 6 (six) hours as needed (for wheezing/shortness of breath).   ? aspirin EC 81 MG tablet Take 1 tablet (81 mg total) by mouth at bedtime.  ? cetirizine (ZYRTEC) 10 MG tablet Take 10 mg by mouth daily.  ? ciclopirox (PENLAC) 8 % solution Apply topically at bedtime. Apply over nail and  surrounding skin. Apply daily over previous coat. After seven (7) days, may remove with alcohol and continue cycle.  ? fluticasone furoate-vilanterol (BREO ELLIPTA) 200-25 MCG/INH AEPB Inhale 1 puff into the lungs daily.  ? Glucosamine-Chondroitin (COSAMIN DS PO) Take 2 tablets by mouth daily.  ? hydrochlorothiazide (HYDRODIURIL) 25 MG tablet Take 0.5 tablets (12.5 mg total) by mouth daily.  ? metoprolol succinate (TOPROL-XL) 100 MG 24 hr tablet Take 1 tablet (100 mg total) by mouth daily. Take with or immediately following a meal.  ? montelukast (SINGULAIR) 10 MG tablet Take 10 mg by mouth at bedtime.   ? Multiple Vitamin (MULTIVITAMIN) tablet Take 1 tablet by mouth daily. Senior Multivitamin  ? omeprazole (PRILOSEC) 20 MG capsule Take 20 mg by mouth daily.  ? OVER THE  COUNTER MEDICATION Systane Ultra Eyedrops  ? rosuvastatin (CRESTOR) 5 MG tablet Take 0.5 tablets (2.5 mg total) by mouth every evening.  ? triamcinolone cream (KENALOG) 0.1 % Apply 1 application topically 2 (two) times daily as needed (for skin rash (Summertime)).   ? [DISCONTINUED] fluocinonide cream (LIDEX) 0.05 % APPLY SPARINGLY TO THE AFFECTED AREAS 3 TIMES DAILY FR PSORIASIS FLARES. DO NOT USE LONGER THAN 2 WEEKS  ? ? ?Review of Systems: ?Cardiovascular: negative for chest pain, palpitations, leg swelling, orthopnea ?Respiratory: negative for SOB, wheezing or persistent cough ?Gastrointestinal: negative for abdominal pain ?Genitourinary: negative for dysuria or gross hematuria ? ?Objective  ?Vitals: BP (!) 170/84   Pulse (!) 54   Temp 97.6 ?F (36.4 ?C)   Ht 5\' 4"  (1.626 m)   Wt 174 lb 12.8 oz (79.3 kg)   SpO2 97%   BMI 30.00 kg/m?  ?General: no acute distress  ?Psych:  Alert and oriented, normal mood and affect ?HEENT:  Normocephalic, atraumatic, supple neck  ?Cardiovascular:  RRR without murmur. no edema ?Respiratory:  Good breath sounds bilaterally, CTAB with normal respiratory effort ?Skin:  Warm, no rashes ?Neurologic:   Mental status  is normal ?Commons side effects, risks, benefits, and alternatives for medications and treatment plan prescribed today were discussed, and the patient expressed understanding of the given instructions. Patient

## 2021-09-25 ENCOUNTER — Encounter: Payer: Self-pay | Admitting: Family Medicine

## 2021-09-25 DIAGNOSIS — D631 Anemia in chronic kidney disease: Secondary | ICD-10-CM | POA: Insufficient documentation

## 2021-09-25 LAB — IRON,TIBC AND FERRITIN PANEL
%SAT: 28 % (calc) (ref 16–45)
Ferritin: 74 ng/mL (ref 16–288)
Iron: 93 ug/dL (ref 45–160)
TIBC: 329 mcg/dL (calc) (ref 250–450)

## 2021-10-21 IMAGING — CT CT RENAL STONE PROTOCOL
2 of 4 series · 16 of 46 positions shown, 18 images · non-contrast
Comparison: None.

CLINICAL DATA: Right flank pain

EXAM:
CT ABDOMEN AND PELVIS WITHOUT CONTRAST
TECHNIQUE: Multidetector CT imaging of the abdomen and pelvis was performed
following the standard protocol without IV contrast.

[Series 3: ap without · axial · non-contrast · 0.78mm/px · z∈[-467,-87]mm · 13 of 86 slices shown, 15 images]
[im 5/86  soft-tissue]
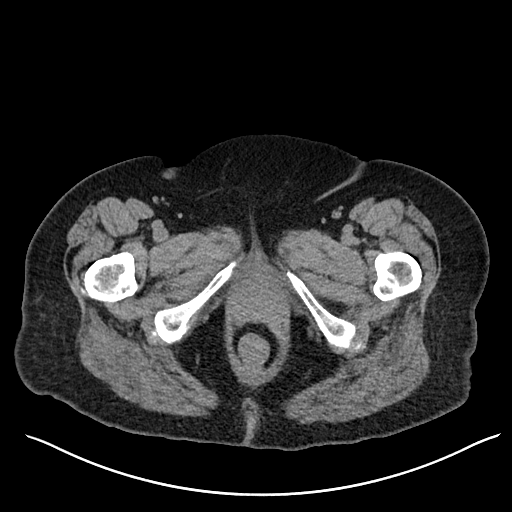
[im 5/86  bone]
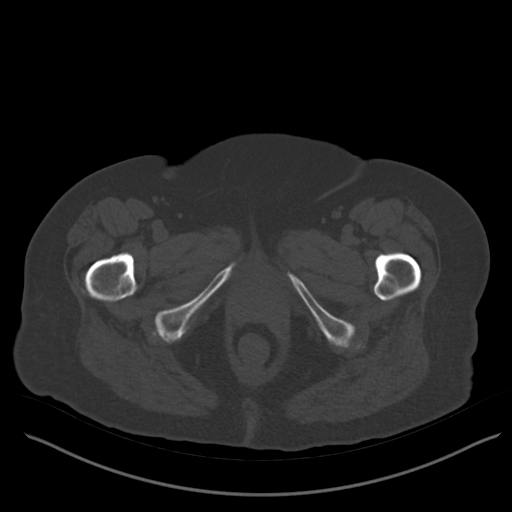
[im 14/86  soft-tissue]
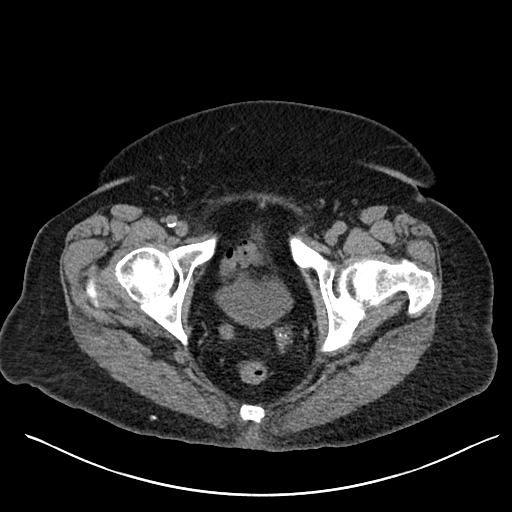
[im 18/86  soft-tissue]
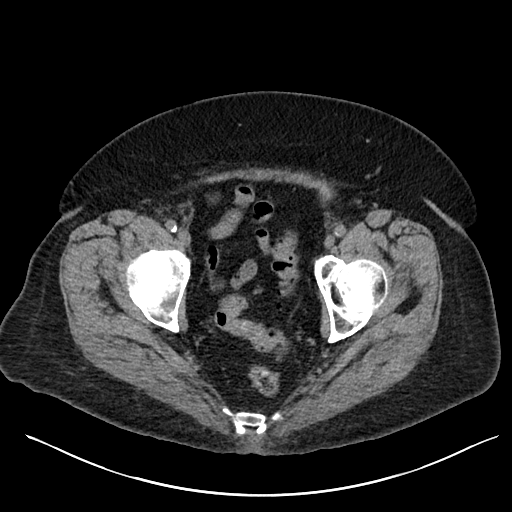
[im 23/86  soft-tissue]
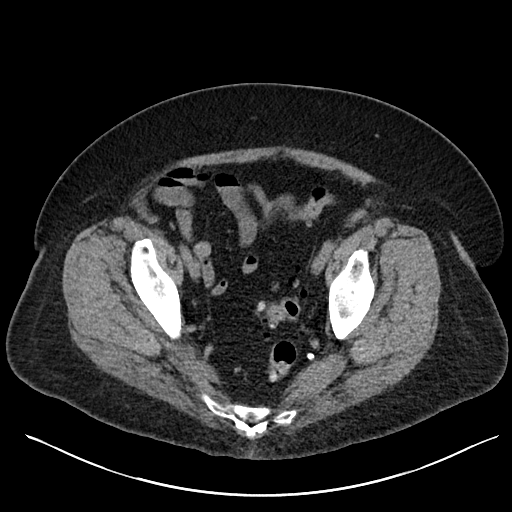
[im 32/86  soft-tissue]
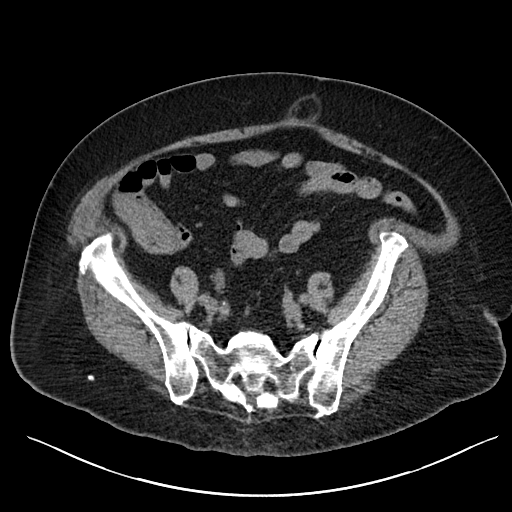
[im 36/86  soft-tissue]
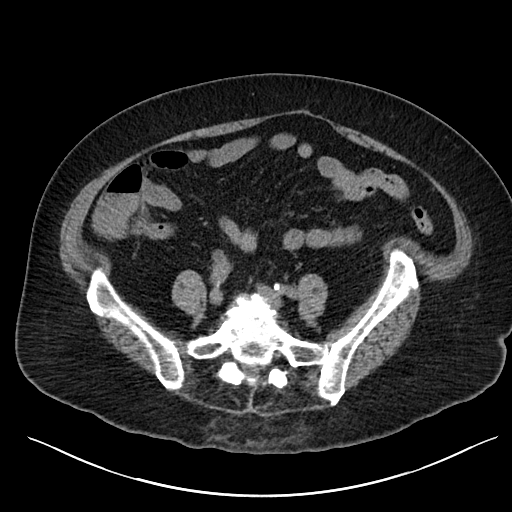
[im 45/86  soft-tissue]
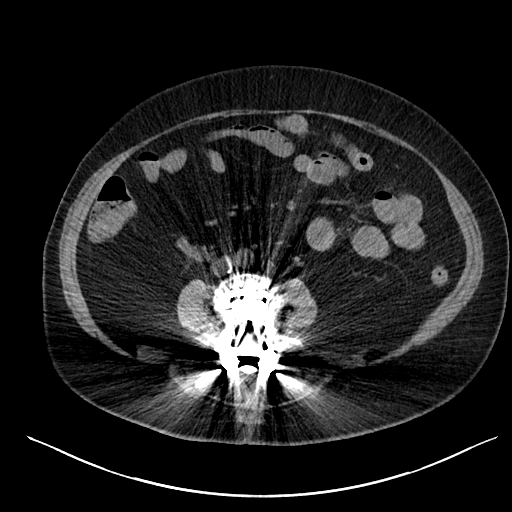
[im 50/86  soft-tissue]
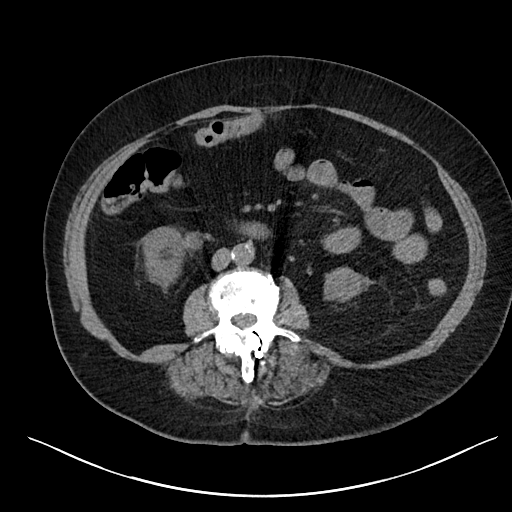
[im 54/86  soft-tissue]
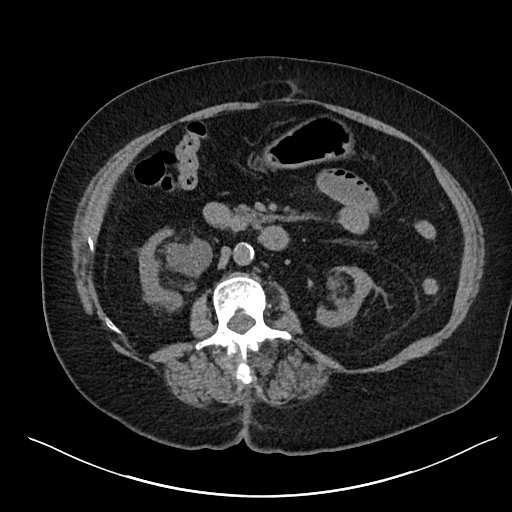
[im 54/86  bone]
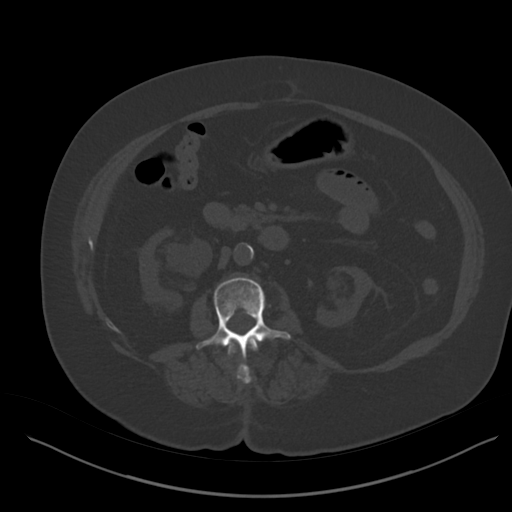
[im 63/86  soft-tissue]
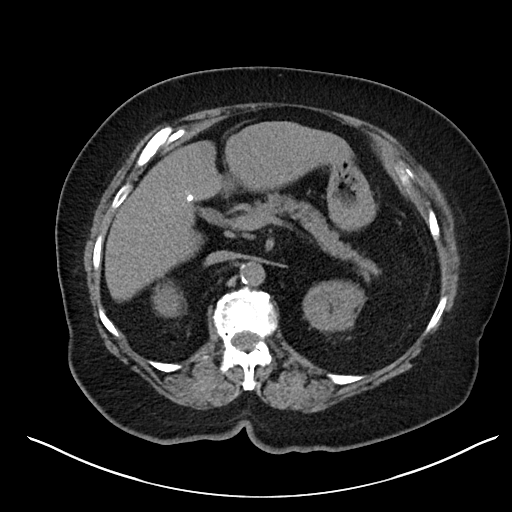
[im 68/86  soft-tissue]
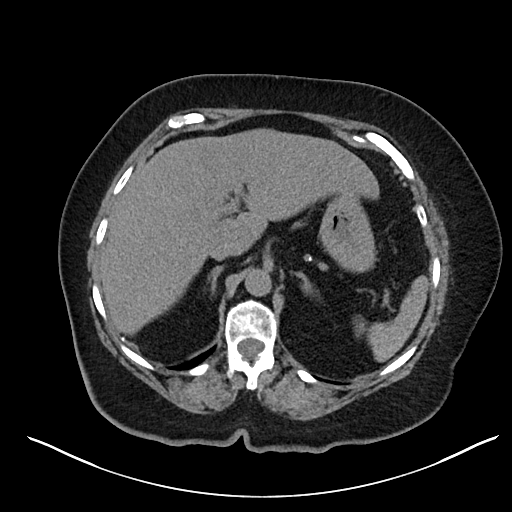
[im 72/86  soft-tissue]
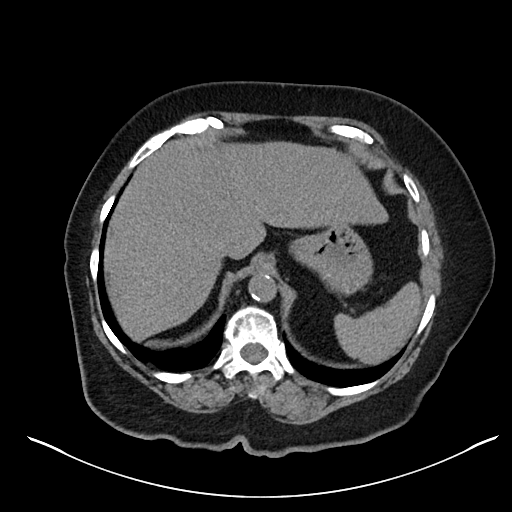
[im 81/86  soft-tissue]
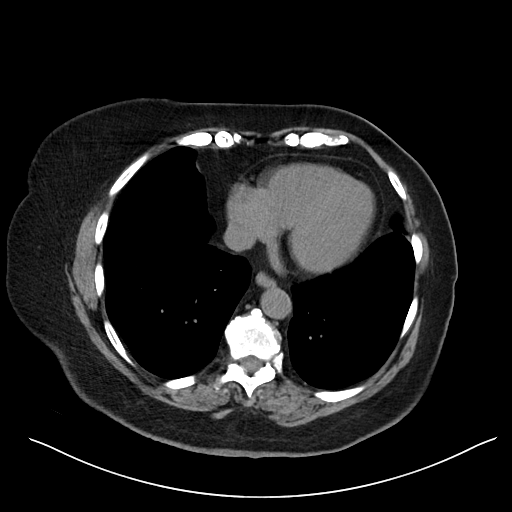

[Series 6: cor · coronal · 0.80mm/px · 3 of 107 slices shown]
[im 36/107  soft-tissue]
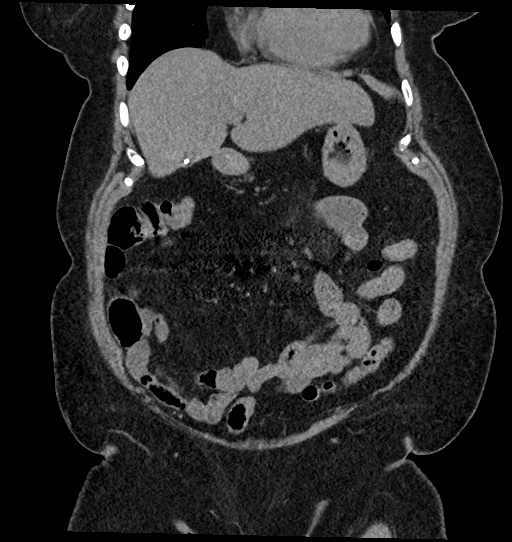
[im 48/107  soft-tissue]
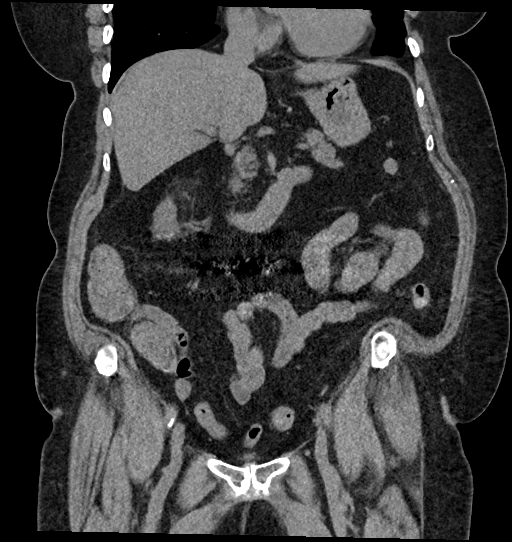
[im 59/107  soft-tissue]
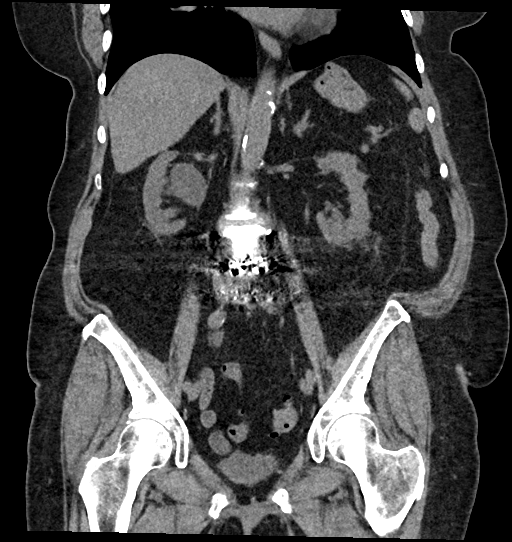

[16 of 46 positions shown; findings below may reference images not displayed]

FINDINGS: Lower chest: No acute pleural or parenchymal lung disease.

Unenhanced CT was performed per clinician order. Lack of IV contrast
limits sensitivity and specificity, especially for evaluation of
abdominal/pelvic solid viscera.

Hepatobiliary: No focal liver abnormality is seen. Status post
cholecystectomy. No biliary dilatation.

Pancreas: Unremarkable. No pancreatic ductal dilatation or
surrounding inflammatory changes.

Spleen: Normal in size without focal abnormality.

Adrenals/Urinary Tract: There is significant right-sided obstructive
uropathy due to a 7 mm obstructing mid right ureteral calculus,
reference image 58/3. There is also a nonobstructing 11 mm calculus
within the upper pole right kidney. No left-sided calculi.

There is bilateral renal cortical atrophy. The adrenals and bladder
are unremarkable.

Stomach/Bowel: No bowel obstruction or ileus. Diverticulosis of the
sigmoid colon without diverticulitis. No bowel wall thickening or
inflammatory change.

Vascular/Lymphatic: Aortic atherosclerosis. No enlarged abdominal or
pelvic lymph nodes.

Reproductive: Status post hysterectomy. No adnexal masses.

Other: No free fluid or free gas. Midline fat containing
supraumbilical ventral hernia. No bowel herniation.

Musculoskeletal: No acute or destructive bony lesions. Postsurgical
changes are seen from L4 through S1. There is diffuse spondylosis
and facet hypertrophy at the nonsurgical levels. Reconstructed
images demonstrate no additional findings.
IMPRESSION: 1. Right-sided obstructive uropathy due to a 7 mm obstructing mid
right ureteral calculus.
2. 11 mm nonobstructing right renal calculus as well.
3. Sigmoid diverticulosis without diverticulitis.
4. Midline fat containing supraumbilical ventral hernia.
5.  Aortic Atherosclerosis ([id number]).

## 2021-11-16 ENCOUNTER — Other Ambulatory Visit: Payer: Self-pay | Admitting: Family Medicine

## 2021-11-21 ENCOUNTER — Other Ambulatory Visit: Payer: Self-pay | Admitting: Family Medicine

## 2021-12-11 ENCOUNTER — Encounter: Payer: Self-pay | Admitting: Family Medicine

## 2021-12-13 ENCOUNTER — Other Ambulatory Visit: Payer: Self-pay | Admitting: Family Medicine

## 2021-12-25 ENCOUNTER — Ambulatory Visit: Payer: Medicare Other | Admitting: Family Medicine

## 2021-12-25 ENCOUNTER — Encounter: Payer: Self-pay | Admitting: Family Medicine

## 2021-12-25 VITALS — BP 136/56 | HR 64 | Temp 98.7°F | Ht 64.0 in | Wt 171.4 lb

## 2021-12-25 DIAGNOSIS — I10 Essential (primary) hypertension: Secondary | ICD-10-CM

## 2021-12-25 DIAGNOSIS — Z79899 Other long term (current) drug therapy: Secondary | ICD-10-CM | POA: Diagnosis not present

## 2021-12-25 DIAGNOSIS — N184 Chronic kidney disease, stage 4 (severe): Secondary | ICD-10-CM

## 2021-12-25 DIAGNOSIS — R7301 Impaired fasting glucose: Secondary | ICD-10-CM

## 2021-12-25 DIAGNOSIS — K580 Irritable bowel syndrome with diarrhea: Secondary | ICD-10-CM

## 2021-12-25 DIAGNOSIS — M4317 Spondylolisthesis, lumbosacral region: Secondary | ICD-10-CM

## 2021-12-25 DIAGNOSIS — D631 Anemia in chronic kidney disease: Secondary | ICD-10-CM

## 2021-12-25 NOTE — Progress Notes (Signed)
Subjective  CC:  Chief Complaint  Patient presents with   Hypertension    Pt here for 3mos F/U on BP. Pt will schedule Mammo    HPI: Sabrina Ramirez is a 81 y.o. female who presents to the office today to address the problems listed above in the chief complaint. Hypertension f/u: Control is good . Pt reports she is doing well. taking medications as instructed, no medication side effects noted, no TIAs, no chest pain on exertion, no dyspnea on exertion, no swelling of ankles, does note some dizziness when arising. Home readings show some low bp readings at home: 90s/50s. Says she doesn't drink that much water at times. On metoprolol xl 100 and hctz 12.5 daiy. She denies adverse effects from his BP medications. Compliance with medication is good.  GERD and h/o esophageal spasm: was told to stop omeprazole by renal however now with daily GERD sxs. Has tried multiple PPIs in past but only omeprazole has been helpful.  CKD: still haven't received notes from renal. Will request again. She does have a f/u appt scheduled. No edema. Avoids nsaids. Chronic low back pain s/p surgery: still with disability; requesting handicap placard.  Tolerates very low dose statin: crestor 2.5 daily  Assessment  1. Benign essential hypertension   2. On statin therapy due to risk of future cardiovascular event   3. IFG (impaired fasting glucose)   4. Spondylolisthesis at L5-S1 level   5. Irritable bowel syndrome with diarrhea   6. Chronic kidney disease, stage 4 (severe) (HCC)   7. Anemia associated with stage 4 chronic renal failure ([place])      Plan   Hypertension f/u: BP control is over controlled. Some symptomatic lows. Stop the hctz 12.5. likely has improved due to weight loss. Continue metoprolol 100 daily and home monitoring.  Handicap form completed. Recommend permanent IBS and GERD: will discuss with renal but due to chronic and severe nature, would recommend restarting omeprazole. Can consider GI  referral if needed.  CKD: request renal records and has f/u appt Pt to schedule mammogram.  IBS is now controlled.   Education regarding management of these chronic disease states was given. Management strategies discussed on successive visits include dietary and exercise recommendations, goals of achieving and maintaining IBW, and lifestyle modifications aiming for adequate sleep and minimizing stressors.   Follow up: Return for complete physical.  No orders of the defined types were placed in this encounter.  No orders of the defined types were placed in this encounter.     BP Readings from Last 3 Encounters:  12/25/21 (!) 136/56  09/24/21 (!) 170/84  03/22/21 124/68   Wt Readings from Last 3 Encounters:  12/25/21 171 lb 6.4 oz (77.7 kg)  09/24/21 174 lb 12.8 oz (79.3 kg)  03/22/21 175 lb 3.2 oz (79.5 kg)    Lab Results  Component Value Date   CHOL 116 03/22/2021   CHOL 137 03/27/2020   CHOL 124 03/08/2019   Lab Results  Component Value Date   HDL 42.70 03/22/2021   HDL 43 (L) 03/27/2020   HDL 41.80 03/08/2019   Lab Results  Component Value Date   LDLCALC 46 03/22/2021   LDLCALC 67 03/27/2020   LDLCALC 50 03/08/2019   Lab Results  Component Value Date   TRIG 138.0 03/22/2021   TRIG 199 (H) 03/27/2020   TRIG 159.0 (H) 03/08/2019   Lab Results  Component Value Date   CHOLHDL 3 03/22/2021   CHOLHDL 3.2 03/27/2020  CHOLHDL 3 03/08/2019   No results found for: "LDLDIRECT" Lab Results  Component Value Date   CREATININE 2.30 (H) 09/24/2021   BUN 37 (H) 09/24/2021   NA 141 09/24/2021   K 4.6 09/24/2021   CL 106 09/24/2021   CO2 27 09/24/2021    The ASCVD Risk score (Arnett DK, et al., 2019) failed to calculate for the following reasons:   The 2019 ASCVD risk score is only valid for ages 77 to 74  I reviewed the patients updated PMH, FH, and SocHx.    Patient Active Problem List   Diagnosis Date Noted   Anemia associated with stage 4 chronic renal  failure ([place]) 09/25/2021    Priority: High   Chronic kidney disease, stage 4 (severe) ([place]) 09/24/2021    Priority: High   IFG (impaired fasting glucose) 03/09/2019    Priority: High   Obesity, Class I, BMI 30-34.9 04/16/2015    Priority: High   Hypertriglyceridemia 09/22/2011    Priority: High   Benign essential hypertension 05/23/2010    Priority: High   Nephrolithiasis 03/22/2021    Priority: Medium    Localized primary osteoarthritis of carpometacarpal joint of left thumb 03/08/2019    Priority: Medium    On statin therapy due to risk of future cardiovascular event 12/01/2017    Priority: Medium    Spondylolisthesis at L5-S1 level 08/13/2017    Priority: Medium    Colon polyp 05/25/2017    Priority: Medium    Ophthalmic migraine 05/25/2017    Priority: Medium    Essential tremor 05/25/2017    Priority: Medium    Allergic asthma, mild persistent, uncomplicated 57/06/7791    Priority: Medium    Irritable bowel syndrome with diarrhea 07/08/2010    Priority: Medium    Chronic allergic rhinitis 02/25/2010    Priority: Medium    GERD (gastroesophageal reflux disease) 02/25/2010    Priority: Medium    Osteoarthritis, multiple sites 06/12/2009    Priority: Medium    Conjunctivitis, allergic, chronic 10/01/2012    Priority: Low   Rosacea 06/25/2011    Priority: Low   Psoriasis 02/25/2010    Priority: Low    Allergies: Amlodipine, Flexeril [cyclobenzaprine], Chlor-trimeton [chlorpheniramine], Ace inhibitors, Adhesive [tape], Angiotensin receptor blockers, Corn-containing products, Iodine, and Triamterene-hctz  Social History: Patient  reports that she has never smoked. She has never used smokeless tobacco. She reports current alcohol use. She reports that she does not use drugs.  Current Meds  Medication Sig   Acetaminophen (TYLENOL PO) Take by mouth daily as needed.   Albuterol Sulfate (PROAIR RESPICLICK) 903 (90 Base) MCG/ACT AEPB Inhale 1-2 puffs into the lungs every  6 (six) hours as needed (for wheezing/shortness of breath).    aspirin EC 81 MG tablet Take 1 tablet (81 mg total) by mouth at bedtime.   cetirizine (ZYRTEC) 10 MG tablet Take 10 mg by mouth daily.   ciclopirox (PENLAC) 8 % solution Apply topically at bedtime. Apply over nail and surrounding skin. Apply daily over previous coat. After seven (7) days, may remove with alcohol and continue cycle.   fluocinonide cream (LIDEX) 0.05 % APPLY  CREAM SPARINGLY TO AFFECTED AREA THREE TIMES DAILY FOR PSORIASIS FLARES. DO NOT USE LONGER THAN 2 WEEKS   fluticasone furoate-vilanterol (BREO ELLIPTA) 200-25 MCG/INH AEPB Inhale 1 puff into the lungs daily.   Glucosamine-Chondroitin (COSAMIN DS PO) Take 2 tablets by mouth daily.   metoprolol succinate (TOPROL-XL) 100 MG 24 hr tablet Take 1 tablet (100 mg total)  by mouth daily. Take with or immediately following a meal.   montelukast (SINGULAIR) 10 MG tablet Take 10 mg by mouth at bedtime.    Multiple Vitamin (MULTIVITAMIN) tablet Take 1 tablet by mouth daily. Senior Multivitamin   omeprazole (PRILOSEC) 20 MG capsule Take 20 mg by mouth daily.   OVER THE COUNTER MEDICATION Systane Ultra Eyedrops   rosuvastatin (CRESTOR) 5 MG tablet TAKE 1/2 (ONE-HALF) TABLET BY MOUTH IN THE EVENING   triamcinolone cream (KENALOG) 0.1 % Apply 1 application topically 2 (two) times daily as needed (for skin rash (Summertime)).    [DISCONTINUED] hydrochlorothiazide (HYDRODIURIL) 25 MG tablet Take 0.5 tablets (12.5 mg total) by mouth daily.    Review of Systems: Cardiovascular: negative for chest pain, palpitations, leg swelling, orthopnea Respiratory: negative for SOB, wheezing or persistent cough Gastrointestinal: negative for abdominal pain Genitourinary: negative for dysuria or gross hematuria  Objective  Vitals: BP (!) 136/56   Pulse 64   Temp 98.7 F (37.1 C)   Ht 5\' 4"  (1.626 m)   Wt 171 lb 6.4 oz (77.7 kg)   SpO2 98%   BMI 29.42 kg/m  General: no acute distress   Psych:  Alert and oriented, normal mood and affect HEENT:  Normocephalic, atraumatic, supple neck  Cardiovascular:  RRR without murmur. no edema Respiratory:  Good breath sounds bilaterally, CTAB with normal respiratory effort Skin:  Warm, no rashes Neurologic:   Mental status is normal Commons side effects, risks, benefits, and alternatives for medications and treatment plan prescribed today were discussed, and the patient expressed understanding of the given instructions. Patient is instructed to call or message via MyChart if he/she has any questions or concerns regarding our treatment plan. No barriers to understanding were identified. We discussed Red Flag symptoms and signs in detail. Patient expressed understanding regarding what to do in case of urgent or emergency type symptoms.  Medication list was reconciled, printed and provided to the patient in AVS. Patient instructions and summary information was reviewed with the patient as documented in the AVS. This note was prepared with assistance of Dragon voice recognition software. Occasional wrong-word or sound-a-like substitutions may have occurred due to the inherent limitations of voice recognition software  This visit occurred during the SARS-CoV-2 public health emergency.  Safety protocols were in place, including screening questions prior to the visit, additional usage of staff PPE, and extensive cleaning of exam room while observing appropriate contact time as indicated for disinfecting solutions.

## 2021-12-25 NOTE — Patient Instructions (Addendum)
Please return in 6 months for cpe  If you have any questions or concerns, please don't hesitate to send me a message via MyChart or call the office at [phone]. Thank you for visiting with [place] today! It's our pleasure caring for you.

## 2022-01-06 LAB — COMPREHENSIVE METABOLIC PANEL
Albumin: 4 (ref 3.5–5.0)
eGFR: 22

## 2022-01-06 LAB — BASIC METABOLIC PANEL
BUN: 42 — AB (ref 4–21)
Creatinine: 2.2 — AB (ref <=1.1)
Potassium: 4.2 mEq/L (ref 3.5–5.1)

## 2022-01-06 LAB — MICROALBUMIN / CREATININE URINE RATIO: Microalb Creat Ratio: 20

## 2022-01-06 LAB — CBC AND DIFFERENTIAL: Hemoglobin: 11.3 — AB (ref 12.0–16.0)

## 2022-01-06 LAB — PROTEIN / CREATININE RATIO, URINE: Albumin, U: 24.7

## 2022-01-14 ENCOUNTER — Encounter: Payer: Self-pay | Admitting: Family Medicine

## 2022-01-15 ENCOUNTER — Other Ambulatory Visit: Payer: Self-pay

## 2022-01-15 ENCOUNTER — Encounter (HOSPITAL_BASED_OUTPATIENT_CLINIC_OR_DEPARTMENT_OTHER): Payer: Self-pay | Admitting: Pediatrics

## 2022-01-15 ENCOUNTER — Emergency Department (HOSPITAL_BASED_OUTPATIENT_CLINIC_OR_DEPARTMENT_OTHER)
Admission: EM | Admit: 2022-01-15 | Discharge: 2022-01-15 | Disposition: A | Discharge status: Home / Self Care | Payer: Medicare Other | Attending: Emergency Medicine | Admitting: Emergency Medicine

## 2022-01-15 ENCOUNTER — Telehealth: Payer: Self-pay | Admitting: Family Medicine

## 2022-01-15 ENCOUNTER — Emergency Department (HOSPITAL_BASED_OUTPATIENT_CLINIC_OR_DEPARTMENT_OTHER): Payer: Medicare Other

## 2022-01-15 DIAGNOSIS — Z79899 Other long term (current) drug therapy: Secondary | ICD-10-CM | POA: Diagnosis not present

## 2022-01-15 DIAGNOSIS — Z7982 Long term (current) use of aspirin: Secondary | ICD-10-CM | POA: Diagnosis not present

## 2022-01-15 DIAGNOSIS — N184 Chronic kidney disease, stage 4 (severe): Secondary | ICD-10-CM | POA: Insufficient documentation

## 2022-01-15 DIAGNOSIS — M79671 Pain in right foot: Secondary | ICD-10-CM | POA: Insufficient documentation

## 2022-01-15 DIAGNOSIS — R109 Unspecified abdominal pain: Secondary | ICD-10-CM | POA: Insufficient documentation

## 2022-01-15 DIAGNOSIS — I129 Hypertensive chronic kidney disease with stage 1 through stage 4 chronic kidney disease, or unspecified chronic kidney disease: Secondary | ICD-10-CM | POA: Diagnosis not present

## 2022-01-15 DIAGNOSIS — M7989 Other specified soft tissue disorders: Secondary | ICD-10-CM | POA: Diagnosis present

## 2022-01-15 MED ORDER — CEPHALEXIN 250 MG PO CAPS: 250.0000 mg | ORAL_CAPSULE | Freq: Two times a day (BID) | ORAL | 0 refills | Status: AC

## 2022-01-15 NOTE — Discharge Instructions (Addendum)
You were evaluated in the Emergency Department and after careful evaluation, we did not find any emergent condition requiring admission or further testing in the hospital.  As discussed, we are unsure exactly what is causing your symptoms but we will treat you prophylactically for a cellulitis or a bacterial infection of your skin.  Please take the antibiotics as directed until finished.  You may also use Voltaren gel for inflammation.  You may also take Tylenol for pain.  Please return to the ER if you have any worsening symptoms.  We recommend that you follow-up with your primary care doctor for this. Thank you for allowing [place] to be a part of your care.

## 2022-01-15 NOTE — ED Provider Notes (Signed)
[facility] EMERGENCY DEPARTMENT Provider Note   CSN: [id number] Arrival date & time: 01/15/22  1106     History  Chief Complaint  Patient presents with   Foot Swelling    Sabrina Ramirez is a 81 y.o. female.  HPI 81 year old female with a history of hypertension, GERD, CKD stage IV, psoriasis presents to the ER with complaints of right flank pain.  Patient started to note pain to her mid foot and lateral right foot yesterday.  She went to bed hoping it would get better but the pain has continued.  She denies any inciting injury or fall.  She noticed some redness to the top of the foot and swelling.  She also feels like might be spreading to her left foot, complains to pain to the top of her left foot.  She denies any recent medication changes, soap changes, tick exposures.  She is vaccinated for shingles though did have shingles once prior to getting vaccinated.  She has not taken anything for pain.  No history of gout.  No fevers or chills.    Home Medications Prior to Admission medications   Medication Sig Start Date End Date Taking? Authorizing Provider  cephALEXin (KEFLEX) 250 MG capsule Take 1 capsule (250 mg total) by mouth 2 (two) times daily for 7 days. 01/15/22 01/22/22 Yes Garald Balding, PA-C  Acetaminophen (TYLENOL PO) Take by mouth daily as needed.    [provider]  Albuterol Sulfate (PROAIR RESPICLICK) 631 (90 Base) MCG/ACT AEPB Inhale 1-2 puffs into the lungs every 6 (six) hours as needed (for wheezing/shortness of breath).  06/04/16   [provider]  aspirin EC 81 MG tablet Take 1 tablet (81 mg total) by mouth at bedtime. 08/20/17   Consuella Lose, MD  cetirizine (ZYRTEC) 10 MG tablet Take 10 mg by mouth daily.    [provider]  ciclopirox (PENLAC) 8 % solution Apply topically at bedtime. Apply over nail and surrounding skin. Apply daily over previous coat. After seven (7) days, may remove with alcohol and continue cycle.  03/22/21   Leamon Arnt, MD  fluocinonide cream (LIDEX) 0.05 % APPLY  CREAM SPARINGLY TO AFFECTED AREA THREE TIMES DAILY FOR PSORIASIS FLARES. DO NOT USE LONGER THAN 2 WEEKS 12/16/21   Leamon Arnt, MD  fluticasone furoate-vilanterol (BREO ELLIPTA) 200-25 MCG/INH AEPB Inhale 1 puff into the lungs daily.    [provider]  Glucosamine-Chondroitin (COSAMIN DS PO) Take 2 tablets by mouth daily.    [provider]  metoprolol succinate (TOPROL-XL) 100 MG 24 hr tablet Take 1 tablet (100 mg total) by mouth daily. Take with or immediately following a meal. 03/22/21   Leamon Arnt, MD  montelukast (SINGULAIR) 10 MG tablet Take 10 mg by mouth at bedtime.  08/14/16   [provider]  Multiple Vitamin (MULTIVITAMIN) tablet Take 1 tablet by mouth daily. Senior Multivitamin    [provider]  omeprazole (PRILOSEC) 20 MG capsule Take 20 mg by mouth daily.    [provider]  OVER THE COUNTER MEDICATION Systane Ultra Eyedrops    [provider]  rosuvastatin (CRESTOR) 5 MG tablet TAKE 1/2 (ONE-HALF) TABLET BY MOUTH IN THE EVENING 11/18/21   Leamon Arnt, MD  triamcinolone cream (KENALOG) 0.1 % Apply 1 application topically 2 (two) times daily as needed (for skin rash (Summertime)).     [provider]      Allergies    Amlodipine, Flexeril [cyclobenzaprine], Chlor-trimeton [chlorpheniramine],  Ace inhibitors, Adhesive [tape], Angiotensin receptor blockers, Corn-containing products, Iodine, and Triamterene-hctz    Review of Systems   Review of Systems Ten systems reviewed and are negative for acute change, except as noted in the HPI.   Physical Exam Updated Vital Signs BP (!) 154/53 (BP Location: Left Arm)   Pulse 61   Temp 97.8 F (36.6 C) (Oral)   Resp 18   Ht 5\' 4"  (1.626 m)   Wt 77.7 kg   SpO2 100%   BMI 29.42 kg/m  Physical Exam Vitals and nursing note reviewed.  Constitutional:      General: She is not in acute  distress.    Appearance: She is well-developed.  HENT:     Head: Normocephalic and atraumatic.  Eyes:     Conjunctiva/sclera: Conjunctivae normal.  Cardiovascular:     Rate and Rhythm: Normal rate and regular rhythm.     Heart sounds: No murmur heard. Pulmonary:     Effort: Pulmonary effort is normal. No respiratory distress.     Breath sounds: Normal breath sounds.  Abdominal:     Palpations: Abdomen is soft.     Tenderness: There is no abdominal tenderness.  Musculoskeletal:        General: No swelling.     Cervical back: Neck supple.  Skin:    General: Skin is warm and dry.     Capillary Refill: Capillary refill takes less than 2 seconds.     Comments: Area of erythema to the dorsum of the foot over the MTP bones.  Some area of erythema to the right lateral foot with mild swelling.  No visible deformities or bruising.  Mild warmth.  Left foot with a similar appearing area of erythema to the dorsum.  Able to move all 5 digits without difficulty.  2+ DP pulses.  Sensation intact.  No ankle swelling or calf swelling bilaterally.  Neurological:     Mental Status: She is alert.  Psychiatric:        Mood and Affect: Mood normal.     ED Results / Procedures / Treatments   Labs (all labs ordered are listed, but only abnormal results are displayed) Labs Reviewed - No data to display  EKG None  Radiology DG Foot Complete Right  Result Date: 01/15/2022 CLINICAL DATA:  Pain and swelling. Pain around all metatarsals. Started yesterday. EXAM: RIGHT FOOT COMPLETE - 3+ VIEW COMPARISON:  None Available. FINDINGS: Normal bone mineralization. Joint spaces are preserved. Minimal mineralization along the distal aspect of the Achilles insertion on the calcaneus minimal enthesopathic changes. No acute fracture is seen. No dislocation. IMPRESSION: No significant abnormality. Electronically Signed   By: Yvonne Kendall M.D.   On: 01/15/2022 11:39    Procedures Procedures    Medications Ordered  in ED Medications - No data to display  ED Course/ Medical Decision Making/ A&P                           Medical Decision Making Amount and/or Complexity of Data Reviewed Radiology: ordered.   81 year old female presenting with pain to her right foot.  She has a long small erythematous area over the dorsum aspect of her right foot and her lateral foot.  She is tender to those areas.  Neurovascularly intact.  She also has a similar appearing but less intense area of erythema to the dorsum of the left foot.  Xrays ordered, reviewed and interpreted by me, agree with radiology  read.  No evidence of acute fractures or dislocations.  Presentation atypical for gout,, unlikely shingles given distribution.  Possible early cellulitis, will start on Keflex prophylactically.  Encouraged Voltaren gel for inflammation given kidney disease along with Tylenol.  Will renally dosed Keflex.  No evidence of systemic infection.  Do not think she needs any additional imaging of her foot at this time.  Encouraged PCP follow-up.  Discussed return precautions.  She voiced her standing and is agreeable.  Stable for discharge.  This was a shared visit with my supervising physician Dr. Regenia Skeeter who independently saw and evaluated the patient & provided guidance in evaluation/management/disposition ,in agreement with care  Final Clinical Impression(s) / ED Diagnoses Final diagnoses:  Right foot pain    Rx / DC Orders ED Discharge Orders          Ordered    cephALEXin (KEFLEX) 250 MG capsule  2 times daily        01/15/22 1251              Garald Balding, PA-C 01/15/22 1252    Sherwood Gambler, MD 01/16/22 [phone]

## 2022-01-15 NOTE — Telephone Encounter (Signed)
Patient Name: Susy Manor Montminy Gender: Female DOB: 06-Feb-1941 Age: 81 Y 30 M 10 D Return Phone Number: [phone] (Secondary) Address: City/ State/ Zip:  [facility] Provider Billey Chang- MD Contact Type Call Who Is Calling Patient / Member / Family / Caregiver Call Type Triage / Clinical Relationship To Patient Self Return Phone Number [phone] (Secondary) Chief Complaint Leg Swelling And Edema Reason for Call Symptomatic / Request for [place] states she is having foot pain and swelling. Additional Comment Zip [id number] Translation No Nurse Assessment Nurse: Rolin Barry, RN, Levada Dy Date/Time (Eastern Time): 01/15/2022 9:08:50 AM Confirm and document reason for call. If symptomatic, describe symptoms. ---Caller states she is having foot pain and swelling. Worse on the right foot, does not extend above the foot. Caller denies any injury. Does the patient have any new or worsening symptoms? ---Yes Will a triage be completed? ---Yes Related visit to physician within the last 2 weeks? ---No Does the PT have any chronic conditions? (i.e. diabetes, asthma, this includes High risk factors for pregnancy, etc.) ---Yes List chronic conditions. ---stage 4 kidney disease HTN Is this a behavioral health or substance abuse call? ---No Guidelines Guideline Title Affirmed Question Affirmed Notes Nurse Date/Time Eilene Ghazi Time) Leg Swelling and Edema [1] Can't walk or can barely walk AND [2] new-onset Rolin Barry, RN, Levada Dy 01/15/2022 9:10:02 AM Disp. Time Eilene Ghazi Time) Disposition Final User 01/15/2022 9:13:19 AM Go to ED Now Yes Deaton, RN, Levada Dy PLEASE NOTE: All timestamps contained within this report are represented as [place] Standard Time. CONFIDENTIALTY NOTICE: This fax transmission is intended only for the addressee. It contains information that  is legally privileged, confidential or otherwise protected from use or disclosure. If you are not the intended recipient, you are strictly prohibited from reviewing, disclosing, copying using or disseminating any of this information or taking any action in reliance on or regarding this information. If you have received this fax in error, please notify us immediately by telephone so that we can arrange for its return to [place]. Phone: [phone], Toll-Free: [phone], Fax: [phone] Page: 2 of 2 Call Id: [id number] Final Disposition 01/15/2022 9:13:19 AM Go to ED Now Yes Deaton, RN, Cindee Lame Disagree/Comply Comply Caller Understands Yes PreDisposition Did not know what to do Care Advice Given Per Guideline GO TO ED NOW: * You need to be seen in the Emergency Department. * Go to the ED at ___________ [place] now. Drive carefully. BRING MEDICINES: CARE ADVICE given per Leg Swelling and Edema (Adult) guideline. * If the patient cannot walk at all because of significant pain or leg weakness, then the patient will need to be transported via ambulance and examined at the emergency department. NOTE TO TRIAGER - AMBULANCE TRANSPORT IF UNABLE TO WALK: Comments User: Saverio Danker, RN Date/Time Eilene Ghazi Time): 01/15/2022 9:14:37 AM Caller states son is driving her. Referrals [facility] - ED

## 2022-01-15 NOTE — ED Notes (Signed)
ED Provider at bedside. 

## 2022-01-15 NOTE — ED Triage Notes (Signed)
C/O right foot pain + swelling started yesterday and worst today;

## 2022-01-15 NOTE — Telephone Encounter (Signed)
Patient states:  - She first experienced pain in her foot yesterday as well as swelling which caused her to limp around - Upon waking the pain and swelling had become worse and worrie dher - She is concerned she may have a DVT  - Declines experiencing additional symptoms  Due to onset of pain and swelling along with patient's concerns I sent pt to triage for further evaluation.

## 2022-01-17 ENCOUNTER — Ambulatory Visit: Payer: Medicare Other | Admitting: Internal Medicine

## 2022-01-17 ENCOUNTER — Encounter: Payer: Self-pay | Admitting: Internal Medicine

## 2022-01-17 DIAGNOSIS — M109 Gout, unspecified: Secondary | ICD-10-CM | POA: Insufficient documentation

## 2022-01-17 DIAGNOSIS — M10379 Gout due to renal impairment, unspecified ankle and foot: Secondary | ICD-10-CM | POA: Diagnosis not present

## 2022-01-17 HISTORY — DX: Gout, unspecified: M10.9

## 2022-01-17 MED ORDER — METHYLPREDNISOLONE ACETATE 80 MG/ML IJ SUSP
80.0000 mg | Freq: Once | INTRAMUSCULAR | Status: AC
  Administered 2022-01-17: 80 mg via INTRAMUSCULAR

## 2022-01-17 MED ORDER — PREDNISONE 20 MG PO TABS: ORAL_TABLET | ORAL | 0 refills | Status: DC

## 2022-01-17 MED ORDER — OXYCODONE-ACETAMINOPHEN 5-325 MG PO TABS: 1.0000 | ORAL_TABLET | ORAL | 0 refills | Status: AC | PRN

## 2022-01-17 NOTE — Patient Instructions (Addendum)
I think you have gout. It is causing the pain in feet and maybe kidney damage.  You cant have NSAIDS such as ibuprofen or aleve for it due to the kidney damage.. so I'm giving prednisone after steroid injection, and some opioid pain meds which are sedating and you shouldn't drive or mix with alcohol To prevent future attacks, you should follow a gout prevention diets, and start on allopurinol, which causes you to urinate out the chemical that crystalllizes in the joints (uric acid).  You need to return to discuss the allopurinol vs febuxostat in follow up appointment in 2 weeks.  For current attack you can take prednisone tablets which are anti-inflammatory, drink lots of fluid, use ice, use voltaren and lidocaine topical pain relief. If that isnt slowly improving the problem, then call for podiatry referral for steroid injections into foot.

## 2022-01-17 NOTE — Progress Notes (Signed)
Ronald Vinsant Kadel is a 81 y.o. female who presents today for an office visit.  Assessment/Plan:    Chief Complaint  Patient presents with   Follow-up    Pt here for ED f/u for swollen right foot on Wednesday. X-rays were done and was normal. No blood work done or CT scan. Thinks possibly could be shingles, gout related or skin infection    Thinks she had h/o gout 5 years ago Thinks she had shingles shot 20 years ago.  She has CKD4 and cannot have NSAIDS.  Based on my evaluation I concluded that this is 98% plus likely to be a gout flare.... with much lesser chance of some other cause of bilateral r>l foot inflammatory arthritis.  Problem List Items Addressed This Visit       Active Problems   Gout flare     I evaluated and gave her the following custom plan  I think you have gout. It is causing the pain in feet and maybe kidney damage.  You cant have Asuch as ibuprofen or aleve for it due to the kidney damage.. so I'm giving prednisone after steroid injection, and some opioid pain meds which are sedating and you shouldn't drive or mix with alcohol To prevent future attacks, you should follow a gout prevention diets, and start on allopurinol, which causes you to urinate out the chemical that crystalllizes in the joints (uric acid).  You need to return to discuss the allopurinol vs febuxostat in follow up appointment in 2 weeks.  For current attack you can take prednisone tablets which are anti-inflammatory, drink lots of fluid, use ice, use voltaren and lidocaine topical pain relief. If that isnt slowly improving the problem, then call for podiatry referral for steroid injections into foot.  Also gave uptodate handout       Relevant Medications   predniSONE (DELTASONE) 20 MG tablet   oxyCODONE-acetaminophen (PERCOCET/ROXICET) 5-325 MG tablet    Acute gout due to renal impairment involving foot, unspecified laterality Assessment & Plan:  I evaluated and gave her the following  custom plan  I think you have gout. It is causing the pain in feet and maybe kidney damage.  You cant have Asuch as ibuprofen or aleve for it due to the kidney damage.. so I'm giving prednisone after steroid injection, and some opioid pain meds which are sedating and you shouldn't drive or mix with alcohol To prevent future attacks, you should follow a gout prevention diets, and start on allopurinol, which causes you to urinate out the chemical that crystalllizes in the joints (uric acid).  You need to return to discuss the allopurinol vs febuxostat in follow up appointment in 2 weeks.  For current attack you can take prednisone tablets which are anti-inflammatory, drink lots of fluid, use ice, use voltaren and lidocaine topical pain relief. If that isnt slowly improving the problem, then call for podiatry referral for steroid injections into foot.  Also gave uptodate handout   Orders: -     methylPREDNISolone Acetate -     predniSONE; Take 2 pills for 3 days, 1 pill for 4 days  Dispense: 10 tablet; Refill: 0 -     oxyCODONE-Acetaminophen; Take 1 tablet by mouth every 4 (four) hours as needed for up to 5 days for severe pain.  Dispense: 15 tablet; Refill: 0   Gave injectable 80 mg methylprednisolone- start prednisone tomorrow, only use oxycodone if necessary (if steroids inadequate to manage pain)   Return in about 1  week (around 01/24/2022) for gout flare start uric acid treatment. uric acid level check.     Subjective:  HPI:  Patient reports debilitatingly painful feet, right much more painful that left, really hurts in the ams.  Swollen on lateral aspect of right mid foot.  Pain near mtp dorsal left midfoot.  Pain is excruciating- she can barely walk on the feet.         Objective:  Physical Exam: BP (!) 142/60 (BP Location: Left Arm)   Pulse (!) 55   Temp 97.8 F (36.6 C) (Temporal)   Ht 5\' 4"  (1.626 m)   Wt 173 lb 9.6 oz (78.7 kg)   SpO2 96%   BMI 29.80 kg/m    Gen: No  acute distress, resting comfortably, but grimace terribly and anxious with anything to touch the affected areas of her feet. Psych: Normal affect and thought content  Problem specific physical exam findings:  Exquisite tenderness and swelling with mild erythema lateral right midfoot- no rash Less tenderness but still can be exquisite on left foot distal dorsal metatarsals. She is hobbling, can barely walk due to such severe pain in feet with weight bearing.       Loralee Pacas, MD 01/17/2022 1:10 PM

## 2022-01-17 NOTE — Assessment & Plan Note (Signed)
  I evaluated and gave her the following custom plan  I think you have gout. It is causing the pain in feet and maybe kidney damage.  You cant have Asuch as ibuprofen or aleve for it due to the kidney damage.. so I'm giving prednisone after steroid injection, and some opioid pain meds which are sedating and you shouldn't drive or mix with alcohol To prevent future attacks, you should follow a gout prevention diets, and start on allopurinol, which causes you to urinate out the chemical that crystalllizes in the joints (uric acid).  You need to return to discuss the allopurinol vs febuxostat in follow up appointment in 2 weeks.  For current attack you can take prednisone tablets which are anti-inflammatory, drink lots of fluid, use ice, use voltaren and lidocaine topical pain relief. If that isnt slowly improving the problem, then call for podiatry referral for steroid injections into foot.  Also gave uptodate handout

## 2022-01-17 NOTE — Assessment & Plan Note (Deleted)
  I evaluated and gave her the following custom plan  I think you have gout. It is causing the pain in feet and maybe kidney damage.  You cant have NSAIDS such as ibuprofen or aleve for it due to the kidney damage.. so I'm giving prednisone after steroid injection, and some opioid pain meds which are sedating and you shouldn't drive or mix with alcohol To prevent future attacks, you should follow a gout prevention diets, and start on allopurinol, which causes you to urinate out the chemical that crystalllizes in the joints (uric acid).  You need to return to discuss the allopurinol vs febuxostat in follow up appointment in 2 weeks.  For current attack you can take prednisone tablets which are anti-inflammatory, drink lots of fluid, use ice, use voltaren and lidocaine topical pain relief. If that isnt slowly improving the problem, then call for podiatry referral for steroid injections into foot.  Also gave uptodate handout

## 2022-01-23 ENCOUNTER — Ambulatory Visit: Payer: Medicare Other | Admitting: Family Medicine

## 2022-01-24 ENCOUNTER — Encounter: Payer: Self-pay | Admitting: Internal Medicine

## 2022-01-24 ENCOUNTER — Ambulatory Visit: Payer: Medicare Other | Admitting: Internal Medicine

## 2022-01-24 VITALS — BP 140/60 | HR 50 | Temp 97.5°F | Ht 64.0 in | Wt 174.8 lb

## 2022-01-24 DIAGNOSIS — Z0000 Encounter for general adult medical examination without abnormal findings: Secondary | ICD-10-CM

## 2022-01-24 DIAGNOSIS — B353 Tinea pedis: Secondary | ICD-10-CM | POA: Diagnosis not present

## 2022-01-24 DIAGNOSIS — Z23 Encounter for immunization: Secondary | ICD-10-CM | POA: Diagnosis not present

## 2022-01-24 DIAGNOSIS — M10379 Gout due to renal impairment, unspecified ankle and foot: Secondary | ICD-10-CM | POA: Diagnosis not present

## 2022-01-24 DIAGNOSIS — J301 Allergic rhinitis due to pollen: Secondary | ICD-10-CM | POA: Insufficient documentation

## 2022-01-24 DIAGNOSIS — R059 Cough, unspecified: Secondary | ICD-10-CM | POA: Insufficient documentation

## 2022-01-24 DIAGNOSIS — J309 Allergic rhinitis, unspecified: Secondary | ICD-10-CM | POA: Insufficient documentation

## 2022-01-24 MED ORDER — CLOTRIMAZOLE 1 % EX CREA: TOPICAL_CREAM | Freq: Two times a day (BID) | CUTANEOUS | Status: DC

## 2022-01-24 NOTE — Assessment & Plan Note (Signed)
Nearly completely resolved I dont think more steroids needed now, but I offered She says she will call if she needs, if it starts to come back I considered getting uric acid today, but changed my mind when she mentioned she has large kidney stone and CKD4 - I decided to instead share this even with her nephrologist and let him discuss uricosuric agents and get the uric acid levels with next renal function panel.   I suspect this flare is related to her CKD4. I also gave extensive handouts on gout, gout diet and lifestyle change, and gout nephropathy for the patient to review. I also sent a note about all this to her nephrologist.

## 2022-01-24 NOTE — Patient Instructions (Signed)
It was a pleasure seeing you today!  Today the plan is...       Call if gout flare returns       Treat your athletes foot with the cream I sent       We will send message to Dr. Mauri Brooklyn  to help [place] decide about your gout prevention medicine plan, but for now just do the lifestyle stuff included  There is no need to make any changes to your medications yet, and we arent checking the uric acid yet.  Acute gout due to renal impairment involving foot, unspecified laterality  Tinea pedis, unspecified laterality   Gout  Gout is a condition that causes painful swelling of the joints. Gout is a type of inflammation of the joints (arthritis). This condition is caused by having too much uric acid in the body. Uric acid is a chemical that forms when the body breaks down substances called purines. Purines are important for building body proteins. When the body has too much uric acid, sharp crystals can form and build up inside the joints. This causes pain and swelling. Gout attacks can happen quickly and may be very painful (acute gout). Over time, the attacks can affect more joints and become more frequent (chronic gout). Gout can also cause uric acid to build up under the skin and inside the kidneys. What are the causes? This condition is caused by too much uric acid in your blood. This can happen because: Your kidneys do not remove enough uric acid from your blood. This is the most common cause. Your body makes too much uric acid. This can happen with some cancers and cancer treatments. It can also occur if your body is breaking down too many red blood cells (hemolytic anemia). You eat too many foods that are high in purines. These foods include organ meats and some seafood. Alcohol, especially beer, is also high in purines. A gout attack may be triggered by trauma or stress. What increases the risk? The following factors may make you more likely to develop this condition: Having a family history of  gout. Being female and middle-aged. Being female and having gone through menopause. Taking certain medicines, including aspirin, cyclosporine, diuretics, levodopa, and niacin. Having an organ transplant. Having certain conditions, such as: Being obese. Lead poisoning. Kidney disease. A skin condition called psoriasis. Other factors include: Losing weight too quickly. Being dehydrated. Frequently drinking alcohol, especially beer. Frequently drinking beverages that are sweetened with a type of sugar called fructose. What are the signs or symptoms? An attack of acute gout happens quickly. It usually occurs in just one joint. The most common place is the big toe. Attacks often start at night. Other joints that may be affected include joints of the feet, ankle, knee, fingers, wrist, or elbow. Symptoms of this condition may include: Severe pain. Warmth. Swelling. Stiffness. Tenderness. The affected joint may be very painful to touch. Shiny, red, or purple skin. Chills and fever. Chronic gout may cause symptoms more frequently. More joints may be involved. You may also have white or yellow lumps (tophi) on your hands or feet or in other areas near your joints. How is this diagnosed? This condition is diagnosed based on your symptoms, your medical history, and a physical exam. You may have tests, such as: Blood tests to measure uric acid levels. Removal of joint fluid with a thin needle (aspiration) to look for uric acid crystals. X-rays to look for joint damage. How is this treated? Treatment  for this condition has two phases: treating an acute attack and preventing future attacks. Acute gout treatment may include medicines to reduce pain and swelling, including: NSAIDs, such as ibuprofen. Steroids. These are strong anti-inflammatory medicines that can be taken by mouth (orally) or injected into a joint. Colchicine. This medicine relieves pain and swelling when it is taken soon after an  attack. It can be given by mouth or through an IV. Preventive treatment may include: Daily use of smaller doses of NSAIDs or colchicine. Use of a medicine that reduces uric acid levels in your blood, such as allopurinol. Changes to your diet. You may need to see a dietitian about what to eat and drink to prevent gout. Follow these instructions at home: During a gout attack  If directed, put ice on the affected area. To do this: Put ice in a plastic bag. Place a towel between your skin and the bag. Leave the ice on for 20 minutes, 2-3 times a [place]. Remove the ice if your skin turns bright red. This is very important. If you cannot feel pain, heat, or cold, you have a greater risk of damage to the area. Raise (elevate) the affected joint above the level of your heart as often as possible. Rest the joint as much as possible. If the affected joint is in your leg, you may be given crutches to use. Follow instructions from your health care provider about eating or drinking restrictions. Avoiding future gout attacks Follow a low-purine diet as told by your dietitian or health care provider. Avoid foods and drinks that are high in purines, including liver, kidney, anchovies, asparagus, herring, mushrooms, mussels, and beer. Maintain a healthy weight or lose weight if you are overweight. If you want to lose weight, talk with your health care provider. Do not lose weight too quickly. Start or maintain an exercise program as told by your health care provider. Eating and drinking Avoid drinking beverages that contain fructose. Drink enough fluids to keep your urine pale yellow. If you drink alcohol: Limit how much you have to: 0-1 drink a [place] for women who are not pregnant. 0-2 drinks a [place] for men. Know how much alcohol is in a drink. In the [place] one drink equals one 12 oz bottle of beer (355 mL), one 5 oz glass of wine (148 mL), or one 1 oz glass of hard liquor (44 mL). General instructions Take  over-the-counter and prescription medicines only as told by your health care provider. Ask your health care provider if the medicine prescribed to you requires you to avoid driving or using machinery. Return to your normal activities as told by your health care provider. Ask your health care provider what activities are safe for you. Keep all follow-up visits. This is important. Where to find more information [employer] of Health: www.niams.[employer].es Contact a health care provider if you have: Another gout attack. Continuing symptoms of a gout attack after 10 days of treatment. Side effects from your medicines. Chills or a fever. Burning pain when you urinate. Pain in your lower back or abdomen. Get help right away if you: Have severe or uncontrolled pain. Cannot urinate. Summary Gout is painful swelling of the joints caused by having too much uric acid in the body. The most common site for gout to occur is in the big toe, but it can affect other joints in the body. Medicines and dietary changes can help to prevent and treat gout attacks. This information is not intended to  replace advice given to you by your health care provider. Make sure you discuss any questions you have with your health care provider. Document Revised: 02/27/2021 Document Reviewed: 02/27/2021 Elsevier Patient Education  [place] A low-purine eating plan involves making food choices to limit your purine intake. Purine is a kind of uric acid. Too much uric acid in your blood can cause certain conditions, such as gout and kidney stones. Eating a low-purine diet may help control these conditions. What are tips for following this plan? Shopping Avoid buying products that contain high-fructose corn syrup. Check for this on food labels. It is commonly found in many processed foods and soft drinks. Be sure to check for it in baked goods such as cookies, canned fruits, and cereals  and cereal bars. Avoid buying veal, chicken breast with skin, lamb, and organ meats such as liver. These types of meats tend to have the highest purine content. Choose dairy products. These may lower uric acid levels. Avoid certain types of fish. Not all fish and seafood have high purine content. Examples with high purine content include anchovies, trout, tuna, sardines, and salmon. Avoid buying beverages that contain alcohol, particularly beer and hard liquor. Alcohol can affect the way your body gets rid of uric acid. Meal planning  Learn which foods do or do not affect you. If you find out that a food tends to cause your gout symptoms to flare up, avoid eating that food. You can enjoy foods that do not cause problems. If you have any questions about a food item, talk with your dietitian or health care provider. Reduce the overall amount of meat in your diet. When you do eat meat, choose ones with lower purine content. Include plenty of fruits and vegetables. Although some vegetables may have a high purine content--such as asparagus, mushrooms, spinach, or cauliflower--it has been shown that these do not contribute to uric acid blood levels as much. Consume at least 1 dairy serving a [place]. This has been shown to decrease uric acid levels. General information If you drink alcohol: Limit how much you have to: 0-1 drink a [place] for women who are not pregnant. 0-2 drinks a [place] for men. Know how much alcohol is in a drink. In the [place] one drink equals one 12 oz bottle of beer (355 mL), one 5 oz glass of wine (148 mL), or one 1 oz glass of hard liquor (44 mL). Drink plenty of water. Try to drink enough to keep your urine pale yellow. Fluids can help remove uric acid from your body. Work with your health care provider and dietitian to develop a plan to achieve or maintain a healthy weight. Losing weight may help reduce uric acid in your blood. What foods are recommended? The following are some types of  foods that are good choices when limiting purine intake: Fresh or frozen fruits and vegetables. Whole grains, breads, cereals, and pasta. Rice. Beans, peas, legumes. Nuts and seeds. Dairy products. Fats and oils. The items listed above may not be a complete list. Talk with a dietitian about what dietary choices are best for you. What foods are not recommended? Limit your intake of foods high in purines, including: Beer and other alcohol. Meat-based gravy or sauce. Canned or fresh fish, such as: Anchovies, sardines, herring, salmon, and tuna. Mussels and scallops. Codfish, trout, and haddock. Bacon, veal, chicken breast with skin, and lamb. Organ meats, such as: Liver or kidney. Tripe. Sweetbreads (thymus gland  or pancreas). Wild Clinical biochemist. Yeast or yeast extract supplements. Drinks sweetened with high-fructose corn syrup, such as soda. Processed foods made with high-fructose corn syrup. The items listed above may not be a complete list of foods and beverages you should limit. Contact a dietitian for more information. Summary Eating a low-purine diet may help control conditions caused by too much uric acid in the body, such as gout or kidney stones. Choose low-purine foods, limit alcohol, and limit high-fructose corn syrup. You will learn over time which foods do or do not affect you. If you find out that a food tends to cause your gout symptoms to flare up, avoid eating that food. This information is not intended to replace advice given to you by your health care provider. Make sure you discuss any questions you have with your health care provider. Document Revised: 05/09/2021 Document Reviewed: 05/09/2021 Elsevier Patient Education  [place].  Uric Acid Nephropathy  Uric acid is a chemical compound that is made when your body digests some kinds of food and also when your body breaks down dead cells. It is a waste product that is normally removed from your body by  your kidneys. If you have too much uric acid in your blood, it can build up in your kidneys and cause damage (nephropathy). There are two types of uric acid nephropathy: Sudden (acute) uric acid nephropathy results from a sudden buildup of uric acid. Long-term (chronic) uric acid nephropathy results from a slow buildup of uric acid over a long period of time. What are the causes? The exact cause of this condition may depend on the type of uric acid nephropathy: Acute uric acid nephropathy may be caused by: Receiving medicines for the treatment of cancer (chemotherapy). Use of these medicines causes rapid breakdown of cells. The cell breakdown produces excess uric acid. As uric acid builds up in your kidneys, it causes an increase of pressure and a loss of blood supply. This makes your kidneys less able to filter blood and make urine. A tumor (cancer) in the body. Seizures. Taking medicines that can cause excess uric acid. Examples are aspirin, water pills (diuretics), and medicines that are prescribed after an organ transplant. Severe diarrhea, which causes fluid loss (dehydration). Chronic uric acid nephropathy may happen if you have high levels of uric acid in your body on a regular basis. One reason you may have high levels of uric acid is gout. With gout, excess uric acid forms into crystals. These crystals can get stuck inside joints and cause painful swelling. They may also build up in your kidneys and cause long-term damage. What increases the risk? You may be more likely to develop this condition if you: Are female. Are 10 years old or older. Have gout. Eat a lot of foods that are high in certain natural chemical compounds (purines). Shellfish and red meat contain a lot of purines. Drink alcohol. Have recently had heart surgery. What are the signs or symptoms? Signs and symptoms depend on the type of nephropathy that you have. They may include: Decreased urine output. Nausea and  vomiting. Lack of energy. Seizures. Blood-tinged urine. Pain when passing urine. Pain in the sides of the lower back (flank pain). In some cases, there are no symptoms. How is this diagnosed? Your health care provider may suspect uric acid nephropathy from your signs and symptoms, especially if you have gout. He or she may: Do a physical exam. Order tests to confirm the diagnosis. Tests may  include: Blood and urine tests. This is the best way to measure high levels of uric acid. Imaging studies to check for kidney stones or kidney damage. These may include: X-rays. Ultrasound. CT scan. MRI. How is this treated? The goal of treatment is to lower the level of uric acid in your body and prevent kidney damage. This can be done by: Taking medicines that block the production of uric acid. The most commonly used medicine is allopurinol. If you are starting chemotherapy, ask your health care provider if you should start taking a medicine to prevent high uric acid. Starting a diet plan that lowers your intake of purines. Work with a diet and nutrition specialist (dietitian) to limit your intake of foods and drinks that increase uric acid. Preventing uric acid buildup. Drink plenty of water to maintain a good flow of urine and to lower the acidity of your urine. You may also need to take a medicine called bicarbonate. Resting the kidneys. This can be done by using a machine to clean your blood (hemodialysis), if necessary. In hemodialysis, your blood is removed, passed through a filtering machine, and then returned to your body. Several sessions of hemodialysis usually improve kidney function by removing uric acid. Follow these instructions at home: Eating and drinking     Drink enough fluid to keep your urine pale yellow. Do not drink alcohol. Do not drink beverages that contain a type of sugar called fructose. Limit how much red meat and shellfish you eat. Include plenty of low-fat dairy  foods in your diet. General instructions Take over-the-counter and prescription medicines only as told by your health care provider. Maintain a healthy weight. Lose weight as directed by your health care provider. Keep all follow-up visits as told by your health care provider. This is important. Contact a health care provider if you: Feel tired and have low energy, even when you get enough sleep. Have pain when passing urine. Have nausea or vomiting. Get help right away if you: Produce very little urine, even when you drink enough fluids. Have blood in your urine. Have a seizure. Summary Uric acid is a waste product that is normally removed from your body by your kidneys. If you have too much uric acid in your blood, it can build up in your kidneys and cause damage (nephropathy). Sudden (acute) uric acid nephropathy results from a sudden buildup of uric acid. Long-term (chronic) uric acid nephropathy results from a slow buildup of uric acid over a long period of time. This may happen if you have gout. The goal of treatment is to lower the level of uric acid in your body and prevent kidney damage. This information is not intended to replace advice given to you by your health care provider. Make sure you discuss any questions you have with your health care provider. Document Revised: 01/15/2021 Document Reviewed: 01/15/2021 Elsevier Patient Education  [place].

## 2022-01-24 NOTE — Progress Notes (Signed)
   Sabrina Ramirez is a 81 y.o. female who presents today for an office visit.  Assessment/Plan:  Overview: She is with stage 4 kidney disease and almost fully recovered from an acute gout flare in bilateral feet.  We might do allopurinol but it would need to be renally dosed.  No other gout flare since 1990s, so allopurinol is not urgent to start.  I reviewed lifestyle recommendations and almost check uric acid- but decide to wait for soon renal function panel.  Using losartan and atorvastatin have mild uricosuric effects and she  cant tolerate more than rosuvastatin 5 and not on losartan from nephro. Her bp was up today but was too low at last visit so I didn't change bp med.  She doesn't have pcp appt til January, so I think she should see nephro sooner. I think she should decide in discussion with Dr. Havery Moros what approach to take regarding uricosuric treatments   Sabrina Ramirez was seen today for follow-up.  Acute gout due to renal impairment involving foot, unspecified laterality Assessment & Plan: Nearly completely resolved I dont think more steroids needed now, but I offered She says she will call if she needs, if it starts to come back I considered getting uric acid today, but changed my mind when she mentioned she has large kidney stone and CKD4 - I decided to instead share this even with her nephrologist and let him discuss uricosuric agents and get the uric acid levels with next renal function panel.   I suspect this flare is related to her CKD4. I also gave extensive handouts on gout, gout diet and lifestyle change, and gout nephropathy for the patient to review. I also sent a note about all this to her nephrologist.   Tinea pedis, unspecified laterality -     Clotrimazole  Preventative health care -     Tdap vaccine greater than or equal to 7yo IM       Return if symptoms worsen or fail to improve.     Subjective:  HPI:  History taking was used to update the  overview section of each addressed problem in the assessment/plan section above.  - gout pain from last week 90-95% gone - finished steroid course yesterday - not drinking alcohol - avoiding meats and sugary drinks - has stone in kidney- wants to know if allopurinol might worsen.            Objective:  Physical Exam: BP (!) 140/60 (BP Location: Right Arm)   Pulse (!) 50   Temp (!) 97.5 F (36.4 C) (Temporal)   Ht 5\' 4"  (1.626 m)   Wt 174 lb 12.8 oz (79.3 kg)   SpO2 98%   BMI 30.00 kg/m    Very polite, friendly, elegant lady Gen: No acute distress, resting comfortably Psych: Normal affect and thought content  Problem specific physical exam findings:  Bruise on r forearm Total or near total resolution of inflamed areas on both feet Athletes foot changes noted.   Sabrina Pacas, MD 01/24/2022 12:42 PM

## 2022-02-05 ENCOUNTER — Telehealth: Payer: Self-pay | Admitting: Family Medicine

## 2022-02-05 NOTE — Telephone Encounter (Signed)
Patient states: -She had an OV with Dr. Randol Kern on 08/18 and he informed her he would send in clotrimazole  - She has been told by her pharmacy that they have not received that Rx order   Patient requests: -This medication be sent in to [facility] at [place], [place] ([place])  [id number]

## 2022-02-06 ENCOUNTER — Other Ambulatory Visit: Payer: Self-pay

## 2022-02-06 NOTE — Telephone Encounter (Signed)
Spoke with patient and informed her that the clotrimazole is an OTC cream and not an prescription.

## 2022-02-26 ENCOUNTER — Other Ambulatory Visit: Payer: Self-pay | Admitting: Family Medicine

## 2022-03-03 ENCOUNTER — Encounter: Payer: Self-pay | Admitting: *Deleted

## 2022-03-10 ENCOUNTER — Ambulatory Visit (INDEPENDENT_AMBULATORY_CARE_PROVIDER_SITE_OTHER): Payer: Medicare Other

## 2022-03-10 DIAGNOSIS — Z23 Encounter for immunization: Secondary | ICD-10-CM | POA: Diagnosis not present

## 2022-03-24 ENCOUNTER — Other Ambulatory Visit: Payer: Self-pay | Admitting: Family Medicine

## 2022-03-24 DIAGNOSIS — Z1231 Encounter for screening mammogram for malignant neoplasm of breast: Secondary | ICD-10-CM

## 2022-03-26 ENCOUNTER — Ambulatory Visit
Admission: RE | Admit: 2022-03-26 | Discharge: 2022-03-26 | Disposition: A | Discharge status: Home / Self Care | Payer: Medicare Other | Source: Ambulatory Visit | Attending: Family Medicine | Admitting: Family Medicine

## 2022-03-26 DIAGNOSIS — Z1231 Encounter for screening mammogram for malignant neoplasm of breast: Secondary | ICD-10-CM

## 2022-03-28 ENCOUNTER — Telehealth: Payer: Self-pay | Admitting: Family Medicine

## 2022-03-28 NOTE — Telephone Encounter (Signed)
Patient requests to have her most recent labs faxed to [facility].Fax# [phone]   Also, Patient states the Dermatologist that Patient was referred to has closed their office-would like to be advised as to what recommendation Dr. Jonni Sanger has for a new Dermatologist

## 2022-03-31 NOTE — Telephone Encounter (Signed)
Labs faxed internally to [facility].

## 2022-04-01 NOTE — Telephone Encounter (Signed)
Spoke with pt and she stated that she will look into one and let us know.

## 2022-04-08 ENCOUNTER — Other Ambulatory Visit: Payer: Self-pay | Admitting: Family Medicine

## 2022-04-24 ENCOUNTER — Ambulatory Visit (INDEPENDENT_AMBULATORY_CARE_PROVIDER_SITE_OTHER): Payer: Medicare Other

## 2022-04-24 VITALS — Wt 174.0 lb

## 2022-04-24 DIAGNOSIS — Z0000 Encounter for general adult medical examination without abnormal findings: Secondary | ICD-10-CM

## 2022-04-24 NOTE — Patient Instructions (Signed)
Sabrina Ramirez , Thank you for taking time to come for your Medicare Wellness Visit. I appreciate your ongoing commitment to your health goals. Please review the following plan we discussed and let me know if I can assist you in the future.   These are the goals we discussed:  Goals      Patient Stated     Maintain current health by increasing activity.      Patient Stated     Walk more     Patient Stated     Stand up and walk without pain      Patient Stated     Stay healthy and active         This is a list of the screening recommended for you and due dates:  Health Maintenance  Topic Date Due   COVID-19 Vaccine (6 - Pfizer risk series) 05/28/2022*   Mammogram  03/27/2023   Medicare Annual Wellness Visit  04/25/2023   DEXA scan (bone density measurement)  04/28/2023   Tetanus Vaccine  01/25/2032   Pneumonia Vaccine  Completed   Flu Shot  Completed   Zoster (Shingles) Vaccine  Completed   HPV Vaccine  Aged Out  *Topic was postponed. The date shown is not the original due date.    Advanced directives: Copies in chart   Conditions/risks identified: stay healthy and active   Next appointment: Follow up in one year for your annual wellness visit    Preventive Care 65 Years and Older, Female Preventive care refers to lifestyle choices and visits with your health care provider that can promote health and wellness. What does preventive care include? A yearly physical exam. This is also called an annual well check. Dental exams once or twice a year. Routine eye exams. Ask your health care provider how often you should have your eyes checked. Personal lifestyle choices, including: Daily care of your teeth and gums. Regular physical activity. Eating a healthy diet. Avoiding tobacco and drug use. Limiting alcohol use. Practicing safe sex. Taking low-dose aspirin every [place]. Taking vitamin and mineral supplements as recommended by your health care provider. What happens during  an annual well check? The services and screenings done by your health care provider during your annual well check will depend on your age, overall health, lifestyle risk factors, and family history of disease. Counseling  Your health care provider may ask you questions about your: Alcohol use. Tobacco use. Drug use. Emotional well-being. Home and relationship well-being. Sexual activity. Eating habits. History of falls. Memory and ability to understand (cognition). Work and work Statistician. Reproductive health. Screening  You may have the following tests or measurements: Height, weight, and BMI. Blood pressure. Lipid and cholesterol levels. These may be checked every 5 years, or more frequently if you are over 55 years old. Skin check. Lung cancer screening. You may have this screening every year starting at age 54 if you have a 30-pack-year history of smoking and currently smoke or have quit within the past 15 years. Fecal occult blood test (FOBT) of the stool. You may have this test every year starting at age 56. Flexible sigmoidoscopy or colonoscopy. You may have a sigmoidoscopy every 5 years or a colonoscopy every 10 years starting at age 22. Hepatitis C blood test. Hepatitis B blood test. Sexually transmitted disease (STD) testing. Diabetes screening. This is done by checking your blood sugar (glucose) after you have not eaten for a while (fasting). You may have this done every 1-3 years. Bone  density scan. This is done to screen for osteoporosis. You may have this done starting at age 8. Mammogram. This may be done every 1-2 years. Talk to your health care provider about how often you should have regular mammograms. Talk with your health care provider about your test results, treatment options, and if necessary, the need for more tests. Vaccines  Your health care provider may recommend certain vaccines, such as: Influenza vaccine. This is recommended every year. Tetanus,  diphtheria, and acellular pertussis (Tdap, Td) vaccine. You may need a Td booster every 10 years. Zoster vaccine. You may need this after age 29. Pneumococcal 13-valent conjugate (PCV13) vaccine. One dose is recommended after age 3. Pneumococcal polysaccharide (PPSV23) vaccine. One dose is recommended after age 34. Talk to your health care provider about which screenings and vaccines you need and how often you need them. This information is not intended to replace advice given to you by your health care provider. Make sure you discuss any questions you have with your health care provider. Document Released: 06/22/2015 Document Revised: 02/13/2016 Document Reviewed: 03/27/2015 Elsevier Interactive Patient Education  2017 [place] Prevention in the Home Falls can cause injuries. They can happen to people of all ages. There are many things you can do to make your home safe and to help prevent falls. What can I do on the outside of my home? Regularly fix the edges of walkways and driveways and fix any cracks. Remove anything that might make you trip as you walk through a door, such as a raised step or threshold. Trim any bushes or trees on the path to your home. Use bright outdoor lighting. Clear any walking paths of anything that might make someone trip, such as rocks or tools. Regularly check to see if handrails are loose or broken. Make sure that both sides of any steps have handrails. Any raised decks and porches should have guardrails on the edges. Have any leaves, snow, or ice cleared regularly. Use sand or salt on walking paths during winter. Clean up any spills in your garage right away. This includes oil or grease spills. What can I do in the bathroom? Use night lights. Install grab bars by the toilet and in the tub and shower. Do not use towel bars as grab bars. Use non-skid mats or decals in the tub or shower. If you need to sit down in the shower, use a plastic,  non-slip stool. Keep the floor dry. Clean up any water that spills on the floor as soon as it happens. Remove soap buildup in the tub or shower regularly. Attach bath mats securely with double-sided non-slip rug tape. Do not have throw rugs and other things on the floor that can make you trip. What can I do in the bedroom? Use night lights. Make sure that you have a light by your bed that is easy to reach. Do not use any sheets or blankets that are too big for your bed. They should not hang down onto the floor. Have a firm chair that has side arms. You can use this for support while you get dressed. Do not have throw rugs and other things on the floor that can make you trip. What can I do in the kitchen? Clean up any spills right away. Avoid walking on wet floors. Keep items that you use a lot in easy-to-reach places. If you need to reach something above you, use a strong step stool that has a grab bar. Keep  electrical cords out of the way. Do not use floor polish or wax that makes floors slippery. If you must use wax, use non-skid floor wax. Do not have throw rugs and other things on the floor that can make you trip. What can I do with my stairs? Do not leave any items on the stairs. Make sure that there are handrails on both sides of the stairs and use them. Fix handrails that are broken or loose. Make sure that handrails are as long as the stairways. Check any carpeting to make sure that it is firmly attached to the stairs. Fix any carpet that is loose or worn. Avoid having throw rugs at the top or bottom of the stairs. If you do have throw rugs, attach them to the floor with carpet tape. Make sure that you have a light switch at the top of the stairs and the bottom of the stairs. If you do not have them, ask someone to add them for you. What else can I do to help prevent falls? Wear shoes that: Do not have high heels. Have rubber bottoms. Are comfortable and fit you well. Are closed  at the toe. Do not wear sandals. If you use a stepladder: Make sure that it is fully opened. Do not climb a closed stepladder. Make sure that both sides of the stepladder are locked into place. Ask someone to hold it for you, if possible. Clearly mark and make sure that you can see: Any grab bars or handrails. First and last steps. Where the edge of each step is. Use tools that help you move around (mobility aids) if they are needed. These include: Canes. Walkers. Scooters. Crutches. Turn on the lights when you go into a dark area. Replace any light bulbs as soon as they burn out. Set up your furniture so you have a clear path. Avoid moving your furniture around. If any of your floors are uneven, fix them. If there are any pets around you, be aware of where they are. Review your medicines with your doctor. Some medicines can make you feel dizzy. This can increase your chance of falling. Ask your doctor what other things that you can do to help prevent falls. This information is not intended to replace advice given to you by your health care provider. Make sure you discuss any questions you have with your health care provider. Document Released: 03/22/2009 Document Revised: 11/01/2015 Document Reviewed: 06/30/2014 Elsevier Interactive Patient Education  2017 [employer].

## 2022-04-24 NOTE — Progress Notes (Addendum)
I connected with  Sabrina Ramirez on 04/24/22 by a audio enabled telemedicine application and verified that I am speaking with the correct person using two identifiers.  Patient Location: Home  Provider Location: Office/Clinic  I discussed the limitations of evaluation and management by telemedicine. The patient expressed understanding and agreed to proceed.   Subjective:   Sabrina Ramirez is a 81 y.o. female who presents for Medicare Annual (Subsequent) preventive examination.  Review of Systems     Cardiac Risk Factors include: advanced age (>26men, >37 women);hypertension     Objective:    Today's Vitals   04/24/22 1305  Weight: 174 lb (78.9 kg)   Body mass index is 29.87 kg/m.     04/24/2022    1:10 PM 01/15/2022   11:16 AM 03/29/2021   11:08 AM 03/11/2019    1:23 PM 01/26/2019    3:19 PM 02/01/2018   10:34 AM 10/28/2017   12:00 PM  Advanced Directives  Does Patient Have a Medical Advance Directive? Yes No Yes Yes No Yes Yes  Type of Paramedic of [place];Living will  Healthcare Power of Attorney Living will;Healthcare Power of [place];Living will [place];Living will  Does patient want to make changes to medical advance directive? No - Patient declined   No - Patient declined  No - Patient declined   Copy of [place] in Chart? Yes - validated most recent copy scanned in chart (See row information)  Yes - validated most recent copy scanned in chart (See row information) No - copy requested  No - copy requested No - copy requested  Would patient like information on creating a medical advance directive?  No - Patient declined   No - Patient declined No - Patient declined     Current Medications (verified) Outpatient Encounter Medications as of 04/24/2022  Medication Sig   Acetaminophen (TYLENOL PO) Take by mouth daily as needed.   Albuterol Sulfate (PROAIR RESPICLICK) 213 (90  Base) MCG/ACT AEPB Inhale 1-2 puffs into the lungs every 6 (six) hours as needed (for wheezing/shortness of breath).    aspirin EC 81 MG tablet Take 1 tablet (81 mg total) by mouth at bedtime.   ciclopirox (PENLAC) 8 % solution Apply topically at bedtime. Apply over nail and surrounding skin. Apply daily over previous coat. After seven (7) days, may remove with alcohol and continue cycle.   fluticasone furoate-vilanterol (BREO ELLIPTA) 200-25 MCG/INH AEPB Inhale 1 puff into the lungs daily.   metoprolol succinate (TOPROL-XL) 100 MG 24 hr tablet TAKE 1 TABLET BY MOUTH ONCE DAILY WITH A OR IMMEDIATELY FOLLOWING A MEAL   montelukast (SINGULAIR) 10 MG tablet Take 10 mg by mouth at bedtime.    Multiple Vitamin (MULTIVITAMIN) tablet Take 1 tablet by mouth daily. Senior Multivitamin   OVER THE COUNTER MEDICATION Systane Ultra Eyedrops   rosuvastatin (CRESTOR) 5 MG tablet TAKE 1/2 (ONE-HALF) TABLET BY MOUTH IN THE EVENING   triamcinolone cream (KENALOG) 0.1 % Apply 1 application topically 2 (two) times daily as needed (for skin rash (Summertime)).    [DISCONTINUED] predniSONE (DELTASONE) 20 MG tablet Take 2 pills for 3 days, 1 pill for 4 days   Facility-Administered Encounter Medications as of 04/24/2022  Medication   clotrimazole (LOTRIMIN) 1 % cream    Allergies (verified) Amlodipine, Flexeril [cyclobenzaprine], Chlor-trimeton [chlorpheniramine], Ace inhibitors, Adhesive [tape], Angiotensin receptor blockers, Corn-containing products, Iodine, and Triamterene-hctz   History: Past Medical History:  Diagnosis  Date   Allergic asthma, mild intermittent, uncomplicated 38/75/6433   Dr. Donneta Romberg, allergist   Arthritis    Chronic kidney disease    Stage III kidney disease   GERD (gastroesophageal reflux disease)    Gout flare 01/17/2022   Hemorrhoids    History of kidney stones    Hypertension    IBS (irritable bowel syndrome)    IFG (impaired fasting glucose) 03/09/2019   a1c 6.3 02/2019    Nephrolithiasis 03/22/2021   Right ureteral stone; [facility] 2022   Psoriasis    Past Surgical History:  Procedure Laterality Date   ABDOMINAL HYSTERECTOMY  1987   CATARACT EXTRACTION W/ INTRAOCULAR LENS  IMPLANT, BILATERAL Bilateral 2015   [place] SURGERY  07/10/2017   TONSILLECTOMY     Family History  Problem Relation Age of Onset   Stroke Mother    Diabetes Father    Heart attack Father    Heart disease Father    Hyperlipidemia Father    Hypertension Father    Stroke Father    Diabetes Sister    Hypertension Sister    Transient ischemic attack Sister    COPD Maternal Grandmother    Heart disease Maternal Grandmother    Deep vein thrombosis Maternal Grandfather    Stroke Maternal Grandfather    Deep vein thrombosis Paternal Grandmother    Hypertension Paternal Grandmother    Hypertension Daughter    Arthritis Maternal Aunt    COPD Maternal Aunt    Hypertension Maternal Aunt    Diabetes Paternal Uncle    Heart disease Paternal Uncle    Stroke Paternal Uncle    Breast cancer Neg Hx    Social History   Socioeconomic History   Marital status: Divorced    Spouse name: Not on file   Number of children: Not on file   Years of education: Not on file   Highest education level: Not on file  Occupational History   Occupation: Retired  Tobacco Use   Smoking status: Never   Smokeless tobacco: Never  Vaping Use   Vaping Use: Never used  Substance and Sexual Activity   Alcohol use: Yes    Comment: occassionally   Drug use: No   Sexual activity: Never  Other Topics Concern   Not on file  Social History Narrative   Not on file   Social Determinants of Health   Financial Resource Strain: Low Risk  (04/24/2022)   Overall Financial Resource Strain (CARDIA)    Difficulty of Paying Living Expenses: Not hard at all  Food Insecurity: No Food Insecurity (04/24/2022)   Hunger Vital Sign    Worried About Estate manager/land agent of  Food in the Last Year: Never true    Ran Out of Food in the Last Year: Never true  Transportation Needs: No Transportation Needs (04/24/2022)   PRAPARE - Hydrologist (Medical): No    Lack of Transportation (Non-Medical): No  Physical Activity: Inactive (04/24/2022)   Exercise Vital Sign    Days of Exercise per Week: 0 days    Minutes of Exercise per Session: 0 min  Stress: No Stress Concern Present (04/24/2022)   [place]    Feeling of Stress : Not at all  Social Connections: Moderately Isolated (04/24/2022)   Social Connection and Isolation Panel [NHANES]    Frequency of Communication with Friends and Family: More than  three times a week    Frequency of Social Gatherings with Friends and Family: Once a week    Attends Religious Services: More than 4 times per year    Active Member of [employer] or Organizations: No    Attends Music therapist: Never    Marital Status: Divorced    Tobacco Counseling Counseling given: Not Answered   Clinical Intake:  Pre-visit preparation completed: Yes  Pain : No/denies pain     BMI - recorded: 29.87 Nutritional Status: BMI 25 -29 Overweight Nutritional Risks: None Diabetes: No  How often do you need to have someone help you when you read instructions, pamphlets, or other written materials from your doctor or pharmacy?: 1 - Never  Diabetic?no  Interpreter Needed?: No  Information entered by :: Charlott Rakes, LPN   Activities of Daily Living    04/24/2022    1:11 PM  In your present state of health, do you have any difficulty performing the following activities:  Hearing? 1  Vision? 1  Difficulty concentrating or making decisions? 1  Walking or climbing stairs? 1  Dressing or bathing? 1  Doing errands, shopping? 1  Preparing Food and eating ? Y  Using the Toilet? Y  In the past six months, have you accidently leaked  urine? Y  Do you have problems with loss of bowel control? Y  Managing your Medications? Y  Managing your Finances? Y  Housekeeping or managing your Housekeeping? Y    Patient Care Team: Leamon Arnt, MD as PCP - General (Family Medicine) Mosetta Anis, MD as Referring Physician (Allergy) Consuella Lose, MD as Consulting Physician (Neurosurgery) Mottinger, Sharyn Lull (Dentistry) Warren Danes, PA-C as Physician Assistant (Dermatology) Reesa Chew, MD as Consulting Physician (Nephrology)  Indicate any recent Medical Services you may have received from other than [facility] providers in the past year (date may be approximate).     Assessment:   This is a routine wellness examination for [place].  Hearing/Vision screen Hearing Screening - Comments:: Pt stated hearing loss  Vision Screening - Comments:: Pt follows up with Dr Lady Gary ophthalmology   Dietary issues and exercise activities discussed: Current Exercise Habits: The patient does not participate in regular exercise at present   Goals Addressed             This Visit's Progress    Patient Stated       Stay healthy and active        Depression Screen    04/24/2022    1:11 PM 04/24/2022    1:08 PM 12/25/2021   10:32 AM 03/29/2021   11:08 AM 09/19/2020   10:45 AM 03/23/2020   10:59 AM 03/08/2019   11:13 AM  PHQ 2/9 Scores  PHQ - 2 Score 0 0 0 0 0 1 2  PHQ- 9 Score       6    Fall Risk    04/24/2022    1:11 PM 12/25/2021   10:32 AM 03/29/2021   11:10 AM 03/23/2020   11:05 AM 09/21/2019    2:08 PM  Fall Risk   Falls in the past year? 0 0 0 0 0  Number falls in past yr: 0 0 0 0 0  Injury with Fall? 0 0 0 0 0  Risk for fall due to : Impaired balance/gait;Impaired vision No Fall Risks Impaired vision;Impaired balance/gait;Impaired mobility Impaired vision;Impaired mobility;Impaired balance/gait   Follow up Falls prevention discussed Falls evaluation completed Falls prevention discussed Falls  prevention  discussed     FALL RISK PREVENTION PERTAINING TO THE HOME:  Any stairs in or around the home? Yes  If so, are there any without handrails? No  Home free of loose throw rugs in walkways, pet beds, electrical cords, etc? Yes  Adequate lighting in your home to reduce risk of falls? Yes   ASSISTIVE DEVICES UTILIZED TO PREVENT FALLS:  Life alert? No  Use of a cane, walker or w/c? Yes  Grab bars in the bathroom? No  Shower chair or bench in shower? Yes  Elevated toilet seat or a handicapped toilet? Yes   TIMED UP AND GO:  Was the test performed? No .   Cognitive Function:        03/29/2021   11:11 AM 03/23/2020   11:09 AM  6CIT Screen  What Year? 0 points 0 points  What month? 0 points 0 points  What time? 0 points   Count back from 20 0 points 0 points  Months in reverse 0 points 0 points  Repeat phrase 0 points 0 points  Total Score 0 points     Immunizations Immunization History  Administered Date(s) Administered   Fluad Quad(high Dose 65+) 03/06/2016, 04/09/2017, 03/08/2019, 03/27/2020, 03/22/2021, 03/10/2022   Influenza, High Dose Seasonal PF 03/02/2012, 04/06/2013, 04/21/2014, 04/16/2015, 03/06/2016, 04/09/2017, 03/01/2018   Influenza-Unspecified 04/12/2011   PFIZER(Purple Top)SARS-COV-2 Vaccination 07/02/2019, 07/23/2019, 09/16/2019, 03/06/2020, 03/20/2021   Pfizer Covid-19 Vaccine Bivalent Booster 52yrs & up 03/20/2021   Pneumococcal Conjugate-13 10/05/2014   Pneumococcal Polysaccharide-23 04/23/2010, 11/12/2015, 07/01/2016, 07/13/2017, 01/11/2018, 09/09/2021   Pneumococcal-Unspecified 04/23/2010   Tdap 05/23/2010, 01/24/2022   Zoster Recombinat (Shingrix) 11/18/2017, 03/23/2018   Zoster, Live 06/09/2004    TDAP status: Up to date  Flu Vaccine status: Up to date  Pneumococcal vaccine status: Up to date  Covid-19 vaccine status: Completed vaccines  Qualifies for Shingles Vaccine? Yes   Zostavax completed Yes   Shingrix Completed?:  Yes  Screening Tests Health Maintenance  Topic Date Due   COVID-19 Vaccine (6 - Pfizer risk series) 05/28/2022 (Originally 05/15/2021)   MAMMOGRAM  03/27/2023   Medicare Annual Wellness (AWV)  04/25/2023   DEXA SCAN  04/28/2023   TETANUS/TDAP  01/25/2032   Pneumonia Vaccine 42+ Years old  Completed   INFLUENZA VACCINE  Completed   Zoster Vaccines- Shingrix  Completed   HPV VACCINES  Aged Out    Health Maintenance  There are no preventive care reminders to display for this patient.   Colorectal cancer screening: No longer required.   Mammogram status: Completed 03/26/22. Repeat every year  Bone Density status: Completed 04/28/19. Results reflect: Bone density results: NORMAL. Repeat every 5 years.   Additional Screening:   Vision Screening: Recommended annual ophthalmology exams for early detection of glaucoma and other disorders of the eye. Is the patient up to date with their annual eye exam?  Yes  Who is the provider or what is the name of the office in which the patient attends annual eye exams? [facility] opthalmology If pt is not established with a provider, would they like to be referred to a provider to establish care? No .   Dental Screening: Recommended annual dental exams for proper oral hygiene  Community Resource Referral / Chronic Care Management: CRR required this visit?  No   CCM required this visit?  No      Plan:     I have personally reviewed and noted the following in the patient's chart:   Medical and social history Use of  alcohol, tobacco or illicit drugs  Current medications and supplements including opioid prescriptions. Patient is not currently taking opioid prescriptions. Functional ability and status Nutritional status Physical activity Advanced directives List of other physicians Hospitalizations, surgeries, and ER visits in previous 12 months Vitals Screenings to include cognitive, depression, and falls Referrals and  appointments  In addition, I have reviewed and discussed with patient certain preventive protocols, quality metrics, and best practice recommendations. A written personalized care plan for preventive services as well as general preventive health recommendations were provided to patient.     Willette Brace, LPN   73/71/0626   Nurse Notes: pt has complaints of hearing heart beat during sleep along with swooshing sound, pt stated no pain at this time

## 2022-05-12 ENCOUNTER — Encounter: Payer: Self-pay | Admitting: Family Medicine

## 2022-05-12 ENCOUNTER — Ambulatory Visit: Payer: Medicare Other | Admitting: Family Medicine

## 2022-05-12 VITALS — BP 120/50 | HR 58 | Temp 97.8°F | Ht 64.0 in | Wt 176.0 lb

## 2022-05-12 DIAGNOSIS — I10 Essential (primary) hypertension: Secondary | ICD-10-CM

## 2022-05-12 DIAGNOSIS — N184 Chronic kidney disease, stage 4 (severe): Secondary | ICD-10-CM | POA: Diagnosis not present

## 2022-05-12 DIAGNOSIS — H938X2 Other specified disorders of left ear: Secondary | ICD-10-CM | POA: Diagnosis not present

## 2022-05-12 DIAGNOSIS — M1030 Gout due to renal impairment, unspecified site: Secondary | ICD-10-CM | POA: Insufficient documentation

## 2022-05-12 DIAGNOSIS — K219 Gastro-esophageal reflux disease without esophagitis: Secondary | ICD-10-CM

## 2022-05-12 NOTE — Progress Notes (Signed)
Subjective  CC:  Chief Complaint  Patient presents with   Concerns     concerns of hearing her heartbeat and sounds of swooshing during beats    HPI: Sabrina Ramirez is a 81 y.o. female who presents to the office today to address the problems listed above in the chief complaint. C/o hearing heartbeat in left ear when lies on that side at night to sleep intermittently over last 1-2 months. No pain. No cp or palpitations. Has HTN but BP runs on the low side. No cold sxs but has allergies. Has hearing loss and needs hearing aides but has not yet decided to move forward with that.  Reviewed renal notes. CKD4, possibly related to htn but uncertain. Stable currently GERD is now improved again on omeprazole.  C/o thin nails that are breaking. New problem. No other skin condition noted.   Assessment  1. Audible heartbeat in left ear   2. Chronic kidney disease, stage 4 (severe) (HCC)   3. Benign essential hypertension   4. Gastroesophageal reflux disease, unspecified whether esophagitis present      Plan  Audible heartbeat in ear:  reassured. May be related to hearing loss. Doubt elevated bps. Pt will monitor bp at home.  Ckd: follow along with renal On allopurinol for gout now [id number] at home on metoprolol 100 daily. May decrease dose if running low at home. No sxs of low blood pressure now.  Gerd maintained on omeprazole 20 daily.   Follow up: [facility]  06/27/2022  No orders of the defined types were placed in this encounter.  No orders of the defined types were placed in this encounter.     I reviewed the patients updated PMH, FH, and SocHx.    Patient Active Problem List   Diagnosis Date Noted   Anemia associated with stage 4 chronic renal failure ([place]) 09/25/2021    Priority: High   Chronic kidney disease, stage 4 (severe) ([place]) 09/24/2021    Priority: High   IFG (impaired fasting glucose) 03/09/2019    Priority: High   Obesity, Class I, BMI 30-34.9 04/16/2015     Priority: High   Hypertriglyceridemia 09/22/2011    Priority: High   Benign essential hypertension 05/23/2010    Priority: High   Gout due to renal impairment 05/12/2022    Priority: Medium    Nephrolithiasis 03/22/2021    Priority: Medium    Localized primary osteoarthritis of carpometacarpal joint of left thumb 03/08/2019    Priority: Medium    On statin therapy due to risk of future cardiovascular event 12/01/2017    Priority: Medium    Spondylolisthesis at L5-S1 level 08/13/2017    Priority: Medium    Colon polyp 05/25/2017    Priority: Medium    Ophthalmic migraine 05/25/2017    Priority: Medium    Essential tremor 05/25/2017    Priority: Medium    Allergic asthma, mild persistent, uncomplicated 19/41/7408    Priority: Medium    Irritable bowel syndrome with diarrhea 07/08/2010    Priority: Medium    Chronic allergic rhinitis 02/25/2010    Priority: Medium    GERD (gastroesophageal reflux disease) 02/25/2010    Priority: Medium    Osteoarthritis, multiple sites 06/12/2009    Priority: Medium    Allergic rhinitis due to pollen 01/24/2022    Priority: Low   Conjunctivitis, allergic, chronic 10/01/2012    Priority: Low   Rosacea 06/25/2011    Priority: Low   Psoriasis 02/25/2010    Priority:  Low   Current Meds  Medication Sig   Acetaminophen (TYLENOL PO) Take by mouth daily as needed.   Albuterol Sulfate (PROAIR RESPICLICK) 706 (90 Base) MCG/ACT AEPB Inhale 1-2 puffs into the lungs every 6 (six) hours as needed (for wheezing/shortness of breath).    allopurinol (ZYLOPRIM) 100 MG tablet Take 50 mg by mouth daily.   Alum Hydroxide-Mag Trisilicate (GAVISCON) 23-76.2 MG CHEW Chew by mouth.   aspirin EC 81 MG tablet Take 1 tablet (81 mg total) by mouth at bedtime.   azelastine (ASTELIN) 0.1 % nasal spray Place into both nostrils 2 (two) times daily. Use in each nostril as directed   ciclopirox (PENLAC) 8 % solution Apply topically at bedtime. Apply over nail and  surrounding skin. Apply daily over previous coat. After seven (7) days, may remove with alcohol and continue cycle.   fluocinonide cream (LIDEX) 8.31 % Apply 1 Application topically 2 (two) times daily.   fluticasone furoate-vilanterol (BREO ELLIPTA) 200-25 MCG/INH AEPB Inhale 1 puff into the lungs daily.   Glucosamine-Chondroit-Vit C-Mn (GLUCOSAMINE 1500 COMPLEX) CAPS Take by mouth.   loperamide (IMODIUM A-D) 2 MG tablet Take 2 mg by mouth 4 (four) times daily as needed for diarrhea or loose stools.   metoprolol succinate (TOPROL-XL) 100 MG 24 hr tablet TAKE 1 TABLET BY MOUTH ONCE DAILY WITH A OR IMMEDIATELY FOLLOWING A MEAL   montelukast (SINGULAIR) 10 MG tablet Take 10 mg by mouth at bedtime.    Multiple Vitamin (MULTIVITAMIN) tablet Take 1 tablet by mouth daily. Senior Multivitamin   nystatin powder Apply 1 Application topically 3 (three) times daily.   omeprazole (PRILOSEC OTC) 20 MG tablet Take 20 mg by mouth daily.   OVER THE COUNTER MEDICATION Systane Ultra Eyedrops   rosuvastatin (CRESTOR) 5 MG tablet TAKE 1/2 (ONE-HALF) TABLET BY MOUTH IN THE EVENING   triamcinolone cream (KENALOG) 0.1 % Apply 1 application topically 2 (two) times daily as needed (for skin rash (Summertime)).    Current Facility-Administered Medications for the 05/12/22 encounter (Office Visit) with Leamon Arnt, MD  Medication   clotrimazole (LOTRIMIN) 1 % cream    Allergies: Patient is allergic to amlodipine, flexeril [cyclobenzaprine], chlor-trimeton [chlorpheniramine], ace inhibitors, adhesive [tape], angiotensin receptor blockers, corn-containing products, iodine, and triamterene-hctz. Family History: Patient family history includes Arthritis in her maternal aunt; COPD in her maternal aunt and maternal grandmother; Deep vein thrombosis in her maternal grandfather and paternal grandmother; Diabetes in her father, paternal uncle, and sister; Heart attack in her father; Heart disease in her father, maternal  grandmother, and paternal uncle; Hyperlipidemia in her father; Hypertension in her daughter, father, maternal aunt, paternal grandmother, and sister; Stroke in her father, maternal grandfather, mother, and paternal uncle; Transient ischemic attack in her sister. Social History:  Patient  reports that she has never smoked. She has never used smokeless tobacco. She reports current alcohol use. She reports that she does not use drugs.  Review of Systems: Constitutional: Negative for fever malaise or anorexia Cardiovascular: negative for chest pain Respiratory: negative for SOB or persistent cough Gastrointestinal: negative for abdominal pain  Objective  Vitals: BP (!) 120/50   Pulse (!) 58   Temp 97.8 F (36.6 C)   Ht 5\' 4"  (1.626 m)   Wt 176 lb (79.8 kg)   SpO2 96%   BMI 30.21 kg/m  General: no acute distress , A&Ox3 HEENT: PEERL, conjunctiva normal, neck is supple, TM clear bilaterally, nl carotid pulses Cardiovascular:  RRR without murmur or gallop.  Respiratory:  Good breath sounds bilaterally, CTAB with normal respiratory effort Skin:  Warm, no rashes    Commons side effects, risks, benefits, and alternatives for medications and treatment plan prescribed today were discussed, and the patient expressed understanding of the given instructions. Patient is instructed to call or message via MyChart if he/she has any questions or concerns regarding our treatment plan. No barriers to understanding were identified. We discussed Red Flag symptoms and signs in detail. Patient expressed understanding regarding what to do in case of urgent or emergency type symptoms.  Medication list was reconciled, printed and provided to the patient in AVS. Patient instructions and summary information was reviewed with the patient as documented in the AVS. This note was prepared with assistance of Dragon voice recognition software. Occasional wrong-word or sound-a-like substitutions may have occurred due to the  inherent limitations of voice recognition software  This visit occurred during the SARS-CoV-2 public health emergency.  Safety protocols were in place, including screening questions prior to the visit, additional usage of staff PPE, and extensive cleaning of exam room while observing appropriate contact time as indicated for disinfecting solutions.

## 2022-05-12 NOTE — Patient Instructions (Signed)
Please follow up as scheduled for your next visit with me: 06/27/2022   If you have any questions or concerns, please don't hesitate to send me a message via MyChart or call the office at [phone]. Thank you for visiting with [place] today! It's our pleasure caring for you.

## 2022-06-08 ENCOUNTER — Other Ambulatory Visit: Payer: Self-pay | Admitting: Family Medicine

## 2022-06-10 ENCOUNTER — Ambulatory Visit: Payer: Medicare Other | Admitting: Family Medicine

## 2022-06-11 ENCOUNTER — Ambulatory Visit: Payer: Medicare Other | Admitting: Family Medicine

## 2022-06-11 ENCOUNTER — Ambulatory Visit: Payer: Medicare Other | Admitting: Internal Medicine

## 2022-06-11 ENCOUNTER — Encounter: Payer: Self-pay | Admitting: Family Medicine

## 2022-06-11 VITALS — BP 130/60 | HR 95 | Temp 97.8°F | Ht 64.0 in | Wt 175.8 lb

## 2022-06-11 DIAGNOSIS — J208 Acute bronchitis due to other specified organisms: Secondary | ICD-10-CM | POA: Diagnosis not present

## 2022-06-11 DIAGNOSIS — B9689 Other specified bacterial agents as the cause of diseases classified elsewhere: Secondary | ICD-10-CM

## 2022-06-11 DIAGNOSIS — J029 Acute pharyngitis, unspecified: Secondary | ICD-10-CM | POA: Diagnosis not present

## 2022-06-11 LAB — POC COVID19 BINAXNOW: SARS Coronavirus 2 Ag: NEGATIVE

## 2022-06-11 MED ORDER — GUAIFENESIN-CODEINE 100-10 MG/5ML PO SOLN: 5.0000 mL | Freq: Four times a day (QID) | ORAL | 0 refills | Status: DC | PRN

## 2022-06-11 MED ORDER — AZITHROMYCIN 250 MG PO TABS: ORAL_TABLET | ORAL | 0 refills | Status: DC

## 2022-06-11 NOTE — Progress Notes (Signed)
Subjective  CC:  Chief Complaint  Patient presents with   Sore Throat    Pt stated that she has been sick since 06/06/2022. Symptoms include sore throat, cough, mucous and fever(yesterday)    HPI: SUBJECTIVE:  Sabrina Ramirez is a 82 y.o. female who complains of congestion, nasal blockage, post nasal drip, cough described as harsh, paroxysmal, and productive and denies sinus, high fevers (running low grade 100 for 3 days), SOB, chest pain or significant GI symptoms. Symptoms have been present for 3-4 days. She denies a history of anorexia, dizziness, vomiting and wheezing. She denies a history of asthma or COPD. Patient does not smoke cigarettes.  Assessment  1. Acute bacterial bronchitis   2. Sore throat      Plan  Discussion:  Treat for bacterial bronchitis due to prolonged course and worsening symptoms. Education regarding differences between viral and bacterial infections and treatment options are discussed.  Supportive care measures are recommended.  We discussed the use of mucolytic's, decongestants, antihistamines and antitussives as needed.  Tylenol or Advil are recommended if needed.  Follow up: prn   Orders Placed This Encounter  Procedures   POC COVID-19   Meds ordered this encounter  Medications   azithromycin (ZITHROMAX) 250 MG tablet    Sig: Take 2 tabs today, then 1 tab daily for 4 days    Dispense:  1 each    Refill:  0   guaiFENesin-codeine 100-10 MG/5ML syrup    Sig: Take 5 mLs by mouth every 6 (six) hours as needed for cough.    Dispense:  120 mL    Refill:  0      I reviewed the patients updated PMH, FH, and SocHx.  Social History: Patient  reports that she has never smoked. She has never used smokeless tobacco. She reports current alcohol use. She reports that she does not use drugs.  Patient Active Problem List   Diagnosis Date Noted   Anemia associated with stage 4 chronic renal failure ([place]) 09/25/2021    Priority: High   Chronic kidney  disease, stage 4 (severe) ([place]) 09/24/2021    Priority: High   IFG (impaired fasting glucose) 03/09/2019    Priority: High   Obesity, Class I, BMI 30-34.9 04/16/2015    Priority: High   Hypertriglyceridemia 09/22/2011    Priority: High   Benign essential hypertension 05/23/2010    Priority: High   Gout due to renal impairment 05/12/2022    Priority: Medium    Nephrolithiasis 03/22/2021    Priority: Medium    Localized primary osteoarthritis of carpometacarpal joint of left thumb 03/08/2019    Priority: Medium    On statin therapy due to risk of future cardiovascular event 12/01/2017    Priority: Medium    Spondylolisthesis at L5-S1 level 08/13/2017    Priority: Medium    Colon polyp 05/25/2017    Priority: Medium    Ophthalmic migraine 05/25/2017    Priority: Medium    Essential tremor 05/25/2017    Priority: Medium    Allergic asthma, mild persistent, uncomplicated 58/02/9832    Priority: Medium    Irritable bowel syndrome with diarrhea 07/08/2010    Priority: Medium    Chronic allergic rhinitis 02/25/2010    Priority: Medium    GERD (gastroesophageal reflux disease) 02/25/2010    Priority: Medium    Osteoarthritis, multiple sites 06/12/2009    Priority: Medium    Allergic rhinitis due to pollen 01/24/2022    Priority: Low  Conjunctivitis, allergic, chronic 10/01/2012    Priority: Low   Rosacea 06/25/2011    Priority: Low   Psoriasis 02/25/2010    Priority: Low    Review of Systems: Cardiovascular: negative for chest pain Respiratory: negative for SOB or hemoptysis Gastrointestinal: negative for abdominal pain Genitourinary: negative for dysuria or gross hematuria Current Meds  Medication Sig   Acetaminophen (TYLENOL PO) Take by mouth daily as needed.   Albuterol Sulfate (PROAIR RESPICLICK) 237 (90 Base) MCG/ACT AEPB Inhale 1-2 puffs into the lungs every 6 (six) hours as needed (for wheezing/shortness of breath).    allopurinol (ZYLOPRIM) 100 MG tablet Take 50  mg by mouth daily.   Alum Hydroxide-Mag Trisilicate (GAVISCON) 62-83.1 MG CHEW Chew by mouth.   aspirin EC 81 MG tablet Take 1 tablet (81 mg total) by mouth at bedtime.   azelastine (ASTELIN) 0.1 % nasal spray Place into both nostrils 2 (two) times daily. Use in each nostril as directed   azithromycin (ZITHROMAX) 250 MG tablet Take 2 tabs today, then 1 tab daily for 4 days   ciclopirox (PENLAC) 8 % solution Apply topically at bedtime. Apply over nail and surrounding skin. Apply daily over previous coat. After seven (7) days, may remove with alcohol and continue cycle.   fluocinonide cream (LIDEX) 5.17 % Apply 1 Application topically 2 (two) times daily.   fluticasone furoate-vilanterol (BREO ELLIPTA) 200-25 MCG/INH AEPB Inhale 1 puff into the lungs daily.   Glucosamine-Chondroit-Vit C-Mn (GLUCOSAMINE 1500 COMPLEX) CAPS Take by mouth.   guaiFENesin-codeine 100-10 MG/5ML syrup Take 5 mLs by mouth every 6 (six) hours as needed for cough.   loperamide (IMODIUM A-D) 2 MG tablet Take 2 mg by mouth 4 (four) times daily as needed for diarrhea or loose stools.   metoprolol succinate (TOPROL-XL) 100 MG 24 hr tablet TAKE 1 TABLET BY MOUTH ONCE DAILY WITH A OR IMMEDIATELY FOLLOWING A MEAL   montelukast (SINGULAIR) 10 MG tablet Take 10 mg by mouth at bedtime.    Multiple Vitamin (MULTIVITAMIN) tablet Take 1 tablet by mouth daily. Senior Multivitamin   nystatin powder Apply 1 Application topically 3 (three) times daily.   omeprazole (PRILOSEC OTC) 20 MG tablet Take 20 mg by mouth daily.   OVER THE COUNTER MEDICATION Systane Ultra Eyedrops   rosuvastatin (CRESTOR) 5 MG tablet TAKE 1/2 (ONE-HALF) TABLET BY MOUTH IN THE EVENING   triamcinolone cream (KENALOG) 0.1 % Apply 1 application topically 2 (two) times daily as needed (for skin rash (Summertime)).    Current Facility-Administered Medications for the 06/11/22 encounter (Office Visit) with Leamon Arnt, MD  Medication   clotrimazole (LOTRIMIN) 1 % cream     Objective  Vitals: BP 130/60   Pulse 95   Temp 97.8 F (36.6 C)   Ht 5\' 4"  (1.626 m)   Wt 175 lb 12.8 oz (79.7 kg)   SpO2 96%   BMI 30.18 kg/m  General: no acute distress  Psych:  Alert and oriented, normal mood and affect HEENT:  Normocephalic, atraumatic, supple neck, moist mucous membranes, mildly erythematous pharynx without exudate, mild lymphadenopathy, supple neck Cardiovascular:  RRR without murmur. no edema Respiratory:  Good breath sounds bilaterally, CTAB with normal respiratory effort with occasional rhonchi  Negative covid test in office today    Commons side effects, risks, benefits, and alternatives for medications and treatment plan prescribed today were discussed, and the patient expressed understanding of the given instructions. Patient is instructed to call or message via MyChart if he/she has any questions  or concerns regarding our treatment plan. No barriers to understanding were identified. We discussed Red Flag symptoms and signs in detail. Patient expressed understanding regarding what to do in case of urgent or emergency type symptoms.  Medication list was reconciled, printed and provided to the patient in AVS. Patient instructions and summary information was reviewed with the patient as documented in the AVS. This note was prepared with assistance of Dragon voice recognition software. Occasional wrong-word or sound-a-like substitutions may have occurred due to the inherent limitations of voice recognition software

## 2022-06-11 NOTE — Patient Instructions (Signed)
Please follow up as scheduled for your next visit with me: 06/27/2022   If you have any questions or concerns, please don't hesitate to send me a message via MyChart or call the office at [phone]. Thank you for visiting with Sabrina Ramirez today! It's our pleasure caring for you.   Take mucinex DM twice a [place] along with the prescription medications.

## 2022-06-17 ENCOUNTER — Telehealth: Payer: Self-pay | Admitting: Family Medicine

## 2022-06-17 NOTE — Telephone Encounter (Signed)
Caller states:  - Patient has completed antibiotics following OV with PCP on 06/11/22.  - Cough had improved but following medication completion, cough has returned and worsened   Caller requests:  - PCP recommendation on if patient needs to be seen again or if a stronger medication needs to be sent in   Patient has been scheduled for 06/18/22 @ 11am just in case. Please Advise.

## 2022-06-18 ENCOUNTER — Ambulatory Visit: Payer: Medicare Other | Admitting: Family

## 2022-06-18 ENCOUNTER — Encounter: Payer: Self-pay | Admitting: Family

## 2022-06-18 VITALS — BP 150/78 | HR 51 | Temp 97.8°F | Ht 64.0 in | Wt 174.2 lb

## 2022-06-18 DIAGNOSIS — B9689 Other specified bacterial agents as the cause of diseases classified elsewhere: Secondary | ICD-10-CM | POA: Diagnosis not present

## 2022-06-18 DIAGNOSIS — R001 Bradycardia, unspecified: Secondary | ICD-10-CM | POA: Diagnosis not present

## 2022-06-18 DIAGNOSIS — J208 Acute bronchitis due to other specified organisms: Secondary | ICD-10-CM

## 2022-06-18 MED ORDER — METHYLPREDNISOLONE ACETATE 40 MG/ML IJ SUSP
60.0000 mg | Freq: Once | INTRAMUSCULAR | Status: AC
  Administered 2022-06-18: 60 mg via INTRAMUSCULAR

## 2022-06-18 NOTE — Progress Notes (Signed)
Patient ID: Sabrina Ramirez, female    DOB: 28-Mar-1941, 82 y.o.   MRN: [id number]  Chief Complaint  Patient presents with   Cough   Pre-visit Screening Tool Documentation    sx for about 10d    HPI:      Sinus sx w/persistent cough:   Pt c/o non-productive cough, and facial pressure, fatigue, & Nausea (she thinks from taking codeine cough syrup this am) diagnosed with bronchitis. Finished antibiotics (Zpack) on 1/7, which did seem to help to help her sx overall. Son is with pt and he heard her have a long coughing fit a few days ago after she finished the abt and was concerned her bronchitis had worsened.  Also taking Mucinex-DM bid, using saline nasal spray.   Assessment & Plan:  1. Acute bacterial bronchitis - given steroid injection in office. Advised to continue Mucinex during [place], but only use cough syrup with codeine at bedtime. Continue nasal saline spray tid, Astelin nasal spray, humidifier overnight, increase fluids to 2L of water qd. Advised pt & son that it can take several weeks for bronchitis to completely resolve. Call back if sx are still not better over next 4-5d.  - methylPREDNISolone acetate (DEPO-MEDROL) injection 60 mg  2. Symptomatic bradycardia - reports fatigue, no palpitations. Has had low HR in past also, denies continued fatigue prior to current illness.  pt is recovering from bronchitis & finished abt. Pt has pulse ox at home, reviewed normal readings with pt & son and to let PCP know if seeing continued low readings and/or fatigue continues after recovering from current illness. Reports she has a f/u in about 2 weeks.   Subjective:    Outpatient Medications Prior to Visit  Medication Sig Dispense Refill   Acetaminophen (TYLENOL PO) Take by mouth daily as needed.     Albuterol Sulfate (PROAIR RESPICLICK) 427 (90 Base) MCG/ACT AEPB Inhale 1-2 puffs into the lungs every 6 (six) hours as needed (for wheezing/shortness of breath).      allopurinol (ZYLOPRIM) 100  MG tablet Take 50 mg by mouth daily.     Alum Hydroxide-Mag Trisilicate (GAVISCON) 06-23.7 MG CHEW Chew by mouth.     aspirin EC 81 MG tablet Take 1 tablet (81 mg total) by mouth at bedtime.     azelastine (ASTELIN) 0.1 % nasal spray Place into both nostrils 2 (two) times daily. Use in each nostril as directed     ciclopirox (PENLAC) 8 % solution Apply topically at bedtime. Apply over nail and surrounding skin. Apply daily over previous coat. After seven (7) days, may remove with alcohol and continue cycle. 6.6 mL 1   fluocinonide cream (LIDEX) 6.28 % Apply 1 Application topically 2 (two) times daily.     fluticasone furoate-vilanterol (BREO ELLIPTA) 200-25 MCG/INH AEPB Inhale 1 puff into the lungs daily.     Glucosamine-Chondroit-Vit C-Mn (GLUCOSAMINE 1500 COMPLEX) CAPS Take by mouth.     guaiFENesin-codeine 100-10 MG/5ML syrup Take 5 mLs by mouth every 6 (six) hours as needed for cough. 120 mL 0   loperamide (IMODIUM A-D) 2 MG tablet Take 2 mg by mouth 4 (four) times daily as needed for diarrhea or loose stools.     metoprolol succinate (TOPROL-XL) 100 MG 24 hr tablet TAKE 1 TABLET BY MOUTH ONCE DAILY WITH A OR IMMEDIATELY FOLLOWING A MEAL 90 tablet 0   montelukast (SINGULAIR) 10 MG tablet Take 10 mg by mouth at bedtime.      Multiple Vitamin (MULTIVITAMIN) tablet Take  1 tablet by mouth daily. Senior Multivitamin     nystatin powder Apply 1 Application topically 3 (three) times daily.     omeprazole (PRILOSEC OTC) 20 MG tablet Take 20 mg by mouth daily.     OVER THE COUNTER MEDICATION Systane Ultra Eyedrops     rosuvastatin (CRESTOR) 5 MG tablet TAKE 1/2 (ONE-HALF) TABLET BY MOUTH IN THE EVENING 45 tablet 0   triamcinolone cream (KENALOG) 0.1 % Apply 1 application topically 2 (two) times daily as needed (for skin rash (Summertime)).      azithromycin (ZITHROMAX) 250 MG tablet Take 2 tabs today, then 1 tab daily for 4 days (Patient not taking: Reported on 06/18/2022) 1 each 0    Facility-Administered Medications Prior to Visit  Medication Dose Route Frequency Provider Last Rate Last Admin   clotrimazole (LOTRIMIN) 1 % cream   Topical BID Loralee Pacas, MD       Past Medical History:  Diagnosis Date   Allergic asthma, mild intermittent, uncomplicated 67/67/2094   Dr. Donneta Romberg, allergist   Arthritis    Chronic kidney disease    Stage III kidney disease   GERD (gastroesophageal reflux disease)    Gout flare 01/17/2022   Hemorrhoids    History of kidney stones    Hypertension    IBS (irritable bowel syndrome)    IFG (impaired fasting glucose) 03/09/2019   a1c 6.3 02/2019   Nephrolithiasis 03/22/2021   Right ureteral stone; [facility] 2022   Psoriasis    Past Surgical History:  Procedure Laterality Date   ABDOMINAL HYSTERECTOMY  1987   CATARACT EXTRACTION W/ INTRAOCULAR LENS  IMPLANT, BILATERAL Bilateral 2015   CHOLECYSTECTOMY     HEMORRHOID SURGERY     SPINE SURGERY  07/10/2017   TONSILLECTOMY     Allergies  Allergen Reactions   Amlodipine Swelling   Flexeril [Cyclobenzaprine] Other (See Comments)    Hallucinations   Chlor-Trimeton [Chlorpheniramine] Other (See Comments)   Ace Inhibitors Cough   Adhesive [Tape] Rash    PAPER TAPE OK   Angiotensin Receptor Blockers Cough   Corn-Containing Products Other (See Comments)    PLEASE BE ADVISED HIGH ALERT FOR GLUCOSE DRIPS/POPSICLES/JELLO FOR SWEETENERS WITH CORN   HEADACHES/RUNNY NOSE/GI UPSET/LARGE INTESTINE GROWTH/STOMACH CRAMPING     Iodine Rash   Triamterene-Hctz Cough      Objective:    Physical Exam Vitals and nursing note reviewed.  Constitutional:      Appearance: Normal appearance.  Cardiovascular:     Rate and Rhythm: Normal rate and regular rhythm.  Pulmonary:     Effort: Pulmonary effort is normal.     Breath sounds: Normal breath sounds.  Musculoskeletal:        General: Normal range of motion.  Skin:    General: Skin is warm and dry.  Neurological:     Mental  Status: She is alert.  Psychiatric:        Mood and Affect: Mood normal.        Behavior: Behavior normal.    BP (!) 150/78   Pulse (!) 51   Temp 97.8 F (36.6 C) (Temporal)   Ht 5\' 4"  (1.626 m)   Wt 174 lb 4 oz (79 kg)   SpO2 98%   BMI 29.91 kg/m  Wt Readings from Last 3 Encounters:  06/18/22 174 lb 4 oz (79 kg)  06/11/22 175 lb 12.8 oz (79.7 kg)  05/12/22 176 lb (79.8 kg)       Jeanie Sewer, NP

## 2022-06-18 NOTE — Patient Instructions (Signed)
It was very nice to see you today!   We gave you a steroid injection today to help you get over this infection. OK to continue the Mucinex-DM during the [place]. Take the cough syrup with codeine though only at night and only until cough is resolved or bottle is empty. Be sure to drink plenty of water/fluids during the [place]. Use a humidifier overnight to help provide moisture when you breathe, help your cough symptoms. Can use a nasal saline spray several times per [place] to moisturized and disinfect your sinuses.  Your BP is a little high today, most likely due to not feeling well, coughing. Drinking water daily helps flush sodium out of your body and also eat a low sodium diet to keep your BP from going higher.  Follow up with Dr. Jonni Sanger as planned and let her know if you are still fatigued and to recheck your BP.       PLEASE NOTE:  If you had any lab tests please let us know if you have not heard back within a few days. You may see your results on MyChart before we have a chance to review them but we will give you a call once they are reviewed by [place]. If we ordered any referrals today, please let us know if you have not heard from their office within the next week.

## 2022-06-27 ENCOUNTER — Encounter: Payer: Self-pay | Admitting: Family Medicine

## 2022-06-27 ENCOUNTER — Ambulatory Visit (INDEPENDENT_AMBULATORY_CARE_PROVIDER_SITE_OTHER): Payer: Medicare Other | Admitting: Family Medicine

## 2022-06-27 VITALS — BP 130/60 | HR 55 | Temp 97.8°F | Ht 64.0 in | Wt 173.2 lb

## 2022-06-27 DIAGNOSIS — N184 Chronic kidney disease, stage 4 (severe): Secondary | ICD-10-CM | POA: Diagnosis not present

## 2022-06-27 DIAGNOSIS — R7301 Impaired fasting glucose: Secondary | ICD-10-CM | POA: Diagnosis not present

## 2022-06-27 DIAGNOSIS — H903 Sensorineural hearing loss, bilateral: Secondary | ICD-10-CM

## 2022-06-27 DIAGNOSIS — G250 Essential tremor: Secondary | ICD-10-CM

## 2022-06-27 DIAGNOSIS — K219 Gastro-esophageal reflux disease without esophagitis: Secondary | ICD-10-CM

## 2022-06-27 DIAGNOSIS — M1030 Gout due to renal impairment, unspecified site: Secondary | ICD-10-CM

## 2022-06-27 DIAGNOSIS — I10 Essential (primary) hypertension: Secondary | ICD-10-CM | POA: Diagnosis not present

## 2022-06-27 DIAGNOSIS — Z0000 Encounter for general adult medical examination without abnormal findings: Secondary | ICD-10-CM | POA: Diagnosis not present

## 2022-06-27 DIAGNOSIS — D631 Anemia in chronic kidney disease: Secondary | ICD-10-CM

## 2022-06-27 DIAGNOSIS — M159 Polyosteoarthritis, unspecified: Secondary | ICD-10-CM

## 2022-06-27 LAB — RENAL FUNCTION PANEL
Albumin: 4.2 g/dL (ref 3.5–5.2)
BUN: 45 mg/dL — ABNORMAL HIGH (ref 6–23)
CO2: 23 mEq/L (ref 19–32)
Calcium: 9.8 mg/dL (ref 8.4–10.5)
Chloride: 111 mEq/L (ref 96–112)
Creatinine, Ser: 2.2 mg/dL — ABNORMAL HIGH (ref 0.40–1.20)
GFR: 20.57 mL/min — ABNORMAL LOW (ref >60.00)
Glucose, Bld: 116 mg/dL — ABNORMAL HIGH (ref 70–99)
Phosphorus: 3.8 mg/dL (ref 2.3–4.6)
Potassium: 4.7 mEq/L (ref 3.5–5.1)
Sodium: 145 mEq/L (ref 135–145)

## 2022-06-27 LAB — CBC WITH DIFFERENTIAL/PLATELET
Basophils Absolute: 0 10*3/uL (ref 0.0–0.1)
Basophils Relative: 0.7 % (ref 0.0–3.0)
Eosinophils Absolute: 0.3 10*3/uL (ref 0.0–0.7)
Eosinophils Relative: 3.6 % (ref 0.0–5.0)
HCT: 36.8 % (ref 36.0–46.0)
Hemoglobin: 12.2 g/dL (ref 12.0–15.0)
Lymphocytes Relative: 29.1 % (ref 12.0–46.0)
Lymphs Abs: 2 10*3/uL (ref 0.7–4.0)
MCHC: 33.2 g/dL (ref 30.0–36.0)
MCV: 96.7 fl (ref 78.0–100.0)
Monocytes Absolute: 0.5 10*3/uL (ref 0.1–1.0)
Monocytes Relative: 7.1 % (ref 3.0–12.0)
Neutro Abs: 4.2 10*3/uL (ref 1.4–7.7)
Neutrophils Relative %: 59.5 % (ref 43.0–77.0)
Platelets: 227 10*3/uL (ref 150.0–400.0)
RBC: 3.81 Mil/uL — ABNORMAL LOW (ref 3.87–5.11)
RDW: 13.7 % (ref 11.5–15.5)
WBC: 7 10*3/uL (ref 4.0–10.5)

## 2022-06-27 LAB — TSH: TSH: 1.64 u[IU]/mL (ref 0.35–5.50)

## 2022-06-27 LAB — HEPATIC FUNCTION PANEL
ALT: 14 U/L (ref 0–35)
AST: 20 U/L (ref 0–37)
Albumin: 4.2 g/dL (ref 3.5–5.2)
Alkaline Phosphatase: 86 U/L (ref 39–117)
Bilirubin, Direct: 0.1 mg/dL (ref 0.0–0.3)
Total Bilirubin: 0.6 mg/dL (ref 0.2–1.2)
Total Protein: 7.1 g/dL (ref 6.0–8.3)

## 2022-06-27 LAB — HEMOGLOBIN A1C: Hgb A1c MFr Bld: 6.2 % (ref 4.6–6.5)

## 2022-06-27 LAB — URIC ACID: Uric Acid, Serum: 6.9 mg/dL (ref 2.4–7.0)

## 2022-06-27 NOTE — Progress Notes (Signed)
Subjective  Chief Complaint  Patient presents with   Annual Exam    Pt here for Annual Exam and is currently fasting     HPI: Sabrina Ramirez is a 82 y.o. female who presents to [place] [facility] [place] today for a Female Wellness Visit. She also has the concerns and/or needs as listed above in the chief complaint. These will be addressed in addition to the Health Maintenance Visit.   Wellness Visit: annual visit with health maintenance review and exam without Pap  HM: screens are current. Imms up to date. Doing well overall. Active.  Chronic disease f/u and/or acute problem visit: (deemed necessary to be done in addition to the wellness visit): Bronchitis: starting to improve over last 48 hours. Reviewed last f/u note. No sob or fevers or pleuritic cp.  HTN: elevated bp here in office today but hacking and sick. Prior to illness, bp had been well controlled. Cp or racing heart rate IFG: no sxs of hyperglycemia. Weight is stable. Diet is stable.  Ckd: due for labs. No sxs. Gout: on allopurinol and due recheck. No recent flares: last aug 2023.  Assessment  1. Annual physical exam   2. Benign essential hypertension   3. IFG (impaired fasting glucose)   4. Chronic kidney disease, stage 4 (severe) (HCC)   5. Anemia associated with stage 4 chronic renal failure ([place])   6. Gastroesophageal reflux disease, unspecified whether esophagitis present   7. Primary osteoarthritis involving multiple joints   8. Essential tremor   9. Gout due to renal impairment, unspecified chronicity, unspecified site   10. Sensorineural hearing loss (SNHL) of both ears      Plan  Female Wellness Visit: Age appropriate Health Maintenance and Prevention measures were discussed with patient. Included topics are cancer screening recommendations, ways to keep healthy (see AVS) including dietary and exercise recommendations, regular eye and dental care, use of seat belts, and avoidance of moderate  alcohol use and tobacco use.  BMI: discussed patient's BMI and encouraged positive lifestyle modifications to help get to or maintain a target BMI. HM needs and immunizations were addressed and ordered. See below for orders. See HM and immunization section for updates. Routine labs and screening tests ordered including cmp, cbc and lipids where appropriate. Discussed recommendations regarding Vit D and calcium supplementation (see AVS)  Chronic disease management visit and/or acute problem visit: Bronchitis: resolving. Monitor.  HTN: controlled on meds. Will start monitoring at home again once over illness to ensure stable. Elevated readings today due to cough/illness IFG recheck A1c CKD4: recheck lab work and send to nephrology.  Check uric acid on allopurinol 100mg  daily.  Tremor is stable Hearing loss: to get hearing aides from audiology.  Follow up: No follow-ups on file.  Orders Placed This Encounter  Procedures   CBC with Differential/Platelet   Hepatic function panel   Renal function panel   TSH   Hemoglobin A1c   Uric acid   No orders of the defined types were placed in this encounter.     Body mass index is 29.73 kg/m. Wt Readings from Last 3 Encounters:  06/27/22 173 lb 3.2 oz (78.6 kg)  06/18/22 174 lb 4 oz (79 kg)  06/11/22 175 lb 12.8 oz (79.7 kg)     Patient Active Problem List   Diagnosis Date Noted   Anemia associated with stage 4 chronic renal failure ([place]) 09/25/2021    Priority: High   Chronic kidney disease, stage 4 (  severe) ([place]) 09/24/2021    Priority: High    Nl SPEP,UPEP and mild cortical thinning on renal ultrasound 2014 [facility] 2023    IFG (impaired fasting glucose) 03/09/2019    Priority: High    a1c 6.3 02/2019    Obesity, Class I, BMI 30-34.9 04/16/2015    Priority: High   Hypertriglyceridemia 09/22/2011    Priority: High   Benign essential hypertension 05/23/2010    Priority: High   Gout due to renal impairment 05/12/2022     Priority: Medium    Nephrolithiasis 03/22/2021    Priority: Medium     Right ureteral stone; [facility] 2022    Localized primary osteoarthritis of carpometacarpal joint of left thumb 03/08/2019    Priority: Medium    On statin therapy due to risk of future cardiovascular event 12/01/2017    Priority: Medium    Spondylolisthesis at L5-S1 level 08/13/2017    Priority: Medium    Colon polyp 05/25/2017    Priority: Medium     Overview:  1968, none on subsequent colonoscopy, [facility], last 2012    Ophthalmic migraine 05/25/2017    Priority: Medium    Essential tremor 05/25/2017    Priority: Medium    Allergic asthma, mild persistent, uncomplicated 37/62/8315    Priority: Medium     Dr. Donneta Romberg, allergist    Irritable bowel syndrome with diarrhea 07/08/2010    Priority: Medium    Chronic allergic rhinitis 02/25/2010    Priority: Medium    GERD (gastroesophageal reflux disease) 02/25/2010    Priority: Medium     Overview:  On chronic PPI since early 2000s. Neg H.pylor    Osteoarthritis, multiple sites 06/12/2009    Priority: Medium    Allergic rhinitis due to pollen 01/24/2022    Priority: Low   Conjunctivitis, allergic, chronic 10/01/2012    Priority: Low   Rosacea 06/25/2011    Priority: Low   Psoriasis 02/25/2010    Priority: Low   Sensorineural hearing loss (SNHL) of both ears 06/27/2022   Health Maintenance  Topic Date Due   COVID-19 Vaccine (6 - 2023-24 season) 06/27/2022 (Originally 02/07/2022)   MAMMOGRAM  03/27/2023   Medicare Annual Wellness (AWV)  04/25/2023   DEXA SCAN  04/28/2023   DTaP/Tdap/Td (3 - Td or Tdap) 01/25/2032   Pneumonia Vaccine 36+ Years old  Completed   INFLUENZA VACCINE  Completed   Zoster Vaccines- Shingrix  Completed   HPV VACCINES  Aged Out   Immunization History  Administered Date(s) Administered   Fluad Quad(high Dose 65+) 03/06/2016, 04/09/2017, 03/08/2019, 03/27/2020, 03/22/2021, 03/10/2022   Influenza, High Dose Seasonal PF  03/02/2012, 04/06/2013, 04/21/2014, 04/16/2015, 03/06/2016, 04/09/2017, 03/01/2018   Influenza-Unspecified 04/12/2011   PFIZER(Purple Top)SARS-COV-2 Vaccination 07/02/2019, 07/23/2019, 09/16/2019, 03/06/2020, 03/20/2021   Pfizer Covid-19 Vaccine Bivalent Booster 57yrs & up 03/20/2021   Pneumococcal Conjugate-13 10/05/2014   Pneumococcal Polysaccharide-23 04/23/2010, 11/12/2015, 07/01/2016, 07/13/2017, 01/11/2018, 09/09/2021   Pneumococcal-Unspecified 04/23/2010   Tdap 05/23/2010, 01/24/2022   Zoster Recombinat (Shingrix) 11/18/2017, 03/23/2018   Zoster, Live 06/09/2004   We updated and reviewed the patient's past history in detail and it is documented below. Allergies: Patient is allergic to amlodipine, flexeril [cyclobenzaprine], chlor-trimeton [chlorpheniramine], ace inhibitors, adhesive [tape], angiotensin receptor blockers, corn-containing products, iodine, and triamterene-hctz. Past Medical History Patient  has a past medical history of Allergic asthma, mild intermittent, uncomplicated (17/61/6073), Arthritis, Chronic kidney disease, GERD (gastroesophageal reflux disease), Gout flare (01/17/2022), Hemorrhoids, History of kidney stones, Hypertension, IBS (irritable bowel syndrome), IFG (impaired fasting glucose) (  03/09/2019), Nephrolithiasis (03/22/2021), and Psoriasis. Past Surgical History Patient  has a past surgical history that includes Cholecystectomy; Hemorrhoid surgery; Cataract extraction w/ intraocular lens  implant, bilateral (Bilateral, 2015); Abdominal hysterectomy (1987); Tonsillectomy; and Spine surgery (07/10/2017). Family History: Patient family history includes Arthritis in her maternal aunt; COPD in her maternal aunt and maternal grandmother; Deep vein thrombosis in her maternal grandfather and paternal grandmother; Diabetes in her father, paternal uncle, and sister; Heart attack in her father; Heart disease in her father, maternal grandmother, and paternal uncle;  Hyperlipidemia in her father; Hypertension in her daughter, father, maternal aunt, paternal grandmother, and sister; Stroke in her father, maternal grandfather, mother, and paternal uncle; Transient ischemic attack in her sister. Social History:  Patient  reports that she has never smoked. She has never used smokeless tobacco. She reports current alcohol use. She reports that she does not use drugs.  Review of Systems: Constitutional: negative for fever or malaise Ophthalmic: negative for photophobia, double vision or loss of vision Cardiovascular: negative for chest pain, dyspnea on exertion, or new LE swelling Respiratory: negative for SOB or persistent cough Gastrointestinal: negative for abdominal pain, change in bowel habits or melena Genitourinary: negative for dysuria or gross hematuria, no abnormal uterine bleeding or disharge Musculoskeletal: negative for new gait disturbance or muscular weakness Integumentary: negative for new or persistent rashes, no breast lumps Neurological: negative for TIA or stroke symptoms Psychiatric: negative for SI or delusions Allergic/Immunologic: negative for hives  Patient Care Team    Relationship Specialty Notifications Start End  Leamon Arnt, MD PCP - General Family Medicine  05/25/17   Mosetta Anis, MD Referring Physician Allergy  05/25/17   Consuella Lose, MD Consulting Physician Neurosurgery  10/28/17   [place], [place]  Dentistry  10/28/17   Warren Danes, PA-C Physician Assistant Dermatology  09/18/21   Reesa Chew, MD Consulting Physician Nephrology  12/25/21     Objective  Vitals: BP 130/60 Comment: prior readings when well  Pulse (!) 55   Temp 97.8 F (36.6 C)   Ht 5\' 4"  (1.626 m)   Wt 173 lb 3.2 oz (78.6 kg)   SpO2 95%   BMI 29.73 kg/m  General:  Well developed, well nourished, no acute distress  Psych:  Alert and orientedx3,normal mood and affect HEENT:  Normocephalic, atraumatic, non-icteric sclera,   supple neck without adenopathy, mass or thyromegaly Cardiovascular:  Normal S1, S2, RRR without gallop, rub or murmur Respiratory:  Good breath sounds bilaterally, CTAB with normal respiratory effort but coughing with chest congestion audible Gastrointestinal: normal bowel sounds, soft, non-tender, no noted masses. No HSM MSK: no deformities, contusions. Joints are without erythema or swelling.  Skin:  Warm, no rashes or suspicious lesions noted Neurologic:    Mental status is normal. CN 2-11 are normal. Gross motor and sensory exams are normal. Normal gait. No tremor   Commons side effects, risks, benefits, and alternatives for medications and treatment plan prescribed today were discussed, and the patient expressed understanding of the given instructions. Patient is instructed to call or message via MyChart if he/she has any questions or concerns regarding our treatment plan. No barriers to understanding were identified. We discussed Red Flag symptoms and signs in detail. Patient expressed understanding regarding what to do in case of urgent or emergency type symptoms.  Medication list was reconciled, printed and provided to the patient in AVS. Patient instructions and summary information was reviewed with the patient as documented in the AVS. This note was prepared  with assistance of Systems analyst. Occasional wrong-word or sound-a-like substitutions may have occurred due to the inherent limitations of voice recognition software

## 2022-06-27 NOTE — Patient Instructions (Addendum)

## 2022-07-09 LAB — LAB REPORT - SCANNED
Creatinine, POC: 197 mg/dL
EGFR: 22
Protein/Creatinine Ratio: 303

## 2022-07-11 ENCOUNTER — Other Ambulatory Visit: Payer: Self-pay | Admitting: Family Medicine

## 2022-07-15 ENCOUNTER — Other Ambulatory Visit: Payer: Self-pay | Admitting: Family Medicine

## 2022-09-11 ENCOUNTER — Other Ambulatory Visit: Payer: Self-pay | Admitting: Family Medicine

## 2022-09-15 ENCOUNTER — Other Ambulatory Visit: Payer: Self-pay | Admitting: Family Medicine

## 2022-09-15 NOTE — Telephone Encounter (Signed)
OK to refill? Please advise 

## 2022-09-24 ENCOUNTER — Ambulatory Visit: Payer: Medicare Other | Admitting: Physician Assistant

## 2022-10-18 ENCOUNTER — Other Ambulatory Visit: Payer: Self-pay | Admitting: Family Medicine

## 2022-11-24 ENCOUNTER — Encounter: Payer: Self-pay | Admitting: Family Medicine

## 2022-11-24 ENCOUNTER — Ambulatory Visit: Payer: Medicare Other | Admitting: Family Medicine

## 2022-11-24 VITALS — BP 158/64 | HR 56 | Temp 97.8°F | Ht 64.0 in | Wt 177.8 lb

## 2022-11-24 DIAGNOSIS — I10 Essential (primary) hypertension: Secondary | ICD-10-CM | POA: Diagnosis not present

## 2022-11-24 DIAGNOSIS — N184 Chronic kidney disease, stage 4 (severe): Secondary | ICD-10-CM

## 2022-11-24 DIAGNOSIS — L658 Other specified nonscarring hair loss: Secondary | ICD-10-CM | POA: Diagnosis not present

## 2022-11-24 DIAGNOSIS — Z7184 Encounter for health counseling related to travel: Secondary | ICD-10-CM | POA: Diagnosis not present

## 2022-11-24 DIAGNOSIS — K580 Irritable bowel syndrome with diarrhea: Secondary | ICD-10-CM

## 2022-11-24 MED ORDER — DIAZEPAM 5 MG PO TABS: 2.5000 mg | ORAL_TABLET | Freq: Two times a day (BID) | ORAL | 0 refills | Status: DC | PRN

## 2022-11-24 MED ORDER — SULFAMETHOXAZOLE-TRIMETHOPRIM 400-80 MG PO TABS: 1.0000 | ORAL_TABLET | Freq: Two times a day (BID) | ORAL | 0 refills | Status: DC

## 2022-11-24 MED ORDER — MECLIZINE HCL 25 MG PO TABS: 25.0000 mg | ORAL_TABLET | Freq: Three times a day (TID) | ORAL | 0 refills | Status: DC | PRN

## 2022-11-24 MED ORDER — HYDRALAZINE HCL 10 MG PO TABS: 10.0000 mg | ORAL_TABLET | Freq: Three times a day (TID) | ORAL | 5 refills | Status: DC

## 2022-11-24 NOTE — Patient Instructions (Addendum)
Please follow up as scheduled for your next visit with me: 12/29/2022 to recheck blood pressure.   Do not use the meclizine and valium together.  Use immodium  If you have any questions or concerns, please don't hesitate to send me a message via MyChart or call the office at [phone]. Thank you for visiting with [place] today! It's our pleasure caring for you.

## 2022-11-24 NOTE — Progress Notes (Signed)
Subjective  CC:  Chief Complaint  Patient presents with   Questions with upcoming travels    Pt is leaving out of [place]05[place] and wanted to know if she should wear compression socks and concerns with IBS    HPI: Sabrina Ramirez is a 82 y.o. female who presents to the office today to address the problems listed above in the chief complaint. 82 year old female with history of IBS with diarrhea, triggered by stress presents to discuss management.  She will be traveling to [place] in a few weeks and is worried about having accidents.  She does report that Imodium is helpful.  IBS has been well-controlled but she does know that she is quite nervous about this trip.  She uses a pad in case of accidents.  No urinary symptoms.  No abdominal pain.  No fevers or chills. Asks about compression hose, no history of heart failure or significant edema but intermittently she will have dependent edema with travel.  She does have chronic kidney disease.  No history of DVTs Hair thinning over the last several years.  Asks if there is anything that can be done about that.  No rashes Hypertension: Reviewed renal notes from the last visits and blood pressure was elevated there.  Home blood pressure readings range between 130s over 80s to 160s over 90s.  She feels well.  She takes metoprolol XL 100 daily.  She has had difficulties with multiple antihypertensives including ACE, ARB, diuretics.  She had what she noted to be intolerable swelling with amlodipine.  She is quite stressed about her upcoming trip and thinks that that has elevated her reading somewhat.  No chest pain or shortness of breath.  No edema now.  Assessment  1. Travel advice encounter   2. Benign essential hypertension   3. Chronic kidney disease, stage 4 (severe) (HCC)   4. Irritable bowel syndrome with diarrhea   5. Female pattern hair loss      Plan  Travel advice encounter: Educated on management of travel.  Due to her anxiety will  use Valium for travel, she is taking her right eye.  Discussed risk and benefits.  She has used this before in the past and tolerated well.  She will try a small dose at home prior to her trip to ensure that she tolerates it.  Also gave her meclizine to use for water travel and educated that she should not use these 2 medications together.  Compression stockings would be fine to use while on the plane, discussed DVT prophylaxis. IBS with diarrhea, triggered by stress.  Recommend starting Imodium daily several days prior to leaving.  And to take once to twice daily as needed.  If stooling becomes less frequent or more hard, she can back off. Female pattern hair loss: Education given.  She can try Rogaine if it bothers her greatly. Benign hypertension with chronic kidney disease: Toprol-XL 100 daily but given elevated persistent readings and hydralazine 10 3 times daily.  She will start this medication after her trip to avoid problems.  She will continue home monitoring and follow-up here next month.  Follow up: As scheduled for recheck 12/29/2022  No orders of the defined types were placed in this encounter.  Meds ordered this encounter  Medications   hydrALAZINE (APRESOLINE) 10 MG tablet    Sig: Take 1 tablet (10 mg total) by mouth 3 (three) times daily.    Dispense:  90 tablet    Refill:  5  meclizine (ANTIVERT) 25 MG tablet    Sig: Take 1 tablet (25 mg total) by mouth 3 (three) times daily as needed for dizziness.    Dispense:  30 tablet    Refill:  0   diazepam (VALIUM) 5 MG tablet    Sig: Take 0.5-1 tablets (2.5-5 mg total) by mouth every 12 (twelve) hours as needed for anxiety.    Dispense:  20 tablet    Refill:  0   sulfamethoxazole-trimethoprim (BACTRIM) 400-80 MG tablet    Sig: Take 1 tablet by mouth 2 (two) times daily.    Dispense:  14 tablet    Refill:  0      I reviewed the patients updated PMH, FH, and SocHx.    Patient Active Problem List   Diagnosis Date Noted    Anemia associated with stage 4 chronic renal failure (HCC) 09/25/2021    Priority: High   Chronic kidney disease, stage 4 (severe) (HCC) 09/24/2021    Priority: High   IFG (impaired fasting glucose) 03/09/2019    Priority: High   Obesity, Class I, BMI 30-34.9 04/16/2015    Priority: High   Hypertriglyceridemia 09/22/2011    Priority: High   Benign essential hypertension 05/23/2010    Priority: High   Gout due to renal impairment 05/12/2022    Priority: Medium    Nephrolithiasis 03/22/2021    Priority: Medium    Localized primary osteoarthritis of carpometacarpal joint of left thumb 03/08/2019    Priority: Medium    On statin therapy due to risk of future cardiovascular event 12/01/2017    Priority: Medium    Spondylolisthesis at L5-S1 level 08/13/2017    Priority: Medium    Colon polyp 05/25/2017    Priority: Medium    Ophthalmic migraine 05/25/2017    Priority: Medium    Essential tremor 05/25/2017    Priority: Medium    Allergic asthma, mild persistent, uncomplicated 05/25/2017    Priority: Medium    Irritable bowel syndrome with diarrhea 07/08/2010    Priority: Medium    Chronic allergic rhinitis 02/25/2010    Priority: Medium    GERD (gastroesophageal reflux disease) 02/25/2010    Priority: Medium    Osteoarthritis, multiple sites 06/12/2009    Priority: Medium    Sensorineural hearing loss (SNHL) of both ears 06/27/2022    Priority: Low   Allergic rhinitis due to pollen 01/24/2022    Priority: Low   Conjunctivitis, allergic, chronic 10/01/2012    Priority: Low   Rosacea 06/25/2011    Priority: Low   Psoriasis 02/25/2010    Priority: Low   Female pattern hair loss 11/24/2022   Current Meds  Medication Sig   diazepam (VALIUM) 5 MG tablet Take 0.5-1 tablets (2.5-5 mg total) by mouth every 12 (twelve) hours as needed for anxiety.   hydrALAZINE (APRESOLINE) 10 MG tablet Take 1 tablet (10 mg total) by mouth 3 (three) times daily.   meclizine (ANTIVERT) 25 MG  tablet Take 1 tablet (25 mg total) by mouth 3 (three) times daily as needed for dizziness.   sulfamethoxazole-trimethoprim (BACTRIM) 400-80 MG tablet Take 1 tablet by mouth 2 (two) times daily.    Allergies: Patient is allergic to amlodipine, flexeril [cyclobenzaprine], chlor-trimeton [chlorpheniramine], ace inhibitors, adhesive [tape], angiotensin receptor blockers, corn-containing products, iodine, and triamterene-hctz. Family History: Patient family history includes Arthritis in her maternal aunt; COPD in her maternal aunt and maternal grandmother; Deep vein thrombosis in her maternal grandfather and paternal grandmother; Diabetes in her father, paternal uncle,  and sister; Heart attack in her father; Heart disease in her father, maternal grandmother, and paternal uncle; Hyperlipidemia in her father; Hypertension in her daughter, father, maternal aunt, paternal grandmother, and sister; Stroke in her father, maternal grandfather, mother, and paternal uncle; Transient ischemic attack in her sister. Social History:  Patient  reports that she has never smoked. She has never used smokeless tobacco. She reports current alcohol use. She reports that she does not use drugs.  Review of Systems: Constitutional: Negative for fever malaise or anorexia Cardiovascular: negative for chest pain Respiratory: negative for SOB or persistent cough Gastrointestinal: negative for abdominal pain  Objective  Vitals: BP (!) 158/64   Pulse (!) 56   Temp 97.8 F (36.6 C)   Ht 5\' 4"  (1.626 m)   Wt 177 lb 12.8 oz (80.6 kg)   SpO2 97%   BMI 30.52 kg/m  General: no acute distress , A&Ox3, slightly anxious appearing today HEENT: PEERL, conjunctiva normal, neck is supple Cardiovascular:  RRR without murmur or gallop.  No edema Respiratory:  Good breath sounds bilaterally, CTAB with normal respiratory effort Skin:  Warm, no rashes  Commons side effects, risks, benefits, and alternatives for medications and  treatment plan prescribed today were discussed, and the patient expressed understanding of the given instructions. Patient is instructed to call or message via MyChart if he/she has any questions or concerns regarding our treatment plan. No barriers to understanding were identified. We discussed Red Flag symptoms and signs in detail. Patient expressed understanding regarding what to do in case of urgent or emergency type symptoms.  Medication list was reconciled, printed and provided to the patient in AVS. Patient instructions and summary information was reviewed with the patient as documented in the AVS. This note was prepared with assistance of Dragon voice recognition software. Occasional wrong-word or sound-a-like substitutions may have occurred due to the inherent limitations of voice recognition software

## 2022-12-19 ENCOUNTER — Other Ambulatory Visit: Payer: Self-pay | Admitting: Family Medicine

## 2022-12-29 ENCOUNTER — Ambulatory Visit: Payer: Medicare Other | Admitting: Family Medicine

## 2023-01-05 ENCOUNTER — Ambulatory Visit: Payer: Medicare Other | Admitting: Family Medicine

## 2023-01-05 ENCOUNTER — Encounter: Payer: Self-pay | Admitting: Family Medicine

## 2023-01-05 VITALS — BP 138/60 | HR 59 | Temp 97.6°F | Ht 64.0 in | Wt 176.8 lb

## 2023-01-05 DIAGNOSIS — N184 Chronic kidney disease, stage 4 (severe): Secondary | ICD-10-CM | POA: Diagnosis not present

## 2023-01-05 DIAGNOSIS — I10 Essential (primary) hypertension: Secondary | ICD-10-CM

## 2023-01-05 DIAGNOSIS — R7301 Impaired fasting glucose: Secondary | ICD-10-CM | POA: Diagnosis not present

## 2023-01-05 NOTE — Progress Notes (Signed)
Subjective  CC:  Chief Complaint  Patient presents with   Hypertension   Chronic Kidney Disease    HPI: Sabrina Ramirez is a 82 y.o. female who presents to the office today to address the problems listed above in the chief complaint. Hypertension f/u: Control is good . Pt reports she is doing well.  Added hydralazine 10 mg 3 times daily to metoprolol last visit.  She is tolerating well.  Blood pressures are normal at home.  No adverse effects.  She denies adverse effects from his BP medications. Compliance with medication is good.  Doing well. Had a wonderful trip to [place]; was able to manage her IBS and back pain with meds and a wheelchair. Didn't need the valium or meclizine or septra!  Feels well. Eating well. No concerns.   Assessment  1. Benign essential hypertension   2. Chronic kidney disease, stage 4 (severe) (HCC)   3. IFG (impaired fasting glucose)      Plan   Hypertension f/u: BP control is fairly well controlled. Continue metoprolol and hydralazine.  CKD: no sxs of volume overload. Has f/u with renal.  IFG: eating well avoiding sweets and no sxs of hyperglycemia.  H/o normal bone density x 2 over 5 years; discussed further screens; elect to stop. Low risk of developing osteoporosis.  Schedule mammogram.   Education regarding management of these chronic disease states was given. Management strategies discussed on successive visits include dietary and exercise recommendations, goals of achieving and maintaining IBW, and lifestyle modifications aiming for adequate sleep and minimizing stressors.   Follow up: 6 mo for cpe  No orders of the defined types were placed in this encounter.  No orders of the defined types were placed in this encounter.     BP Readings from Last 3 Encounters:  01/05/23 138/60  11/24/22 (!) 158/64  06/27/22 130/60   Wt Readings from Last 3 Encounters:  01/05/23 176 lb 12.8 oz (80.2 kg)  11/24/22 177 lb 12.8 oz (80.6 kg)  06/27/22 173  lb 3.2 oz (78.6 kg)    Lab Results  Component Value Date   CHOL 116 03/22/2021   CHOL 137 03/27/2020   CHOL 124 03/08/2019   Lab Results  Component Value Date   HDL 42.70 03/22/2021   HDL 43 (L) 03/27/2020   HDL 41.80 03/08/2019   Lab Results  Component Value Date   LDLCALC 46 03/22/2021   LDLCALC 67 03/27/2020   LDLCALC 50 03/08/2019   Lab Results  Component Value Date   TRIG 138.0 03/22/2021   TRIG 199 (H) 03/27/2020   TRIG 159.0 (H) 03/08/2019   Lab Results  Component Value Date   CHOLHDL 3 03/22/2021   CHOLHDL 3.2 03/27/2020   CHOLHDL 3 03/08/2019   No results found for: "LDLDIRECT" Lab Results  Component Value Date   CREATININE 2.20 (H) 06/27/2022   BUN 45 (H) 06/27/2022   NA 145 06/27/2022   K 4.7 06/27/2022   CL 111 06/27/2022   CO2 23 06/27/2022    The ASCVD Risk score (Arnett DK, et al., 2019) failed to calculate for the following reasons:   The 2019 ASCVD risk score is only valid for ages 50 to 35  I reviewed the patients updated PMH, FH, and SocHx.    Patient Active Problem List   Diagnosis Date Noted   Anemia associated with stage 4 chronic renal failure (HCC) 09/25/2021    Priority: High   Chronic kidney disease, stage 4 (severe) (HCC)  09/24/2021    Priority: High   IFG (impaired fasting glucose) 03/09/2019    Priority: High   Obesity, Class I, BMI 30-34.9 04/16/2015    Priority: High   Hypertriglyceridemia 09/22/2011    Priority: High   Benign essential hypertension 05/23/2010    Priority: High   Gout due to renal impairment 05/12/2022    Priority: Medium    Nephrolithiasis 03/22/2021    Priority: Medium    Localized primary osteoarthritis of carpometacarpal joint of left thumb 03/08/2019    Priority: Medium    On statin therapy due to risk of future cardiovascular event 12/01/2017    Priority: Medium    Spondylolisthesis at L5-S1 level 08/13/2017    Priority: Medium    Colon polyp 05/25/2017    Priority: Medium    Ophthalmic  migraine 05/25/2017    Priority: Medium    Essential tremor 05/25/2017    Priority: Medium    Allergic asthma, mild persistent, uncomplicated 05/25/2017    Priority: Medium    Irritable bowel syndrome with diarrhea 07/08/2010    Priority: Medium    Chronic allergic rhinitis 02/25/2010    Priority: Medium    GERD (gastroesophageal reflux disease) 02/25/2010    Priority: Medium    Osteoarthritis, multiple sites 06/12/2009    Priority: Medium    Female pattern hair loss 11/24/2022    Priority: Low   Sensorineural hearing loss (SNHL) of both ears 06/27/2022    Priority: Low   Allergic rhinitis due to pollen 01/24/2022    Priority: Low   Conjunctivitis, allergic, chronic 10/01/2012    Priority: Low   Rosacea 06/25/2011    Priority: Low   Psoriasis 02/25/2010    Priority: Low    Allergies: Amlodipine, Flexeril [cyclobenzaprine], Chlor-trimeton [chlorpheniramine], Ace inhibitors, Adhesive [tape], Angiotensin receptor blockers, Corn-containing products, Iodine, and Triamterene-hctz  Social History: Patient  reports that she has never smoked. She has never used smokeless tobacco. She reports current alcohol use. She reports that she does not use drugs.  Current Meds  Medication Sig   Acetaminophen (TYLENOL PO) Take by mouth daily as needed.   Albuterol Sulfate (PROAIR RESPICLICK) 108 (90 Base) MCG/ACT AEPB Inhale 1-2 puffs into the lungs every 6 (six) hours as needed (for wheezing/shortness of breath).    allopurinol (ZYLOPRIM) 100 MG tablet Take 100 mg by mouth daily.   aspirin EC 81 MG tablet Take 1 tablet (81 mg total) by mouth at bedtime.   ciclopirox (PENLAC) 8 % solution Apply topically at bedtime. Apply over nail and surrounding skin. Apply daily over previous coat. After seven (7) days, may remove with alcohol and continue cycle.   fluocinonide cream (LIDEX) 0.05 % APPLY CREAM SPARINGLY TO AFFECTED AREA THREE TIMES DAILY FOR PSORIASIS FLARES. DO NOT USE LONGER THAN 2 WEEKS.    fluticasone furoate-vilanterol (BREO ELLIPTA) 200-25 MCG/INH AEPB Inhale 1 puff into the lungs daily.   Glucosamine-Chondroit-Vit C-Mn (GLUCOSAMINE 1500 COMPLEX) CAPS Take by mouth.   loperamide (IMODIUM A-D) 2 MG tablet Take 2 mg by mouth 4 (four) times daily as needed for diarrhea or loose stools.   metoprolol succinate (TOPROL-XL) 100 MG 24 hr tablet Take 1 tablet by mouth once daily   montelukast (SINGULAIR) 10 MG tablet Take 10 mg by mouth at bedtime.    Multiple Vitamin (MULTIVITAMIN) tablet Take 1 tablet by mouth daily. Senior Multivitamin   nystatin powder Apply 1 Application topically 3 (three) times daily.   omeprazole (PRILOSEC OTC) 20 MG tablet Take 20 mg by  mouth daily.   OVER THE COUNTER MEDICATION Systane Ultra Eyedrops   rosuvastatin (CRESTOR) 5 MG tablet TAKE 1/2 (ONE-HALF) TABLET BY MOUTH IN THE EVENING   triamcinolone cream (KENALOG) 0.1 % Apply 1 application topically 2 (two) times daily as needed (for skin rash (Summertime)).     Review of Systems: Cardiovascular: negative for chest pain, palpitations, leg swelling, orthopnea Respiratory: negative for SOB, wheezing or persistent cough Gastrointestinal: negative for abdominal pain Genitourinary: negative for dysuria or gross hematuria  Objective  Vitals: BP 138/60   Pulse (!) 59   Temp 97.6 F (36.4 C)   Ht 5\' 4"  (1.626 m)   Wt 176 lb 12.8 oz (80.2 kg)   SpO2 97%   BMI 30.35 kg/m  General: no acute distress  Psych:  Alert and oriented, normal mood and affect HEENT:  Normocephalic, atraumatic, supple neck  Cardiovascular:  RRR without murmur. no edema Respiratory:  Good breath sounds bilaterally, CTAB with normal respiratory effort Skin:  Warm, no rashes Neurologic:   Mental status is normal Commons side effects, risks, benefits, and alternatives for medications and treatment plan prescribed today were discussed, and the patient expressed understanding of the given instructions. Patient is instructed to call or  message via MyChart if he/she has any questions or concerns regarding our treatment plan. No barriers to understanding were identified. We discussed Red Flag symptoms and signs in detail. Patient expressed understanding regarding what to do in case of urgent or emergency type symptoms.  Medication list was reconciled, printed and provided to the patient in AVS. Patient instructions and summary information was reviewed with the patient as documented in the AVS. This note was prepared with assistance of Dragon voice recognition software. Occasional wrong-word or sound-a-like substitutions may have occurred due to the inherent limitation

## 2023-01-05 NOTE — Patient Instructions (Signed)
Please return in 6 months for your annual complete physical; please come fasting.   If you have any questions or concerns, please don't hesitate to send me a message via MyChart or call the office at [phone]. Thank you for visiting with us today! It's our pleasure caring for you.  

## 2023-01-18 ENCOUNTER — Other Ambulatory Visit: Payer: Self-pay | Admitting: Family Medicine

## 2023-01-20 ENCOUNTER — Telehealth: Payer: Self-pay

## 2023-01-20 ENCOUNTER — Other Ambulatory Visit (HOSPITAL_COMMUNITY): Payer: Self-pay

## 2023-01-20 NOTE — Telephone Encounter (Signed)
Pharmacy Patient Advocate Encounter   Received notification from CoverMyMeds that prior authorization for Fluocinonide 0.05% cream is required/requested.   Insurance verification completed.   The patient is insured through [facility] [employer] .   Per test claim: PA required; PA submitted to [facility] via CoverMyMeds Key/confirmation #/EOC [id number] Status is pending

## 2023-01-21 ENCOUNTER — Other Ambulatory Visit (HOSPITAL_COMMUNITY): Payer: Self-pay

## 2023-01-21 NOTE — Telephone Encounter (Signed)
Pharmacy Patient Advocate Encounter  Received notification from [facility] that Prior Authorization for Fluocinonide 0.05% cream has been APPROVED from 01/20/23 to 06/09/23. Ran test claim, Copay is $6. This test claim was processed through [facility]- copay amounts may vary at other pharmacies due to pharmacy/plan contracts, or as the patient moves through the different stages of their insurance plan.   PA #/Case ID/Reference #: [id number]

## 2023-02-11 ENCOUNTER — Other Ambulatory Visit: Payer: Self-pay | Admitting: Family Medicine

## 2023-02-11 DIAGNOSIS — Z1231 Encounter for screening mammogram for malignant neoplasm of breast: Secondary | ICD-10-CM

## 2023-03-30 ENCOUNTER — Ambulatory Visit
Admission: RE | Admit: 2023-03-30 | Discharge: 2023-03-30 | Disposition: A | Discharge status: Home / Self Care | Payer: Medicare Other | Source: Ambulatory Visit | Attending: Family Medicine | Admitting: Family Medicine

## 2023-03-30 DIAGNOSIS — Z1231 Encounter for screening mammogram for malignant neoplasm of breast: Secondary | ICD-10-CM

## 2023-04-01 ENCOUNTER — Other Ambulatory Visit: Payer: Self-pay | Admitting: Family Medicine

## 2023-05-02 ENCOUNTER — Other Ambulatory Visit: Payer: Self-pay | Admitting: Family Medicine

## 2023-05-20 ENCOUNTER — Ambulatory Visit: Payer: Medicare Other

## 2023-05-20 VITALS — Wt 176.0 lb

## 2023-05-20 DIAGNOSIS — Z0000 Encounter for general adult medical examination without abnormal findings: Secondary | ICD-10-CM | POA: Diagnosis not present

## 2023-05-20 NOTE — Progress Notes (Signed)
Subjective:   Sabrina Ramirez is a 82 y.o. female who presents for Medicare Annual (Subsequent) preventive examination.  Visit Complete: Virtual I connected with  Sabrina Ramirez on 05/20/23 by a audio enabled telemedicine application and verified that I am speaking with the correct person using two identifiers.  Patient Location: Home  Provider Location: Home Office  I discussed the limitations of evaluation and management by telemedicine. The patient expressed understanding and agreed to proceed.  Vital Signs: Because this visit was a virtual/telehealth visit, some criteria may be missing or patient reported. Any vitals not documented were not able to be obtained and vitals that have been documented are patient reported.  Cardiac Risk Factors include: advanced age (>65men, >52 women);obesity (BMI >30kg/m2);hypertension     Objective:    Today's Vitals   05/20/23 1141  Weight: 176 lb (79.8 kg)   Body mass index is 30.21 kg/m.     05/20/2023   11:49 AM 04/24/2022    1:10 PM 01/15/2022   11:16 AM 03/29/2021   11:08 AM 03/11/2019    1:23 PM 01/26/2019    3:19 PM 02/01/2018   10:34 AM  Advanced Directives  Does Patient Have a Medical Advance Directive? Yes Yes No Yes Yes No Yes  Type of Estate agent of [place];Living will Healthcare Power of [place];Living will  Healthcare Power of Attorney Living will;Healthcare Power of [employer] Power of [place];Living will  Does patient want to make changes to medical advance directive? No - Patient declined No - Patient declined   No - Patient declined  No - Patient declined  Copy of Healthcare Power of Attorney in Chart? Yes - validated most recent copy scanned in chart (See row information) Yes - validated most recent copy scanned in chart (See row information)  Yes - validated most recent copy scanned in chart (See row information) No - copy requested  No - copy requested  Would patient like information  on creating a medical advance directive?   No - Patient declined   No - Patient declined No - Patient declined    Current Medications (verified) Outpatient Encounter Medications as of 05/20/2023  Medication Sig   Acetaminophen (TYLENOL PO) Take by mouth daily as needed.   Albuterol Sulfate (PROAIR RESPICLICK) 108 (90 Base) MCG/ACT AEPB Inhale 1-2 puffs into the lungs every 6 (six) hours as needed (for wheezing/shortness of breath).    allopurinol (ZYLOPRIM) 100 MG tablet Take 100 mg by mouth daily.   aspirin EC 81 MG tablet Take 1 tablet (81 mg total) by mouth at bedtime.   azelastine (ASTELIN) 0.1 % nasal spray Place into both nostrils 2 (two) times daily. Use in each nostril as directed   ciclopirox (PENLAC) 8 % solution Apply topically at bedtime. Apply over nail and surrounding skin. Apply daily over previous coat. After seven (7) days, may remove with alcohol and continue cycle.   fluocinonide cream (LIDEX) 0.05 % APPLY CREAM EXTERNALLY TO AFFECTED AREA THREE TIMES DAILY FOR PSORIASIS FLARES. DO NOT USE LONGER THAN 2 WEEKS   fluticasone furoate-vilanterol (BREO ELLIPTA) 200-25 MCG/INH AEPB Inhale 1 puff into the lungs daily.   Glucosamine-Chondroit-Vit C-Mn (GLUCOSAMINE 1500 COMPLEX) CAPS Take by mouth.   hydrALAZINE (APRESOLINE) 10 MG tablet Take 1 tablet (10 mg total) by mouth 3 (three) times daily.   loperamide (IMODIUM A-D) 2 MG tablet Take 2 mg by mouth 4 (four) times daily as needed for diarrhea or loose stools.   metoprolol succinate (  TOPROL-XL) 100 MG 24 hr tablet Take 1 tablet by mouth once daily   montelukast (SINGULAIR) 10 MG tablet Take 10 mg by mouth at bedtime.    Multiple Vitamin (MULTIVITAMIN) tablet Take 1 tablet by mouth daily. Senior Multivitamin   nystatin powder Apply 1 Application topically 3 (three) times daily.   omeprazole (PRILOSEC OTC) 20 MG tablet Take 20 mg by mouth daily.   OVER THE COUNTER MEDICATION Systane Ultra Eyedrops   rosuvastatin (CRESTOR) 5 MG  tablet TAKE 1/2 (ONE-HALF) TABLET BY MOUTH IN THE EVENING   triamcinolone cream (KENALOG) 0.1 % Apply 1 application topically 2 (two) times daily as needed (for skin rash (Summertime)).    No facility-administered encounter medications on file as of 05/20/2023.    Allergies (verified) Amlodipine, Flexeril [cyclobenzaprine], Chlor-trimeton [chlorpheniramine], Ace inhibitors, Adhesive [tape], Angiotensin receptor blockers, Corn-containing products, Iodine, and Triamterene-hctz   History: Past Medical History:  Diagnosis Date   Allergic asthma, mild intermittent, uncomplicated 05/25/2017   Dr. Woodson Callas, allergist   Arthritis    Chronic kidney disease    Stage III kidney disease   GERD (gastroesophageal reflux disease)    Gout flare 01/17/2022   Hemorrhoids    History of kidney stones    Hypertension    IBS (irritable bowel syndrome)    IFG (impaired fasting glucose) 03/09/2019   a1c 6.3 02/2019   Nephrolithiasis 03/22/2021   Right ureteral stone; [facility] 2022   Psoriasis    Past Surgical History:  Procedure Laterality Date   ABDOMINAL HYSTERECTOMY  1987   CATARACT EXTRACTION W/ INTRAOCULAR LENS  IMPLANT, BILATERAL Bilateral 2015   CHOLECYSTECTOMY     HEMORRHOID SURGERY     SPINE SURGERY  07/10/2017   TONSILLECTOMY     Family History  Problem Relation Age of Onset   Stroke Mother    Diabetes Father    Heart attack Father    Heart disease Father    Hyperlipidemia Father    Hypertension Father    Stroke Father    Diabetes Sister    Hypertension Sister    Transient ischemic attack Sister    COPD Maternal Grandmother    Heart disease Maternal Grandmother    Deep vein thrombosis Maternal Grandfather    Stroke Maternal Grandfather    Deep vein thrombosis Paternal Grandmother    Hypertension Paternal Grandmother    Hypertension Daughter    Arthritis Maternal Aunt    COPD Maternal Aunt    Hypertension Maternal Aunt    Diabetes Paternal Uncle    Heart disease  Paternal Uncle    Stroke Paternal Uncle    Breast cancer Neg Hx    Social History   Socioeconomic History   Marital status: Divorced    Spouse name: Not on file   Number of children: Not on file   Years of education: Not on file   Highest education level: Not on file  Occupational History   Occupation: Retired  Tobacco Use   Smoking status: Never   Smokeless tobacco: Never  Vaping Use   Vaping status: Never Used  Substance and Sexual Activity   Alcohol use: Yes    Comment: occassionally   Drug use: No   Sexual activity: Never  Other Topics Concern   Not on file  Social History Narrative   Not on file   Social Determinants of Health   Financial Resource Strain: Low Risk  (05/20/2023)   Overall Financial Resource Strain (CARDIA)    Difficulty of Paying Living Expenses:  Not hard at all  Food Insecurity: No Food Insecurity (05/20/2023)   Hunger Vital Sign    Worried About Running Out of Food in the Last Year: Never true    Ran Out of Food in the Last Year: Never true  Transportation Needs: No Transportation Needs (05/20/2023)   PRAPARE - Administrator, Civil Service (Medical): No    Lack of Transportation (Non-Medical): No  Physical Activity: Inactive (05/20/2023)   Exercise Vital Sign    Days of Exercise per Week: 0 days    Minutes of Exercise per Session: 0 min  Stress: No Stress Concern Present (05/20/2023)   [employer] of Occupational Health - Occupational Stress Questionnaire    Feeling of Stress : Not at all  Social Connections: Moderately Isolated (05/20/2023)   Social Connection and Isolation Panel [NHANES]    Frequency of Communication with Friends and Family: More than three times a week    Frequency of Social Gatherings with Friends and Family: More than three times a week    Attends Religious Services: More than 4 times per year    Active Member of [employer] or Organizations: No    Attends Engineer, structural: Never    Marital  Status: Divorced    Tobacco Counseling Counseling given: Not Answered   Clinical Intake:  Pre-visit preparation completed: Yes  Pain : No/denies pain     BMI - recorded: 30.21 Nutritional Status: BMI > 30  Obese Nutritional Risks: None Diabetes: No  How often do you need to have someone help you when you read instructions, pamphlets, or other written materials from your doctor or pharmacy?: 1 - Never  Interpreter Needed?: No  Information entered by :: Lanier Ensign, LPN   Activities of Daily Living    05/20/2023   11:43 AM  In your present state of health, do you have any difficulty performing the following activities:  Hearing? 1  Comment hearing aids  Vision? 0  Difficulty concentrating or making decisions? 0  Walking or climbing stairs? 0  Dressing or bathing? 0  Doing errands, shopping? 0  Preparing Food and eating ? N  Using the Toilet? N  In the past six months, have you accidently leaked urine? N  Do you have problems with loss of bowel control? N  Managing your Medications? N  Managing your Finances? N  Housekeeping or managing your Housekeeping? N    Patient Care Team: Willow Ora, MD as PCP - General (Family Medicine) Sidney Ace, MD as Referring Physician (Allergy) Lisbeth Renshaw, MD as Consulting Physician (Neurosurgery) Mottinger, Marcelino Duster (Dentistry) Glyn Ade, PA-C as Physician Assistant (Dermatology) Darnell Level, MD as Consulting Physician (Nephrology)  Indicate any recent Medical Services you may have received from other than [facility] providers in the past year (date may be approximate).     Assessment:   This is a routine wellness examination for [place].  Hearing/Vision screen Hearing Screening - Comments:: Pt denies hearing issues  Vision Screening - Comments:: Pt follows up with [facility] for annul eye exams    Goals Addressed             This Visit's Progress    Patient Stated        Maintain health and activity        Depression Screen    05/20/2023   11:51 AM 01/05/2023    9:03 AM 11/24/2022    9:18 AM 06/27/2022   10:33 AM 06/11/2022  1:56 PM 06/11/2022    1:28 PM 05/12/2022   11:22 AM  PHQ 2/9 Scores  PHQ - 2 Score 0 0 0 0 0 0 0    Fall Risk    05/20/2023   11:50 AM 01/05/2023    9:02 AM 11/24/2022    9:17 AM 06/27/2022   10:33 AM 06/11/2022    1:56 PM  Fall Risk   Falls in the past year? 0 0 0 0 0  Number falls in past yr: 0 0 0 0 0  Injury with Fall? 0 0 0 0 0  Risk for fall due to : Impaired balance/gait No Fall Risks No Fall Risks No Fall Risks No Fall Risks  Follow up Falls prevention discussed Falls evaluation completed Falls evaluation completed Falls evaluation completed Falls evaluation completed    MEDICARE RISK AT HOME: Medicare Risk at Home Any stairs in or around the home?: Yes If so, are there any without handrails?: No Home free of loose throw rugs in walkways, pet beds, electrical cords, etc?: Yes Adequate lighting in your home to reduce risk of falls?: Yes Life alert?: No Use of a cane, walker or w/c?: Yes Grab bars in the bathroom?: No Shower chair or bench in shower?: Yes Elevated toilet seat or a handicapped toilet?: No  TIMED UP AND GO:  Was the test performed?  No    Cognitive Function:        05/20/2023   11:53 AM 03/29/2021   11:11 AM 03/23/2020   11:09 AM  6CIT Screen  What Year? 0 points 0 points 0 points  What month? 0 points 0 points 0 points  What time? 0 points 0 points   Count back from 20 0 points 0 points 0 points  Months in reverse 0 points 0 points 0 points  Repeat phrase 0 points 0 points 0 points  Total Score 0 points 0 points     Immunizations Immunization History  Administered Date(s) Administered   Fluad Quad(high Dose 65+) 03/06/2016, 04/09/2017, 03/08/2019, 03/27/2020, 03/22/2021, 03/10/2022   Influenza, High Dose Seasonal PF 03/02/2012, 04/06/2013, 04/21/2014, 04/16/2015, 03/06/2016,  04/09/2017, 03/01/2018   Influenza-Unspecified 04/12/2011   PFIZER(Purple Top)SARS-COV-2 Vaccination 07/02/2019, 07/23/2019, 09/16/2019, 03/06/2020, 03/20/2021   Pfizer Covid-19 Vaccine Bivalent Booster 53yrs & up 03/20/2021   Pneumococcal Conjugate-13 10/05/2014   Pneumococcal Polysaccharide-23 04/23/2010, 11/12/2015, 07/01/2016, 07/13/2017, 01/11/2018, 09/09/2021   Pneumococcal-Unspecified 04/23/2010   Tdap 05/23/2010, 01/24/2022   Zoster Recombinant(Shingrix) 11/18/2017, 03/23/2018   Zoster, Live 06/09/2004    TDAP status: Up to date  Flu Vaccine status: Due, Education has been provided regarding the importance of this vaccine. Advised may receive this vaccine at local pharmacy or Health Dept. Aware to provide a copy of the vaccination record if obtained from local pharmacy or Health Dept. Verbalized acceptance and understanding.  Pneumococcal vaccine status: Up to date  Covid-19 vaccine status: Information provided on how to obtain vaccines.   Qualifies for Shingles Vaccine? Yes   Zostavax completed Yes   Shingrix Completed?: Yes  Screening Tests Health Maintenance  Topic Date Due   COVID-19 Vaccine (6 - 2023-24 season) 02/08/2023   INFLUENZA VACCINE  09/07/2023 (Originally 01/08/2023)   MAMMOGRAM  03/29/2024   Medicare Annual Wellness (AWV)  05/19/2024   DTaP/Tdap/Td (3 - Td or Tdap) 01/25/2032   Pneumonia Vaccine 19+ Years old  Completed   DEXA SCAN  Completed   Zoster Vaccines- Shingrix  Completed   HPV VACCINES  Aged Out    Health Maintenance  Health  Maintenance Due  Topic Date Due   COVID-19 Vaccine (6 - 2023-24 season) 02/08/2023    Colorectal cancer screening: No longer required.   Mammogram status: Completed 03/30/23. Repeat every year  Bone Density status: Completed 04/27/18. Results reflect: Bone density results: NORMAL. Repeat every 2 years.   Additional Screening:   Vision Screening: Recommended annual ophthalmology exams for early detection of  glaucoma and other disorders of the eye. Is the patient up to date with their annual eye exam?  Yes  Who is the provider or what is the name of the office in which the patient attends annual eye exams? [facility]  If pt is not established with a provider, would they like to be referred to a provider to establish care? No .   Dental Screening: Recommended annual dental exams for proper oral hygiene   Community Resource Referral / Chronic Care Management: CRR required this visit?  No   CCM required this visit?  No     Plan:     I have personally reviewed and noted the following in the patient's chart:   Medical and social history Use of alcohol, tobacco or illicit drugs  Current medications and supplements including opioid prescriptions. Patient is not currently taking opioid prescriptions. Functional ability and status Nutritional status Physical activity Advanced directives List of other physicians Hospitalizations, surgeries, and ER visits in previous 12 months Vitals Screenings to include cognitive, depression, and falls Referrals and appointments  In addition, I have reviewed and discussed with patient certain preventive protocols, quality metrics, and best practice recommendations. A written personalized care plan for preventive services as well as general preventive health recommendations were provided to patient.     Marzella Schlein, LPN   [id number]   After Visit Summary: (MyChart) Due to this being a telephonic visit, the after visit summary with patients personalized plan was offered to patient via MyChart   Nurse Notes: none

## 2023-05-20 NOTE — Patient Instructions (Signed)
Sabrina Ramirez , Thank you for taking time to come for your Medicare Wellness Visit. I appreciate your ongoing commitment to your health goals. Please review the following plan we discussed and let me know if I can assist you in the future.   Referrals/Orders/Follow-Ups/Clinician Recommendations: Aim for 30 minutes of exercise or brisk walking, 6-8 glasses of water, and 5 servings of fruits and vegetables each [place].   This is a list of the screening recommended for you and due dates:  Health Maintenance  Topic Date Due   Flu Shot  01/08/2023   COVID-19 Vaccine (6 - 2023-24 season) 02/08/2023   Medicare Annual Wellness Visit  04/25/2023   Mammogram  03/29/2024   DTaP/Tdap/Td vaccine (3 - Td or Tdap) 01/25/2032   Pneumonia Vaccine  Completed   DEXA scan (bone density measurement)  Completed   Zoster (Shingles) Vaccine  Completed   HPV Vaccine  Aged Out    Advanced directives: (In Chart) A copy of your advanced directives are scanned into your chart should your provider ever need it.  Next Medicare Annual Wellness Visit scheduled for next year: Yes

## 2023-07-08 ENCOUNTER — Ambulatory Visit: Payer: Medicare Other | Admitting: Family Medicine

## 2023-07-08 ENCOUNTER — Encounter: Payer: Self-pay | Admitting: Family Medicine

## 2023-07-08 VITALS — BP 188/67 | HR 58 | Temp 97.7°F | Ht 64.0 in | Wt 175.6 lb

## 2023-07-08 DIAGNOSIS — R7301 Impaired fasting glucose: Secondary | ICD-10-CM | POA: Diagnosis not present

## 2023-07-08 DIAGNOSIS — I10 Essential (primary) hypertension: Secondary | ICD-10-CM

## 2023-07-08 DIAGNOSIS — G250 Essential tremor: Secondary | ICD-10-CM

## 2023-07-08 DIAGNOSIS — E66811 Obesity, class 1: Secondary | ICD-10-CM

## 2023-07-08 DIAGNOSIS — J309 Allergic rhinitis, unspecified: Secondary | ICD-10-CM

## 2023-07-08 DIAGNOSIS — M1030 Gout due to renal impairment, unspecified site: Secondary | ICD-10-CM

## 2023-07-08 DIAGNOSIS — D631 Anemia in chronic kidney disease: Secondary | ICD-10-CM | POA: Diagnosis not present

## 2023-07-08 DIAGNOSIS — Z0000 Encounter for general adult medical examination without abnormal findings: Secondary | ICD-10-CM | POA: Diagnosis not present

## 2023-07-08 DIAGNOSIS — Z6830 Body mass index (BMI) 30.0-30.9, adult: Secondary | ICD-10-CM

## 2023-07-08 DIAGNOSIS — Z0001 Encounter for general adult medical examination with abnormal findings: Secondary | ICD-10-CM

## 2023-07-08 DIAGNOSIS — N184 Chronic kidney disease, stage 4 (severe): Secondary | ICD-10-CM | POA: Diagnosis not present

## 2023-07-08 DIAGNOSIS — Z79899 Other long term (current) drug therapy: Secondary | ICD-10-CM

## 2023-07-08 LAB — LIPID PANEL
Cholesterol: 154 mg/dL (ref 0–200)
HDL: 41.2 mg/dL (ref >39.00)
LDL Cholesterol: 65 mg/dL (ref 0–99)
NonHDL: 112.84
Total CHOL/HDL Ratio: 4
Triglycerides: 238 mg/dL — ABNORMAL HIGH (ref 0.0–149.0)
VLDL: 47.6 mg/dL — ABNORMAL HIGH (ref 0.0–40.0)

## 2023-07-08 LAB — COMPREHENSIVE METABOLIC PANEL
ALT: 15 U/L (ref 0–35)
AST: 19 U/L (ref 0–37)
Albumin: 4 g/dL (ref 3.5–5.2)
Alkaline Phosphatase: 85 U/L (ref 39–117)
BUN: 38 mg/dL — ABNORMAL HIGH (ref 6–23)
CO2: 22 meq/L (ref 19–32)
Calcium: 9.4 mg/dL (ref 8.4–10.5)
Chloride: 110 meq/L (ref 96–112)
Creatinine, Ser: 2.37 mg/dL — ABNORMAL HIGH (ref 0.40–1.20)
GFR: 18.67 mL/min — ABNORMAL LOW (ref >60.00)
Glucose, Bld: 114 mg/dL — ABNORMAL HIGH (ref 70–99)
Potassium: 4.2 meq/L (ref 3.5–5.1)
Sodium: 141 meq/L (ref 135–145)
Total Bilirubin: 0.5 mg/dL (ref 0.2–1.2)
Total Protein: 6.9 g/dL (ref 6.0–8.3)

## 2023-07-08 LAB — CBC WITH DIFFERENTIAL/PLATELET
Basophils Absolute: 0.1 10*3/uL (ref 0.0–0.1)
Basophils Relative: 0.7 % (ref 0.0–3.0)
Eosinophils Absolute: 0.5 10*3/uL (ref 0.0–0.7)
Eosinophils Relative: 6.6 % — ABNORMAL HIGH (ref 0.0–5.0)
HCT: 33.8 % — ABNORMAL LOW (ref 36.0–46.0)
Hemoglobin: 11.2 g/dL — ABNORMAL LOW (ref 12.0–15.0)
Lymphocytes Relative: 27 % (ref 12.0–46.0)
Lymphs Abs: 1.9 10*3/uL (ref 0.7–4.0)
MCHC: 33.2 g/dL (ref 30.0–36.0)
MCV: 98.8 fL (ref 78.0–100.0)
Monocytes Absolute: 0.6 10*3/uL (ref 0.1–1.0)
Monocytes Relative: 8.2 % (ref 3.0–12.0)
Neutro Abs: 4.1 10*3/uL (ref 1.4–7.7)
Neutrophils Relative %: 57.5 % (ref 43.0–77.0)
Platelets: 191 10*3/uL (ref 150.0–400.0)
RBC: 3.42 Mil/uL — ABNORMAL LOW (ref 3.87–5.11)
RDW: 14 % (ref 11.5–15.5)
WBC: 7.1 10*3/uL (ref 4.0–10.5)

## 2023-07-08 LAB — TSH: TSH: 2.48 u[IU]/mL (ref 0.35–5.50)

## 2023-07-08 LAB — HEMOGLOBIN A1C: Hgb A1c MFr Bld: 6.4 % (ref 4.6–6.5)

## 2023-07-08 LAB — URIC ACID: Uric Acid, Serum: 6.3 mg/dL (ref 2.4–7.0)

## 2023-07-08 MED ORDER — HYDRALAZINE HCL 10 MG PO TABS: 10.0000 mg | ORAL_TABLET | Freq: Three times a day (TID) | ORAL | 3 refills | Status: AC

## 2023-07-08 MED ORDER — METOPROLOL SUCCINATE ER 100 MG PO TB24: 100.0000 mg | ORAL_TABLET | Freq: Every day | ORAL | 3 refills | Status: AC

## 2023-07-08 NOTE — Progress Notes (Signed)
Subjective  Chief Complaint  Patient presents with   Annual Exam    Pt here for a=Annual Exam and is not currently fasting    Hypertension    HPI: Sabrina Ramirez is a 83 y.o. female who presents to [facility] [place] today for a Female Wellness Visit. She also has the concerns and/or needs as listed above in the chief complaint. These will be addressed in addition to the Health Maintenance Visit.   Wellness Visit: annual visit with health maintenance review and exam  HM: mammo current. No longer needs DEXA (2 normals) nor CRC screen. Doing well overall. Imms up to date. Remains active and independent Chronic disease f/u and/or acute problem visit: (deemed necessary to be done in addition to the wellness visit): Discussed the use of AI scribe software for clinical note transcription with the patient, who gave verbal consent to proceed.  History of Present Illness   The patient presents with elevated blood pressure, concerns about exercise, and hair loss.  The patient has elevated blood pressure readings, particularly during stressful situations such as family dinners on Tuesdays. She has not been monitoring her blood pressure regularly at home. She is currently taking hydralazine and metoprolol but missed her dose this morning due to an empty pill organizer and rushing. Her blood pressure was high during today's visit, but it was better during a previous visit in November. Reviewed recent renal notes: bp was better: 139/70 in November on meds. No cp or sob.   She experiences difficulty with exercise due to pain when standing or walking for extended periods. She acknowledges not doing her exercises and expresses a need to resume them to improve her condition.  She is concerned about hair loss, noting that her sister experienced noticeable hair thickening after using a particular product. She is considering trying the same product and has discussed this with her  daughter.  No chest pain, palpitations, shortness of breath, or leg swelling. She reports nasal drainage in the mornings and dryness due to heating.       Assessment  1. Encounter for well adult exam with abnormal findings   2. Essential hypertension   3. Chronic kidney disease, stage 4 (severe) (HCC)   4. Anemia associated with stage 4 chronic renal failure (HCC)   5. IFG (impaired fasting glucose)   6. Obesity, Class I, BMI 30-34.9   7. Chronic allergic rhinitis   8. Essential tremor   9. Gout due to renal impairment, unspecified chronicity, unspecified site   10. On statin therapy due to risk of future cardiovascular event      Plan  Female Wellness Visit: Age appropriate Health Maintenance and Prevention measures were discussed with patient. Included topics are cancer screening recommendations, ways to keep healthy (see AVS) including dietary and exercise recommendations, regular eye and dental care, use of seat belts, and avoidance of moderate alcohol use and tobacco use. Current screens.  BMI: discussed patient's BMI and encouraged positive lifestyle modifications to help get to or maintain a target BMI. HM needs and immunizations were addressed and ordered. See below for orders. See HM and immunization section for updates. Routine labs and screening tests ordered including cmp, cbc and lipids where appropriate. Discussed recommendations regarding Vit D and calcium supplementation (see AVS)  Chronic disease management visit and/or acute problem visit: HTN: elevated mildly today due to lack of taking meds and rushing. Will monitor at home and send in readings. Continue toprol xl and hydralazine.  To advanced HTN clinic if not controlled since has many allergies to bp meds. Check renal function and electrolytes. Monitor renal function and anemia H/o IFG: no sxs now. Recheck  Check uric acid levels. No gout flares. Not on preventives.  On statin and tolerating it.   Follow up: 6  mo for recheck  Orders Placed This Encounter  Procedures   CBC with Differential/Platelet   Comprehensive metabolic panel   Lipid panel   Hemoglobin A1c   TSH   Uric acid   Iron, TIBC and Ferritin Panel   No orders of the defined types were placed in this encounter.     Body mass index is 30.14 kg/m. Wt Readings from Last 3 Encounters:  07/08/23 175 lb 9.6 oz (79.7 kg)  05/20/23 176 lb (79.8 kg)  01/05/23 176 lb 12.8 oz (80.2 kg)     Patient Active Problem List   Diagnosis Date Noted Date Diagnosed   Anemia associated with stage 4 chronic renal failure (HCC) 09/25/2021     Priority: High   Chronic kidney disease, stage 4 (severe) (HCC) 09/24/2021     Priority: High    Nl SPEP,UPEP and mild cortical thinning on renal ultrasound 2014 [facility] 2023    IFG (impaired fasting glucose) 03/09/2019     Priority: High    a1c 6.3 02/2019. 6.2 06/2022    Obesity, Class I, BMI 30-34.9 04/16/2015     Priority: High   Hypertriglyceridemia 09/22/2011     Priority: High   Essential hypertension 05/23/2010     Priority: High    She has had difficulties with multiple antihypertensives including ACE, ARB, diuretics. She had what she noted to be intolerable swelling with amlodipine.  Treated with metoprolol and hydralazine    Gout due to renal impairment 05/12/2022     Priority: Medium    Nephrolithiasis 03/22/2021     Priority: Medium     Right ureteral stone; [facility] 2022    Localized primary osteoarthritis of carpometacarpal joint of left thumb 03/08/2019     Priority: Medium    On statin therapy due to risk of future cardiovascular event 12/01/2017     Priority: Medium    Spondylolisthesis at L5-S1 level 08/13/2017     Priority: Medium    Colon polyp 05/25/2017     Priority: Medium     Overview:  1968, none on subsequent colonoscopy, [facility], last 2012    Ophthalmic migraine 05/25/2017     Priority: Medium    Essential tremor 05/25/2017     Priority: Medium     Allergic asthma, mild persistent, uncomplicated 05/25/2017     Priority: Medium     Dr. [place] Callas, allergist    Irritable bowel syndrome with diarrhea 07/08/2010     Priority: Medium    Chronic allergic rhinitis 02/25/2010     Priority: Medium    GERD (gastroesophageal reflux disease) 02/25/2010     Priority: Medium     Overview:  On chronic PPI since early 2000s. Neg H.pylor    Osteoarthritis, multiple sites 06/12/2009     Priority: Medium    Female pattern hair loss 11/24/2022     Priority: Low   Sensorineural hearing loss (SNHL) of both ears 06/27/2022     Priority: Low   Allergic rhinitis due to pollen 01/24/2022 01/24/2022    Priority: Low   Conjunctivitis, allergic, chronic 10/01/2012     Priority: Low   Rosacea 06/25/2011     Priority: Low   Psoriasis  02/25/2010     Priority: Low   Health Maintenance  Topic Date Due   COVID-19 Vaccine (6 - 2024-25 season) 07/24/2023 (Originally 02/08/2023)   INFLUENZA VACCINE  09/07/2023 (Originally 01/08/2023)   MAMMOGRAM  03/29/2024   Medicare Annual Wellness (AWV)  05/19/2024   DTaP/Tdap/Td (3 - Td or Tdap) 01/25/2032   Pneumonia Vaccine 57+ Years old  Completed   DEXA SCAN  Completed   Zoster Vaccines- Shingrix  Completed   HPV VACCINES  Aged Out   Immunization History  Administered Date(s) Administered   Fluad Quad(high Dose 65+) 03/06/2016, 04/09/2017, 03/08/2019, 03/27/2020, 03/22/2021, 03/10/2022   Influenza, High Dose Seasonal PF 03/02/2012, 04/06/2013, 04/21/2014, 04/16/2015, 03/06/2016, 04/09/2017, 03/01/2018   Influenza-Unspecified 04/12/2011   PFIZER(Purple Top)SARS-COV-2 Vaccination 07/02/2019, 07/23/2019, 09/16/2019, 03/06/2020, 03/20/2021   Pfizer Covid-19 Vaccine Bivalent Booster 50yrs & up 03/20/2021   Pneumococcal Conjugate-13 10/05/2014   Pneumococcal Polysaccharide-23 04/23/2010, 11/12/2015, 07/01/2016, 07/13/2017, 01/11/2018, 09/09/2021   Pneumococcal-Unspecified 04/23/2010   Tdap 05/23/2010, 01/24/2022    Zoster Recombinant(Shingrix) 11/18/2017, 03/23/2018   Zoster, Live 06/09/2004   We updated and reviewed the patient's past history in detail and it is documented below. Allergies: Patient is allergic to amlodipine, flexeril [cyclobenzaprine], chlor-trimeton [chlorpheniramine], ace inhibitors, adhesive [tape], angiotensin receptor blockers, corn-containing products, iodine, and triamterene-hctz. Past Medical History Patient  has a past medical history of Allergic asthma, mild intermittent, uncomplicated (05/25/2017), Arthritis, Chronic kidney disease, GERD (gastroesophageal reflux disease), Gout flare (01/17/2022), Hemorrhoids, History of kidney stones, Hypertension, IBS (irritable bowel syndrome), IFG (impaired fasting glucose) (03/09/2019), Nephrolithiasis (03/22/2021), and Psoriasis. Past Surgical History Patient  has a past surgical history that includes Cholecystectomy; Hemorrhoid surgery; Cataract extraction w/ intraocular lens  implant, bilateral (Bilateral, 2015); Abdominal hysterectomy (1987); Tonsillectomy; and Spine surgery (07/10/2017). Family History: Patient family history includes Arthritis in her maternal aunt; COPD in her maternal aunt and maternal grandmother; Deep vein thrombosis in her maternal grandfather and paternal grandmother; Diabetes in her father, paternal uncle, and sister; Heart attack in her father; Heart disease in her father, maternal grandmother, and paternal uncle; Hyperlipidemia in her father; Hypertension in her daughter, father, maternal aunt, paternal grandmother, and sister; Stroke in her father, maternal grandfather, mother, and paternal uncle; Transient ischemic attack in her sister. Social History:  Patient  reports that she has never smoked. She has never used smokeless tobacco. She reports current alcohol use. She reports that she does not use drugs.  Review of Systems: Constitutional: negative for fever or malaise Ophthalmic: negative for photophobia,  double vision or loss of vision Cardiovascular: negative for chest pain, dyspnea on exertion, or new LE swelling Respiratory: negative for SOB or persistent cough Gastrointestinal: negative for abdominal pain, change in bowel habits or melena Genitourinary: negative for dysuria or gross hematuria, no abnormal uterine bleeding or disharge Musculoskeletal: negative for new gait disturbance or muscular weakness Integumentary: negative for new or persistent rashes, no breast lumps Neurological: negative for TIA or stroke symptoms Psychiatric: negative for SI or delusions Allergic/Immunologic: negative for hives  Patient Care Team    Relationship Specialty Notifications Start End  Willow Ora, MD PCP - General Family Medicine  05/25/17   Sidney Ace, MD Referring Physician Allergy  05/25/17   Lisbeth Renshaw, MD Consulting Physician Neurosurgery  10/28/17   [place], [place]  Dentistry  10/28/17   Glyn Ade, PA-C Physician Assistant Dermatology  09/18/21   Darnell Level, MD Consulting Physician Nephrology  12/25/21     Objective  Vitals: BP (!) 188/67   Pulse Marland Kitchen)  58   Temp 97.7 F (36.5 C)   Ht 5\' 4"  (1.626 m)   Wt 175 lb 9.6 oz (79.7 kg)   SpO2 98%   BMI 30.14 kg/m  General:  Well developed, well nourished, no acute distress  Psych:  Alert and orientedx3,normal mood and affect HEENT:  Normocephalic, atraumatic, non-icteric sclera,  supple neck without adenopathy, mass or thyromegaly Cardiovascular:  Normal S1, S2, RRR without gallop, rub or murmur Respiratory:  Good breath sounds bilaterally, CTAB with normal respiratory effort Gastrointestinal: normal bowel sounds, soft, non-tender, no noted masses. No HSM MSK: extremities without edema, joints without erythema or swelling Neurologic:    Mental status is normal.  Gross motor and sensory exams are normal.    Commons side effects, risks, benefits, and alternatives for medications and treatment plan prescribed  today were discussed, and the patient expressed understanding of the given instructions. Patient is instructed to call or message via MyChart if he/she has any questions or concerns regarding our treatment plan. No barriers to understanding were identified. We discussed Red Flag symptoms and signs in detail. Patient expressed understanding regarding what to do in case of urgent or emergency type symptoms.  Medication list was reconciled, printed and provided to the patient in AVS. Patient instructions and summary information was reviewed with the patient as documented in the AVS. This note was prepared with assistance of Dragon voice recognition software. Occasional wrong-word or sound-a-like substitutions may have occurred due to the inherent limitations of voice recognition software

## 2023-07-08 NOTE — Patient Instructions (Addendum)
Please return in 6 months for hypertension follow up.   I will release your lab results to you on your MyChart account with further instructions. You may see the results before I do, but when I review them I will send you a message with my report or have my assistant call you if things need to be discussed. Please reply to my message with any questions. Thank you!   If you have any questions or concerns, please don't hesitate to send me a message via MyChart or call the office at [phone]. Thank you for visiting with [place] today! It's our pleasure caring for you.   VISIT SUMMARY:  During today's visit, we discussed your elevated blood pressure, concerns about exercise, and hair loss. We reviewed your diagnosis related to kidney disease, anemia, high cholesterol, allergies, back pain and GERD. Your blood pressure was high today, likely due to stress and missing your morning medication. We also talked about your difficulty with exercise and your concerns about hair loss.  YOUR PLAN:  -HYPERTENSION: Hypertension means high blood pressure, which can lead to serious health problems if not managed. You should resume your regular medication regimen with Hydralazine and Metoprolol, and start monitoring your blood pressure at home. Please report your readings via MyChart.  -GENERAL HEALTH MAINTENANCE: We reviewed your overall health, and there are no current complaints of chest pain, palpitations, shortness of breath, or leg swelling. We will do comprehensive blood work to check your sugar levels, kidney function, cholesterol, and thyroid. Your prescriptions for Hydralazine and Metoprolol have been refilled. Please schedule a follow-up appointment in six months.  INSTRUCTIONS:  Please monitor your blood pressure at home and report the readings via MyChart. Schedule a follow-up appointment in six months.

## 2023-07-09 LAB — IRON,TIBC AND FERRITIN PANEL
%SAT: 27 % (ref 16–45)
Ferritin: 55 ng/mL (ref 16–288)
Iron: 81 ug/dL (ref 45–160)
TIBC: 297 ug/dL (ref 250–450)

## 2023-07-13 ENCOUNTER — Encounter: Payer: Self-pay | Admitting: Family Medicine

## 2023-07-13 NOTE — Progress Notes (Signed)
See mychart note Dear Ms. Pajak, Your lab results are all stable. Your sugar test remains elevated in the near diabetic range, so eat well and we will keep an eye on it together. Your kidney disease shows slight progression. Monitor your blood pressures at home to be sure they are controlled. This is important to help protect the kidneys. Let me know your numbers.   Sincerely, Dr. Mardelle Matte

## 2023-07-27 ENCOUNTER — Other Ambulatory Visit: Payer: Self-pay | Admitting: Family Medicine

## 2023-08-23 ENCOUNTER — Other Ambulatory Visit: Payer: Self-pay | Admitting: Family Medicine

## 2023-11-24 ENCOUNTER — Other Ambulatory Visit: Payer: Self-pay | Admitting: Family Medicine

## 2023-11-29 ENCOUNTER — Other Ambulatory Visit: Payer: Self-pay | Admitting: Family Medicine

## 2024-01-06 ENCOUNTER — Encounter: Payer: Self-pay | Admitting: Family Medicine

## 2024-01-06 ENCOUNTER — Ambulatory Visit: Payer: Medicare Other | Admitting: Family Medicine

## 2024-01-06 VITALS — BP 132/64 | HR 87 | Temp 97.7°F | Ht 64.0 in | Wt 172.0 lb

## 2024-01-06 DIAGNOSIS — I10 Essential (primary) hypertension: Secondary | ICD-10-CM | POA: Diagnosis not present

## 2024-01-06 DIAGNOSIS — M4317 Spondylolisthesis, lumbosacral region: Secondary | ICD-10-CM

## 2024-01-06 DIAGNOSIS — R7301 Impaired fasting glucose: Secondary | ICD-10-CM | POA: Diagnosis not present

## 2024-01-06 DIAGNOSIS — H6192 Disorder of left external ear, unspecified: Secondary | ICD-10-CM

## 2024-01-06 DIAGNOSIS — N184 Chronic kidney disease, stage 4 (severe): Secondary | ICD-10-CM | POA: Diagnosis not present

## 2024-01-06 DIAGNOSIS — K580 Irritable bowel syndrome with diarrhea: Secondary | ICD-10-CM

## 2024-01-06 LAB — POCT GLYCOSYLATED HEMOGLOBIN (HGB A1C): Hemoglobin A1C: 6 % — AB (ref 4.0–5.6)

## 2024-01-06 NOTE — Progress Notes (Signed)
 Subjective  CC:  Chief Complaint  Patient presents with   Hypertension    HPI: Sabrina Ramirez is a 83 y.o. female who presents to the office today to address the problems listed above in the chief complaint. Hypertension f/u: Blood pressure is mildly elevated in the office today, however patient was not feeling well yesterday.  Had an acute GI illness with nausea with vomiting and diarrhea that lasted about 6 hours.  Fortunately no persistent vomiting or diarrhea today but she feels a little shaky and worn out.  No fevers.  No abdominal pain.  No blood in the stool.  No mucus in the stool. She has not been able to check her home blood pressures but she did buy a new monitor.  She needs to get batteries for it.  I reviewed recent nephrology notes were blood pressure was controlled at 132/64 on June 2.  She takes hydralazine  and metoprolol .  She denies chest pain or palpitations.  No lower extremity edema.  She has sensitivities to multiple antihypertensives. Impaired fasting glucose: She has been working hard on improving her diet.  Weight is down a few pounds.  Here for recheck on A1c today.  Last A1c was 6.4. IBS: Overall much improved after FODMAP diet. Low back pain continues to be limiting.  Cannot stand or walk for long periods of time.  Continues to use a cane. Has a red flaky lesion on left ear that has been there for several months.  Assessment  1. Essential hypertension   2. IFG (impaired fasting glucose)   3. Chronic kidney disease, stage 4 (severe) (HCC)   4. Irritable bowel syndrome with diarrhea   5. Spondylolisthesis at L5-S1 level   6. Skin lesion of left ear      Plan   Hypertension f/u: BP control is fairly well controlled.  Will continue metoprolol  and hydralazine  and recheck in 6 months.  Patient will start home monitoring. Impaired fasting glucose: Improved.  A1c 6.0 today.  Continue healthy diet. Chronic kidney disease is stable fortunately. IBS is  improved Refer to dermatology to look at skin lesion of left ear: Given flaking and persistence, question AK or other cancerous lesion.   Education regarding management of these chronic disease states was given. Management strategies discussed on successive visits include dietary and exercise recommendations, goals of achieving and maintaining IBW, and lifestyle modifications aiming for adequate sleep and minimizing stressors.   Follow up: 6 mo for cpe  Orders Placed This Encounter  Procedures   Ambulatory referral to Dermatology   POCT HgB A1C   No orders of the defined types were placed in this encounter.     BP Readings from Last 3 Encounters:  01/06/24 132/64  07/08/23 (!) 188/67  01/05/23 138/60   Wt Readings from Last 3 Encounters:  01/06/24 172 lb (78 kg)  07/08/23 175 lb 9.6 oz (79.7 kg)  05/20/23 176 lb (79.8 kg)    Lab Results  Component Value Date   CHOL 154 07/08/2023   CHOL 116 03/22/2021   CHOL 137 03/27/2020   Lab Results  Component Value Date   HDL 41.20 07/08/2023   HDL 42.70 03/22/2021   HDL 43 (L) 03/27/2020   Lab Results  Component Value Date   LDLCALC 65 07/08/2023   LDLCALC 46 03/22/2021   LDLCALC 67 03/27/2020   Lab Results  Component Value Date   TRIG 238.0 (H) 07/08/2023   TRIG 138.0 03/22/2021   TRIG 199 (H) 03/27/2020  Lab Results  Component Value Date   CHOLHDL 4 07/08/2023   CHOLHDL 3 03/22/2021   CHOLHDL 3.2 03/27/2020   No results found for: LDLDIRECT Lab Results  Component Value Date   CREATININE 2.37 (H) 07/08/2023   BUN 38 (H) 07/08/2023   NA 141 07/08/2023   K 4.2 07/08/2023   CL 110 07/08/2023   CO2 22 07/08/2023    The ASCVD Risk score (Arnett DK, et al., 2019) failed to calculate for the following reasons:   The 2019 ASCVD risk score is only valid for ages 68 to 70  I reviewed the patients updated PMH, FH, and SocHx.    Patient Active Problem List   Diagnosis Date Noted   Anemia associated with stage  4 chronic renal failure (HCC) 09/25/2021    Priority: High   Chronic kidney disease, stage 4 (severe) (HCC) 09/24/2021    Priority: High   IFG (impaired fasting glucose) 03/09/2019    Priority: High   Obesity, Class I, BMI 30-34.9 04/16/2015    Priority: High   Hypertriglyceridemia 09/22/2011    Priority: High   Essential hypertension 05/23/2010    Priority: High   Gout due to renal impairment 05/12/2022    Priority: Medium    Nephrolithiasis 03/22/2021    Priority: Medium    Localized primary osteoarthritis of carpometacarpal joint of left thumb 03/08/2019    Priority: Medium    On statin therapy due to risk of future cardiovascular event 12/01/2017    Priority: Medium    Spondylolisthesis at L5-S1 level 08/13/2017    Priority: Medium    Colon polyp 05/25/2017    Priority: Medium    Ophthalmic migraine 05/25/2017    Priority: Medium    Essential tremor 05/25/2017    Priority: Medium    Allergic asthma, mild persistent, uncomplicated 05/25/2017    Priority: Medium    Irritable bowel syndrome with diarrhea 07/08/2010    Priority: Medium    Chronic allergic rhinitis 02/25/2010    Priority: Medium    GERD (gastroesophageal reflux disease) 02/25/2010    Priority: Medium    Osteoarthritis, multiple sites 06/12/2009    Priority: Medium    Female pattern hair loss 11/24/2022    Priority: Low   Sensorineural hearing loss (SNHL) of both ears 06/27/2022    Priority: Low   Allergic rhinitis due to pollen 01/24/2022    Priority: Low   Conjunctivitis, allergic, chronic 10/01/2012    Priority: Low   Rosacea 06/25/2011    Priority: Low   Psoriasis 02/25/2010    Priority: Low    Allergies: Amlodipine, Flexeril [cyclobenzaprine], Chlor-trimeton [chlorpheniramine], Ace inhibitors, Adhesive [tape], Angiotensin receptor blockers, Corn-containing products, Iodine, and Triamterene-hctz  Social History: Patient  reports that she has never smoked. She has never used smokeless tobacco.  She reports current alcohol use. She reports that she does not use drugs.  Current Meds  Medication Sig   Acetaminophen  (TYLENOL  PO) Take by mouth daily as needed.   Albuterol  Sulfate (PROAIR  RESPICLICK) 108 (90 Base) MCG/ACT AEPB Inhale 1-2 puffs into the lungs every 6 (six) hours as needed (for wheezing/shortness of breath).    allopurinol (ZYLOPRIM) 100 MG tablet Take 100 mg by mouth daily.   aspirin  EC 81 MG tablet Take 1 tablet (81 mg total) by mouth at bedtime.   azelastine (ASTELIN) 0.1 % nasal spray Place into both nostrils 2 (two) times daily. Use in each nostril as directed   ciclopirox  (PENLAC ) 8 % solution Apply topically at bedtime.  Apply over nail and surrounding skin. Apply daily over previous coat. After seven (7) days, may remove with alcohol and continue cycle.   fluocinonide  cream (LIDEX ) 0.05 % APPLY  CREAM EXTERNALLY TO AFFECTED AREA THREE TIMES DAILY FOR PSORIASIS FLARES. DO NOT USE LONGER THAN 2 WEEKS   fluticasone  furoate-vilanterol (BREO ELLIPTA ) 200-25 MCG/INH AEPB Inhale 1 puff into the lungs daily.   Glucosamine-Chondroit-Vit C-Mn (GLUCOSAMINE 1500 COMPLEX) CAPS Take by mouth.   hydrALAZINE  (APRESOLINE ) 10 MG tablet Take 1 tablet (10 mg total) by mouth 3 (three) times daily.   loperamide (IMODIUM A-D) 2 MG tablet Take 2 mg by mouth 4 (four) times daily as needed for diarrhea or loose stools.   metoprolol  succinate (TOPROL -XL) 100 MG 24 hr tablet Take 1 tablet (100 mg total) by mouth daily.   montelukast  (SINGULAIR ) 10 MG tablet Take 10 mg by mouth at bedtime.    Multiple Vitamin (MULTIVITAMIN) tablet Take 1 tablet by mouth daily. Senior Multivitamin   nystatin  powder Apply 1 Application topically 3 (three) times daily.   omeprazole (PRILOSEC OTC) 20 MG tablet Take 20 mg by mouth daily.   OVER THE COUNTER MEDICATION Systane Ultra Eyedrops   rosuvastatin  (CRESTOR ) 5 MG tablet TAKE 1/2 (ONE-HALF) TABLET BY MOUTH IN THE EVENING   triamcinolone  cream (KENALOG ) 0.1 % Apply  1 application topically 2 (two) times daily as needed (for skin rash (Summertime)).     Review of Systems: Cardiovascular: negative for chest pain, palpitations, leg swelling, orthopnea Respiratory: negative for SOB, wheezing or persistent cough Gastrointestinal: negative for abdominal pain Genitourinary: negative for dysuria or gross hematuria  Objective  Vitals: BP 132/64 Comment: 11/09/2023 at [facility]  Pulse 87   Temp 97.7 F (36.5 C)   Ht 5' 4 (1.626 m)   Wt 172 lb (78 kg)   SpO2 96%   BMI 29.52 kg/m  General: no acute distress  Psych:  Alert and oriented, normal mood and affect HEENT:  Normocephalic, atraumatic, supple neck, left external ear with erythematous flaky approximately 4 mm oval lesion. Cardiovascular:  RRR without murmur. no edema Respiratory:  Good breath sounds bilaterally, CTAB with normal respiratory effort Skin:  Warm, no rashes Neurologic:   Mental status is normal  Lab Results  Component Value Date   HGBA1C 6.0 (A) 01/06/2024   HGBA1C 6.4 07/08/2023   HGBA1C 6.2 06/27/2022     Commons side effects, risks, benefits, and alternatives for medications and treatment plan prescribed today were discussed, and the patient expressed understanding of the given instructions. Patient is instructed to call or message via MyChart if he/she has any questions or concerns regarding our treatment plan. No barriers to understanding were identified. We discussed Red Flag symptoms and signs in detail. Patient expressed understanding regarding what to do in case of urgent or emergency type symptoms.  Medication list was reconciled, printed and provided to the patient in AVS. Patient instructions and summary information was reviewed with the patient as documented in the AVS. This note was prepared with assistance of Dragon voice recognition software. Occasional wrong-word or sound-a-like substitutions may have occurred due to the inherent limitation

## 2024-01-06 NOTE — Patient Instructions (Signed)
Please return in 6 months for your annual complete physical; please come fasting.   If you have any questions or concerns, please don't hesitate to send me a message via MyChart or call the office at [phone]. Thank you for visiting with us today! It's our pleasure caring for you.  

## 2024-02-01 ENCOUNTER — Other Ambulatory Visit: Payer: Self-pay | Admitting: Family Medicine

## 2024-03-07 ENCOUNTER — Encounter: Payer: Self-pay | Admitting: Dermatology

## 2024-03-07 ENCOUNTER — Ambulatory Visit: Admitting: Dermatology

## 2024-03-07 VITALS — BP 96/85 | HR 76

## 2024-03-07 DIAGNOSIS — D485 Neoplasm of uncertain behavior of skin: Secondary | ICD-10-CM

## 2024-03-07 DIAGNOSIS — H61002 Unspecified perichondritis of left external ear: Secondary | ICD-10-CM | POA: Diagnosis not present

## 2024-03-07 NOTE — Progress Notes (Signed)
   New Patient Visit   Subjective  Sabrina Ramirez is a 83 y.o. female who presents for the following: spot of concern. Lesion present for several months. Tender and red, not previously treated. No history of NMSC.   The following portions of the chart were reviewed this encounter and updated as appropriate: medications, allergies, medical history  Review of Systems:  No other skin or systemic complaints except as noted in HPI or Assessment and Plan.  Objective  Well appearing patient in no apparent distress; mood and affect are within normal limits.  s unless otherwise noted below.  A focused examination was performed of the following areas: Left ear  Relevant exam findings are noted in the Assessment and Plan.  Left Ear 5mm pink scaly papule    Assessment & Plan   NEOPLASM OF UNCERTAIN BEHAVIOR OF SKIN Left Ear Skin / nail biopsy Type of biopsy: tangential   Informed consent: discussed and consent obtained   Timeout: patient name, date of birth, surgical site, and procedure verified   Procedure prep:  Patient was prepped and draped in usual sterile fashion Prep type:  Isopropyl alcohol Anesthesia: the lesion was anesthetized in a standard fashion   Anesthetic:  1% lidocaine  w/ epinephrine  1-100,000 buffered w/ 8.4% NaHCO3 Instrument used: DermaBlade   Hemostasis achieved with: aluminum chloride   Outcome: patient tolerated procedure well   Post-procedure details: sterile dressing applied and wound care instructions given   Dressing type: petrolatum gauze and bandage    Specimen 1 - Surgical pathology Differential Diagnosis: r/o nmsc vs CNH  Check Margins: No   Return for tbsc when ready with brenda .  I, Berwyn Lesches, Surg Tech III, am acting as scribe for RUFUS CHRISTELLA HOLY, MD.   Documentation: I have reviewed the above documentation for accuracy and completeness, and I agree with the above.  RUFUS CHRISTELLA HOLY, MD

## 2024-03-07 NOTE — Patient Instructions (Signed)
 Patient Handout: Wound Care for Skin Biopsy Site  Taking Care of Your Skin Biopsy Site  Proper care of the biopsy site is essential for promoting healing and minimizing scarring. This handout provides instructions on how to care for your biopsy site to ensure optimal recovery.  1. Cleaning the Wound:  Clean the biopsy site daily with gentle soap and water. Gently pat the area dry with a clean, soft towel. Avoid harsh scrubbing or rubbing the area, as this can irritate the skin and delay healing.  2. Applying Aquaphor and Bandage:  After cleaning the wound, apply a thin layer of Aquaphor ointment to the biopsy site. Cover the area with a sterile bandage to protect it from dirt, bacteria, and friction. Change the bandage daily or as needed if it becomes soiled or wet.  3. Continued Care for One Week:  Repeat the cleaning, Aquaphor application, and bandaging process daily for one week following the biopsy procedure. Keeping the wound clean and moist during this initial healing period will help prevent infection and promote optimal healing.  4. Massaging Aquaphor into the Area:  ---After one week, discontinue the use of bandages but continue to apply Aquaphor to the biopsy site. ----Gently massage the Aquaphor into the area using circular motions. ---Massaging the skin helps to promote circulation and prevent the formation of scar tissue.   Additional Tips:  Avoid exposing the biopsy site to direct sunlight during the healing process, as this can cause hyperpigmentation or worsen scarring. If you experience any signs of infection, such as increased redness, swelling, warmth, or drainage from the wound, contact your healthcare provider immediately. Follow any additional instructions provided by your healthcare provider for caring for the biopsy site and managing any discomfort. Conclusion:  Taking proper care of your skin biopsy site is crucial for ensuring optimal healing and  minimizing scarring. By following these instructions for cleaning, applying Aquaphor, and massaging the area, you can promote a smooth and successful recovery. If you have any questions or concerns about caring for your biopsy site, don't hesitate to contact your healthcare provider for guidance.   Important Information  Due to recent changes in healthcare laws, you may see results of your pathology and/or laboratory studies on MyChart before the doctors have had a chance to review them. We understand that in some cases there may be results that are confusing or concerning to you. Please understand that not all results are received at the same time and often the doctors may need to interpret multiple results in order to provide you with the best plan of care or course of treatment. Therefore, we ask that you please give [place] 2 business days to thoroughly review all your results before contacting the office for clarification. Should we see a critical lab result, you will be contacted sooner.   If You Need Anything After Your Visit  If you have any questions or concerns for your doctor, please call our main line at [phone] If no one answers, please leave a voicemail as directed and we will return your call as soon as possible. Messages left after 4 pm will be answered the following business day.   You may also send [place] a message via MyChart. We typically respond to MyChart messages within 1-2 business days.  For prescription refills, please ask your pharmacy to contact our office. Our fax number is [phone].  If you have an urgent issue when the clinic is closed that cannot wait until the next business day,  you can page your doctor at the number below.    Please note that while we do our best to be available for urgent issues outside of office hours, we are not available 24/7.   If you have an urgent issue and are unable to reach [place], you may choose to seek medical care at your doctor's office,  retail clinic, urgent care center, or emergency room.  If you have a medical emergency, please immediately call 911 or go to the emergency department. In the event of inclement weather, please call our main line at [phone] for an update on the status of any delays or closures.  Dermatology Medication Tips: Please keep the boxes that topical medications come in in order to help keep track of the instructions about where and how to use these. Pharmacies typically print the medication instructions only on the boxes and not directly on the medication tubes.   If your medication is too expensive, please contact our office at [phone] or send [place] a message through MyChart.   We are unable to tell what your co-pay for medications will be in advance as this is different depending on your insurance coverage. However, we may be able to find a substitute medication at lower cost or fill out paperwork to get insurance to cover a needed medication.   If a prior authorization is required to get your medication covered by your insurance company, please allow [place] 1-2 business days to complete this process.  Drug prices often vary depending on where the prescription is filled and some pharmacies may offer cheaper prices.  The website www.goodrx.com contains coupons for medications through different pharmacies. The prices here do not account for what the cost may be with help from insurance (it may be cheaper with your insurance), but the website can give you the price if you did not use any insurance.  - You can print the associated coupon and take it with your prescription to the pharmacy.  - You may also stop by our office during regular business hours and pick up a GoodRx coupon card.  - If you need your prescription sent electronically to a different pharmacy, notify our office through [facility] or by phone at [phone]    Skin Education :   I counseled the patient regarding the  following: Sun screen (SPF 30 or greater) should be applied during peak UV exposure (between 10am and 2pm) and reapplied after exercise or swimming.  The ABCDEs of melanoma were reviewed with the patient, and the importance of monthly self-examination of moles was emphasized. Should any moles change in shape or color, or itch, bleed or burn, pt will contact our office for evaluation sooner then their interval appointment.  Plan: Sunscreen Recommendations I recommended a broad spectrum sunscreen with a SPF of 30 or higher. I explained that SPF 30 sunscreens block approximately 97 percent of the sun's harmful rays. Sunscreens should be applied at least 15 minutes prior to expected sun exposure and then every 2 hours after that as long as sun exposure continues. If swimming or exercising sunscreen should be reapplied every 45 minutes to an hour after getting wet or sweating. One ounce, or the equivalent of a shot glass full of sunscreen, is adequate to protect the skin not covered by a bathing suit. I also recommended a lip balm with a sunscreen as well. Sun protective clothing can be used in lieu of sunscreen but must be worn the entire time you are exposed  to the sun's rays.

## 2024-03-08 LAB — SURGICAL PATHOLOGY

## 2024-03-09 ENCOUNTER — Ambulatory Visit: Payer: Self-pay | Admitting: Dermatology

## 2024-03-23 ENCOUNTER — Ambulatory Visit (INDEPENDENT_AMBULATORY_CARE_PROVIDER_SITE_OTHER)

## 2024-03-23 VITALS — Ht 64.0 in | Wt 170.0 lb

## 2024-03-23 DIAGNOSIS — Z0000 Encounter for general adult medical examination without abnormal findings: Secondary | ICD-10-CM | POA: Diagnosis not present

## 2024-03-23 NOTE — Patient Instructions (Signed)
 Sabrina Ramirez,  Thank you for taking the time for your Medicare Wellness Visit. I appreciate your continued commitment to your health goals. Please review the care plan we discussed, and feel free to reach out if I can assist you further.  Medicare recommends these wellness visits once per year to help you and your care team stay ahead of potential health issues. These visits are designed to focus on prevention, allowing your provider to concentrate on managing your acute and chronic conditions during your regular appointments.  Please note that Annual Wellness Visits do not include a physical exam. Some assessments may be limited, especially if the visit was conducted virtually. If needed, we may recommend a separate in-person follow-up with your provider.  Ongoing Care Seeing your primary care provider every 3 to 6 months helps us  monitor your health and provide consistent, personalized care.   Referrals If a referral was made during today's visit and you haven't received any updates within two weeks, please contact the referred provider directly to check on the status.  Recommended Screenings:  Health Maintenance  Topic Date Due   Flu Shot  01/08/2024   COVID-19 Vaccine (6 - 2025-26 season) 02/08/2024   Breast Cancer Screening  03/29/2024   Medicare Annual Wellness Visit  05/19/2024   DTaP/Tdap/Td vaccine (3 - Td or Tdap) 01/25/2032   Pneumococcal Vaccine for age over 27  Completed   DEXA scan (bone density measurement)  Completed   Zoster (Shingles) Vaccine  Completed   Meningitis B Vaccine  Aged Out       05/20/2023   11:49 AM  Advanced Directives  Does Patient Have a Medical Advance Directive? Yes  Type of Estate agent of [place];Living will  Does patient want to make changes to medical advance directive? No - Patient declined  Copy of Healthcare Power of Attorney in Chart? Yes - validated most recent copy scanned in chart (See row information)   Advance  Care Planning is important because it: Ensures you receive medical care that aligns with your values, goals, and preferences. Provides guidance to your family and loved ones, reducing the emotional burden of decision-making during critical moments.  Vision: Annual vision screenings are recommended for early detection of glaucoma, cataracts, and diabetic retinopathy. These exams can also reveal signs of chronic conditions such as diabetes and high blood pressure.  Dental: Annual dental screenings help detect early signs of oral cancer, gum disease, and other conditions linked to overall health, including heart disease and diabetes.  Please see the attached documents for additional preventive care recommendations.

## 2024-03-23 NOTE — Progress Notes (Signed)
 Subjective:   Sabrina Ramirez is a 83 y.o. who presents for a Medicare Wellness preventive visit.  As a reminder, Annual Wellness Visits don't include a physical exam, and some assessments may be limited, especially if this visit is performed virtually. We may recommend an in-person follow-up visit with your provider if needed.  Visit Complete: Virtual I connected with  Sabrina Ramirez on 03/23/24 by a audio enabled telemedicine application and verified that I am speaking with the correct person using two identifiers.  Patient Location: Home  Provider Location: Home Office  I discussed the limitations of evaluation and management by telemedicine. The patient expressed understanding and agreed to proceed.  Vital Signs: Because this visit was a virtual/telehealth visit, some criteria may be missing or patient reported. Any vitals not documented were not able to be obtained and vitals that have been documented are patient reported.  VideoDeclined- This patient declined Librarian, academic. Therefore the visit was completed with audio only.  Persons Participating in Visit: Patient.  AWV Questionnaire: No: Patient Medicare AWV questionnaire was not completed prior to this visit.  Cardiac Risk Factors include: advanced age (>80men, >54 women);hypertension     Objective:    Today's Vitals   03/23/24 0829  Weight: 170 lb (77.1 kg)  Height: 5' 4 (1.626 m)  PainSc: 1    Body mass index is 29.18 kg/m.     03/23/2024    8:37 AM 05/20/2023   11:49 AM 04/24/2022    1:10 PM 01/15/2022   11:16 AM 03/29/2021   11:08 AM 03/11/2019    1:23 PM 01/26/2019    3:19 PM  Advanced Directives  Does Patient Have a Medical Advance Directive? Yes Yes Yes No Yes Yes No  Type of Estate agent of [place];Living will Healthcare Power of [place];Living will Healthcare Power of [place];Living will  Healthcare Power of Attorney Living will;Healthcare Power  of Attorney   Does patient want to make changes to medical advance directive? No - Patient declined No - Patient declined No - Patient declined   No - Patient declined   Copy of Healthcare Power of Attorney in Chart? Yes - validated most recent copy scanned in chart (See row information) Yes - validated most recent copy scanned in chart (See row information) Yes - validated most recent copy scanned in chart (See row information)  Yes - validated most recent copy scanned in chart (See row information) No - copy requested   Would patient like information on creating a medical advance directive?    No - Patient declined   No - Patient declined      Data saved with a previous flowsheet row definition    Current Medications (verified) Outpatient Encounter Medications as of 03/23/2024  Medication Sig   Acetaminophen  (TYLENOL  PO) Take by mouth daily as needed.   Albuterol  Sulfate (PROAIR  RESPICLICK) 108 (90 Base) MCG/ACT AEPB Inhale 1-2 puffs into the lungs every 6 (six) hours as needed (for wheezing/shortness of breath).    allopurinol (ZYLOPRIM) 100 MG tablet Take 100 mg by mouth daily.   aspirin  EC 81 MG tablet Take 1 tablet (81 mg total) by mouth at bedtime.   azelastine (ASTELIN) 0.1 % nasal spray Place into both nostrils 2 (two) times daily. Use in each nostril as directed   ciclopirox  (PENLAC ) 8 % solution Apply topically at bedtime. Apply over nail and surrounding skin. Apply daily over previous coat. After seven (7) days, may remove with alcohol and  continue cycle.   fluocinonide  cream (LIDEX ) 0.05 % APPLY CREAM EXTERNALLY TO AFFECTED AREA THREE TIMES DAILY FOR PSORIASIS FLARES DO NOT USE LONGER THAN 2 WEEKS   fluticasone  furoate-vilanterol (BREO ELLIPTA ) 200-25 MCG/INH AEPB Inhale 1 puff into the lungs daily.   Glucosamine-Chondroit-Vit C-Mn (GLUCOSAMINE 1500 COMPLEX) CAPS Take by mouth.   hydrALAZINE  (APRESOLINE ) 10 MG tablet Take 1 tablet (10 mg total) by mouth 3 (three) times daily.    loperamide (IMODIUM A-D) 2 MG tablet Take 2 mg by mouth 4 (four) times daily as needed for diarrhea or loose stools.   metoprolol  succinate (TOPROL -XL) 100 MG 24 hr tablet Take 1 tablet (100 mg total) by mouth daily.   montelukast  (SINGULAIR ) 10 MG tablet Take 10 mg by mouth at bedtime.    Multiple Vitamin (MULTIVITAMIN) tablet Take 1 tablet by mouth daily. Senior Multivitamin   nystatin  powder Apply 1 Application topically 3 (three) times daily.   omeprazole (PRILOSEC OTC) 20 MG tablet Take 20 mg by mouth daily.   rosuvastatin  (CRESTOR ) 5 MG tablet TAKE 1/2 (ONE-HALF) TABLET BY MOUTH IN THE EVENING   triamcinolone  cream (KENALOG ) 0.1 % Apply 1 application topically 2 (two) times daily as needed (for skin rash (Summertime)).    [DISCONTINUED] OVER THE COUNTER MEDICATION Systane Ultra Eyedrops   No facility-administered encounter medications on file as of 03/23/2024.    Allergies (verified) Amlodipine, Flexeril [cyclobenzaprine], Chlor-trimeton [chlorpheniramine], Ace inhibitors, Adhesive [tape], Angiotensin receptor blockers, Corn-containing products, Iodine, and Triamterene-hctz   History: Past Medical History:  Diagnosis Date   Allergic asthma, mild intermittent, uncomplicated 05/25/2017   Dr. Frutoso, allergist   Arthritis    Chronic kidney disease    Stage III kidney disease   GERD (gastroesophageal reflux disease)    Gout flare 01/17/2022   Hemorrhoids    History of kidney stones    Hypertension    IBS (irritable bowel syndrome)    IFG (impaired fasting glucose) 03/09/2019   a1c 6.3 02/2019   Nephrolithiasis 03/22/2021   Right ureteral stone; [facility] 2022   Psoriasis    Past Surgical History:  Procedure Laterality Date   ABDOMINAL HYSTERECTOMY  1987   CATARACT EXTRACTION W/ INTRAOCULAR LENS  IMPLANT, BILATERAL Bilateral 2015   CHOLECYSTECTOMY     HEMORRHOID SURGERY     SPINE SURGERY  07/10/2017   TONSILLECTOMY     Family History  Problem Relation Age of Onset    Stroke Mother    Diabetes Father    Heart attack Father    Heart disease Father    Hyperlipidemia Father    Hypertension Father    Stroke Father    Diabetes Sister    Hypertension Sister    Transient ischemic attack Sister    COPD Maternal Grandmother    Heart disease Maternal Grandmother    Deep vein thrombosis Maternal Grandfather    Stroke Maternal Grandfather    Deep vein thrombosis Paternal Grandmother    Hypertension Paternal Grandmother    Hypertension Daughter    Arthritis Maternal Aunt    COPD Maternal Aunt    Hypertension Maternal Aunt    Diabetes Paternal Uncle    Heart disease Paternal Uncle    Stroke Paternal Uncle    Breast cancer Neg Hx    Social History   Socioeconomic History   Marital status: Divorced    Spouse name: Not on file   Number of children: Not on file   Years of education: Not on file   Highest education level:  Not on file  Occupational History   Occupation: Retired  Tobacco Use   Smoking status: Never   Smokeless tobacco: Never  Vaping Use   Vaping status: Never Used  Substance and Sexual Activity   Alcohol use: Yes    Comment: occassionally   Drug use: No   Sexual activity: Never  Other Topics Concern   Not on file  Social History Narrative   Not on file   Social Drivers of Health   Financial Resource Strain: Low Risk  (03/23/2024)   Overall Financial Resource Strain (CARDIA)    Difficulty of Paying Living Expenses: Not hard at all  Food Insecurity: No Food Insecurity (05/20/2023)   Hunger Vital Sign    Worried About Running Out of Food in the Last Year: Never true    Ran Out of Food in the Last Year: Never true  Transportation Needs: No Transportation Needs (03/23/2024)   PRAPARE - Administrator, Civil Service (Medical): No    Lack of Transportation (Non-Medical): No  Physical Activity: Inactive (03/23/2024)   Exercise Vital Sign    Days of Exercise per Week: 0 days    Minutes of Exercise per Session: 0  min  Stress: No Stress Concern Present (03/23/2024)   [employer] of Occupational Health - Occupational Stress Questionnaire    Feeling of Stress: Not at all  Social Connections: Moderately Isolated (03/23/2024)   Social Connection and Isolation Panel    Frequency of Communication with Friends and Family: More than three times a week    Frequency of Social Gatherings with Friends and Family: More than three times a week    Attends Religious Services: More than 4 times per year    Active Member of [employer] or Organizations: No    Attends Engineer, structural: Never    Marital Status: Divorced    Tobacco Counseling Counseling given: Not Answered    Clinical Intake:  Pre-visit preparation completed: Yes  Pain : 0-10 Pain Score: 1  Pain Type: Acute pain Pain Location: Ear Pain Orientation: Left Pain Descriptors / Indicators: Sore (from procedure)     BMI - recorded: 29.18 Nutritional Status: BMI 25 -29 Overweight Diabetes: No  Lab Results  Component Value Date   HGBA1C 6.0 (A) 01/06/2024   HGBA1C 6.4 07/08/2023   HGBA1C 6.2 06/27/2022     How often do you need to have someone help you when you read instructions, pamphlets, or other written materials from your doctor or pharmacy?: 1 - Never  Interpreter Needed?: No  Information entered by :: Ellouise Haws, LPN   Activities of Daily Living     03/23/2024    8:33 AM 05/20/2023   11:43 AM  In your present state of health, do you have any difficulty performing the following activities:  Hearing? 1 1  Comment hearing aids hearing aids  Vision? 0 0  Difficulty concentrating or making decisions? 0 0  Walking or climbing stairs? 1 0  Dressing or bathing? 0 0  Doing errands, shopping? 0 0  Preparing Food and eating ? N N  Using the Toilet? N N  In the past six months, have you accidently leaked urine? N N  Do you have problems with loss of bowel control? N N  Managing your Medications? N N  Managing  your Finances? N N  Housekeeping or managing your Housekeeping? N N    Patient Care Team: Jodie Lavern CROME, MD as PCP - General (Family Medicine) Frutoso Luz,  MD as Referring Physician (Allergy) Lanis Pupa, MD as Consulting Physician (Neurosurgery) Mottinger, [place] (Dentistry) Sheffield, Andrez SAUNDERS, PA-C (Inactive) as Physician Assistant (Dermatology) Macel Jayson PARAS, MD as Consulting Physician (Nephrology)  I have updated your Care Teams any recent Medical Services you may have received from other providers in the past year.     Assessment:   This is a routine wellness examination for [place].  Hearing/Vision screen Hearing Screening - Comments:: Pt has hearing aids  Vision Screening - Comments:: Wears rx glasses - up to date with routine eye exams with [facility] ophthalmology    Goals Addressed             This Visit's Progress    Patient Stated       Start exercise        Depression Screen     03/23/2024    8:34 AM 01/06/2024   11:44 AM 07/08/2023   10:20 AM 05/20/2023   11:51 AM 01/05/2023    9:03 AM 11/24/2022    9:18 AM 06/27/2022   10:33 AM  PHQ 2/9 Scores  PHQ - 2 Score 0 0 0 0 0 0 0    Fall Risk     03/23/2024    8:37 AM 01/06/2024   11:44 AM 07/08/2023   10:20 AM 05/20/2023   11:50 AM 01/05/2023    9:02 AM  Fall Risk   Falls in the past year? 1 0 0 0 0  Number falls in past yr: 1 0 0 0 0  Injury with Fall? 0 0 0 0 0  Risk for fall due to : History of fall(s);Impaired balance/gait;Impaired mobility No Fall Risks No Fall Risks Impaired balance/gait No Fall Risks  Follow up Falls prevention discussed Falls evaluation completed Falls evaluation completed Falls prevention discussed Falls evaluation completed    MEDICARE RISK AT HOME:  Medicare Risk at Home Any stairs in or around the home?: Yes If so, are there any without handrails?: No Home free of loose throw rugs in walkways, pet beds, electrical cords, etc?: Yes Adequate lighting  in your home to reduce risk of falls?: Yes Life alert?: No Use of a cane, walker or w/c?: Yes Grab bars in the bathroom?: No Shower chair or bench in shower?: Yes Elevated toilet seat or a handicapped toilet?: Yes  TIMED UP AND GO:  Was the test performed?  No  Cognitive Function: 6CIT completed        03/23/2024    8:38 AM 05/20/2023   11:53 AM 03/29/2021   11:11 AM 03/23/2020   11:09 AM  6CIT Screen  What Year? 0 points 0 points 0 points 0 points  What month? 0 points 0 points 0 points 0 points  What time? 0 points 0 points 0 points   Count back from 20 0 points 0 points 0 points 0 points  Months in reverse 0 points 0 points 0 points 0 points  Repeat phrase 0 points 0 points 0 points 0 points  Total Score 0 points 0 points 0 points     Immunizations Immunization History  Administered Date(s) Administered   Fluad Quad(high Dose 65+) 03/06/2016, 04/09/2017, 03/08/2019, 03/27/2020, 03/22/2021, 03/10/2022   INFLUENZA, HIGH DOSE SEASONAL PF 03/02/2012, 04/06/2013, 04/21/2014, 04/16/2015, 03/06/2016, 04/09/2017, 03/01/2018   Influenza-Unspecified 04/12/2011   PFIZER(Purple Top)SARS-COV-2 Vaccination 07/02/2019, 07/23/2019, 09/16/2019, 03/06/2020, 03/20/2021   Pfizer Covid-19 Vaccine Bivalent Booster 65yrs & up 03/20/2021   Pneumococcal Conjugate-13 10/05/2014   Pneumococcal Polysaccharide-23 04/23/2010, 11/12/2015, 07/01/2016, 07/13/2017, 01/11/2018, 09/09/2021, 09/28/2023  Pneumococcal-Unspecified 04/23/2010   Tdap 05/23/2010, 01/24/2022   Zoster Recombinant(Shingrix) 11/18/2017, 03/23/2018   Zoster, Live 06/09/2004    Screening Tests Health Maintenance  Topic Date Due   Influenza Vaccine  01/08/2024   COVID-19 Vaccine (6 - 2025-26 season) 02/08/2024   Mammogram  03/29/2024   Medicare Annual Wellness (AWV)  03/23/2025   DTaP/Tdap/Td (3 - Td or Tdap) 01/25/2032   Pneumococcal Vaccine: 50+ Years  Completed   DEXA SCAN  Completed   Zoster Vaccines- Shingrix   Completed   Meningococcal B Vaccine  Aged Out    Health Maintenance Items Addressed: See Nurse Notes at the end of this note  Additional Screening:  Vision Screening: Recommended annual ophthalmology exams for early detection of glaucoma and other disorders of the eye. Is the patient up to date with their annual eye exam?  Yes  Who is the provider or what is the name of the office in which the patient attends annual eye exams? [facility]   Dental Screening: Recommended annual dental exams for proper oral hygiene  Community Resource Referral / Chronic Care Management: CRR required this visit?  No   CCM required this visit?  No   Plan:    I have personally reviewed and noted the following in the patient's chart:   Medical and social history Use of alcohol, tobacco or illicit drugs  Current medications and supplements including opioid prescriptions. Patient is not currently taking opioid prescriptions. Functional ability and status Nutritional status Physical activity Advanced directives List of other physicians Hospitalizations, surgeries, and ER visits in previous 12 months Vitals Screenings to include cognitive, depression, and falls Referrals and appointments  In addition, I have reviewed and discussed with patient certain preventive protocols, quality metrics, and best practice recommendations. A written personalized care plan for preventive services as well as general preventive health recommendations were provided to patient.   Ellouise VEAR Haws, LPN   [id number]   After Visit Summary: (MyChart) Due to this being a telephonic visit, the after visit summary with patients personalized plan was offered to patient via MyChart   Notes: PCP Follow Up Recommendations: Pt request prescription for rollator walker stating she can't stand for long period of time

## 2024-04-04 ENCOUNTER — Encounter: Payer: Self-pay | Admitting: Physician Assistant

## 2024-04-04 ENCOUNTER — Ambulatory Visit: Admitting: Physician Assistant

## 2024-04-04 VITALS — BP 148/73 | HR 59

## 2024-04-04 DIAGNOSIS — Z1283 Encounter for screening for malignant neoplasm of skin: Secondary | ICD-10-CM | POA: Diagnosis not present

## 2024-04-04 DIAGNOSIS — D1801 Hemangioma of skin and subcutaneous tissue: Secondary | ICD-10-CM

## 2024-04-04 DIAGNOSIS — L821 Other seborrheic keratosis: Secondary | ICD-10-CM

## 2024-04-04 DIAGNOSIS — L578 Other skin changes due to chronic exposure to nonionizing radiation: Secondary | ICD-10-CM

## 2024-04-04 DIAGNOSIS — L814 Other melanin hyperpigmentation: Secondary | ICD-10-CM | POA: Diagnosis not present

## 2024-04-04 DIAGNOSIS — H61009 Unspecified perichondritis of external ear, unspecified ear: Secondary | ICD-10-CM

## 2024-04-04 DIAGNOSIS — W908XXA Exposure to other nonionizing radiation, initial encounter: Secondary | ICD-10-CM

## 2024-04-04 DIAGNOSIS — D229 Melanocytic nevi, unspecified: Secondary | ICD-10-CM

## 2024-04-04 DIAGNOSIS — L409 Psoriasis, unspecified: Secondary | ICD-10-CM

## 2024-04-04 DIAGNOSIS — H61002 Unspecified perichondritis of left external ear: Secondary | ICD-10-CM

## 2024-04-04 NOTE — Progress Notes (Signed)
 Total Body Skin Exam (TBSE) Visit   Subjective  Sabrina Ramirez is a 83 y.o. female NEW PATIENT who presents for the following: Skin Cancer Screening and Full Body Skin Exam  Patient presents today for follow up visit for TBSE. Patient was last evaluated on 03/07/24 by Dr. Corey and underwent biopsy of her left ear. Pathology was consistent with inflamed cartilage. Patient denies medication changes. Patient reports she does not have spots, moles and lesions of concern to be evaluated. Patient reports throughout her lifetime she has had minimal sun exposure. Currently, patient reports if she has excessive sun exposure, she does apply sunscreen and/or wears protective coverings. Patient denies  family history of skin cancers. The patient has spots, moles and lesions to be evaluated, some may be new or changing and the patient has concerns that these could be cancer.  The following portions of the chart were reviewed this encounter and updated as appropriate: medications, allergies, medical history  Review of Systems:  No other skin or systemic complaints except as noted in HPI or Assessment and Plan.  Objective  Well appearing patient in no apparent distress; mood and affect are within normal limits.  A full examination was performed including scalp, head, eyes, ears, nose, lips, neck, chest, axillae, abdomen, back, buttocks, bilateral upper extremities, bilateral lower extremities, hands, feet, fingers, toes, fingernails, and toenails. All findings within normal limits unless otherwise noted below.   Relevant physical exam findings are noted in the Assessment and Plan.    Assessment & Plan   LENTIGINES, SEBORRHEIC KERATOSES, HEMANGIOMAS - Benign normal skin lesions - Benign-appearing - Call for any changes  MELANOCYTIC NEVI - Tan-brown and/or pink-flesh-colored symmetric macules and papules - Benign appearing on exam today - Observation - Call clinic for new or changing moles -  Recommend daily use of broad spectrum spf 30+ sunscreen to sun-exposed areas.   ACTINIC DAMAGE - Chronic condition, secondary to cumulative UV/sun exposure - diffuse scaly erythematous macules with underlying dyspigmentation - Recommend daily broad spectrum sunscreen SPF 30+ to sun-exposed areas, reapply every 2 hours as needed.  - Staying in the shade or wearing long sleeves, sun glasses (UVA+UVB protection) and wide brim hats (4-inch brim around the entire circumference of the hat) are also recommended for sun protection.  - Call for new or changing lesions.  SKIN CANCER SCREENING PERFORMED TODAY.  PSORIASIS Exam: Well-demarcated erythematous papules/plaques with silvery scale, guttate pink scaly papules. 5 % BSA.  Well controlled   patient denies joint pain  Psoriasis is a chronic non-curable, but treatable genetic/hereditary disease that may have other systemic features affecting other organ systems such as joints (Psoriatic Arthritis). It is associated with an increased risk of inflammatory bowel disease, heart disease, non-alcoholic fatty liver disease, and depression.  Treatments include light and laser treatments; topical medications; and systemic medications including oral and injectables.  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 If she likes this, she will MyChart us  and let us  know to send in Rx.      LENTIGINES   SEBORRHEIC KERATOSIS   CHERRY ANGIOMA   MULTIPLE BENIGN NEVI   ACTINIC SKIN DAMAGE   SCREENING EXAM FOR SKIN CANCER   PSORIASIS   CHONDRODERMATITIS  NODULARIS   Return in about 1 year (around 04/04/2025) for TBSE follow up .  I, Doyce Pan, CMA, am acting as scribe  for Eros Montour K, PA-C.   Documentation: I have reviewed the above documentation for accuracy and completeness, and I agree with the above.  Ebenezer Mccaskey K, PA-C

## 2024-04-04 NOTE — Patient Instructions (Signed)

## 2024-04-27 ENCOUNTER — Encounter: Payer: Self-pay | Admitting: Family Medicine

## 2024-04-27 ENCOUNTER — Ambulatory Visit: Payer: Self-pay

## 2024-04-27 ENCOUNTER — Ambulatory Visit: Admitting: Family Medicine

## 2024-04-27 ENCOUNTER — Ambulatory Visit: Payer: Self-pay | Admitting: Family Medicine

## 2024-04-27 VITALS — BP 132/68 | HR 65 | Temp 97.7°F | Ht 64.0 in | Wt 176.2 lb

## 2024-04-27 DIAGNOSIS — N3001 Acute cystitis with hematuria: Secondary | ICD-10-CM

## 2024-04-27 DIAGNOSIS — N184 Chronic kidney disease, stage 4 (severe): Secondary | ICD-10-CM

## 2024-04-27 DIAGNOSIS — R300 Dysuria: Secondary | ICD-10-CM

## 2024-04-27 LAB — BASIC METABOLIC PANEL WITH GFR
BUN: 35 mg/dL — ABNORMAL HIGH (ref 6–23)
CO2: 22 meq/L (ref 19–32)
Calcium: 9.5 mg/dL (ref 8.4–10.5)
Chloride: 111 meq/L (ref 96–112)
Creatinine, Ser: 2.38 mg/dL — ABNORMAL HIGH (ref 0.40–1.20)
GFR: 18.47 mL/min — ABNORMAL LOW (ref >60.00)
Glucose, Bld: 150 mg/dL — ABNORMAL HIGH (ref 70–99)
Potassium: 4.8 meq/L (ref 3.5–5.1)
Sodium: 141 meq/L (ref 135–145)

## 2024-04-27 LAB — POCT URINALYSIS DIPSTICK
Bilirubin, UA: NEGATIVE
Blood, UA: POSITIVE
Glucose, UA: NEGATIVE
Ketones, UA: NEGATIVE
Nitrite, UA: NEGATIVE
Protein, UA: POSITIVE — AB
Spec Grav, UA: 1.02 (ref 1.010–1.025)
Urobilinogen, UA: 0.2 U/dL
pH, UA: 6 (ref 5.0–8.0)

## 2024-04-27 MED ORDER — AMOXICILLIN-POT CLAVULANATE 500-125 MG PO TABS: 1.0000 | ORAL_TABLET | Freq: Two times a day (BID) | ORAL | 0 refills | Status: DC

## 2024-04-27 NOTE — Patient Instructions (Signed)
 Please go downstairs for labs on your way out.  I am checking your kidney function and then I will prescribe an antibiotic for your urinary tract infection.  We will also send a urine culture and let you know if the antibiotic choice is adequate.  Be sure you are staying well-hydrated.  Let us  know if you have any new or worsening symptoms such as fever, chills, nausea, vomiting or pain.

## 2024-04-27 NOTE — Telephone Encounter (Signed)
 No appointments available at PCP office today Appointment made at [facility] today and patient given the address of [facility] Only or Action Required?: FYI only for provider: appointment scheduled on 04/27/2024 at 1pm with Boby Mackintosh NP-C .  Patient was last seen in primary care on 01/06/2024 by Jodie Lavern CROME, MD.  Called Nurse Triage reporting Urinary Frequency.  Symptoms began several days ago.  Interventions attempted: Rest, hydration, or home remedies.  Symptoms are: gradually worsening.  Triage Disposition: See HCP Within 4 Hours (Or PCP Triage)  Patient/caregiver understands and will follow disposition?: Yes              Reason for Triage: Possible UTI. No blood in urine, some burning sensation - started Monday. Wanting to get a prescription called in for her.  Reason for Disposition . [1] SEVERE pain with urination (e.g., excruciating) AND [2] not improved after 2 hours of pain medicine  Answer Assessment - Initial Assessment Questions Started 3 days ago Patient states that she does have an existing kidney stone at this time but this is being followed by urology  No appointments available at PCP office today Appointment made at [facility] today and patient given the address of [facility]  Patient is advised to call us  back if anything changes or with any further questions/concerns. Patient is advised that if anything worsens to go to the Emergency Room. Patient verbalized understanding.    1. SEVERITY: How bad is the pain?  (e.g., Scale 1-10; mild, moderate, or severe)     Burning with urination 5. FEVER: Do you have a fever? If Yes, ask: What is your temperature, how was it measured, and when did it start?     no 6. PAST UTI: Have you had a urine infection before? If Yes, ask: When was the last time? and What happened that time?      Has had a UTI before 8. OTHER SYMPTOMS: Do you have any other symptoms?  (e.g., blood in urine, flank pain, genital sores, urgency, vaginal discharge)     Itching some, frequency,  Protocols used: Urination Pain - Female-A-AH

## 2024-04-27 NOTE — Progress Notes (Signed)
 Subjective:     Patient ID: Sabrina Ramirez, female    DOB: 1940/10/19, 83 y.o.   MRN: [id number]  Chief Complaint  Patient presents with   Dysuria    Urinary frequency and vaginal itching     Dysuria     Discussed the use of AI scribe software for clinical note transcription with the patient, who gave verbal consent to proceed.  History of Present Illness Sabrina Ramirez is an 83 year old female with chronic kidney disease and hypertension who presents with dysuria and urinary frequency.   Lower urinary tract symptoms - Dysuria and urinary frequency present x 4 days - Nocturia occurring four to five times per night - Pain and burning with urination - Hematuria observed - No fever, chills, nausea, vomiting, lower abdominal pain, or pelvic pain - History of kidney stones - No urinary tract infection in years but recent episode of diarrhea   Vaginal symptoms - Vaginal itching described as burning sensation during urination - Discomfort attributed to psoriasis in the area, which causes burning when in contact with urine - No suspicion for yeast infection  Chronic kidney disease and hypertension - Chronic kidney disease and hypertension present - Last blood work performed in June per patient but not in EMR.  - Last GFR 18 in January 2025      Health Maintenance Due  Topic Date Due   Influenza Vaccine  01/08/2024   COVID-19 Vaccine (6 - 2025-26 season) 02/08/2024   Mammogram  03/29/2024    Past Medical History:  Diagnosis Date   Allergic asthma, mild intermittent, uncomplicated 05/25/2017   Dr. Frutoso, allergist   Arthritis    Chronic kidney disease    Stage III kidney disease   GERD (gastroesophageal reflux disease)    Gout flare 01/17/2022   Hemorrhoids    History of kidney stones    Hypertension    IBS (irritable bowel syndrome)    IFG (impaired fasting glucose) 03/09/2019   a1c 6.3 02/2019   Nephrolithiasis 03/22/2021   Right ureteral  stone; [facility] 2022   Psoriasis     Past Surgical History:  Procedure Laterality Date   ABDOMINAL HYSTERECTOMY  1987   CATARACT EXTRACTION W/ INTRAOCULAR LENS  IMPLANT, BILATERAL Bilateral 2015   CHOLECYSTECTOMY     HEMORRHOID SURGERY     SPINE SURGERY  07/10/2017   TONSILLECTOMY      Family History  Problem Relation Age of Onset   Stroke Mother    Diabetes Father    Heart attack Father    Heart disease Father    Hyperlipidemia Father    Hypertension Father    Stroke Father    Diabetes Sister    Hypertension Sister    Transient ischemic attack Sister    COPD Maternal Grandmother    Heart disease Maternal Grandmother    Deep vein thrombosis Maternal Grandfather    Stroke Maternal Grandfather    Deep vein thrombosis Paternal Grandmother    Hypertension Paternal Grandmother    Hypertension Daughter    Arthritis Maternal Aunt    COPD Maternal Aunt    Hypertension Maternal Aunt    Diabetes Paternal Uncle    Heart disease Paternal Uncle    Stroke Paternal Uncle    Breast cancer Neg Hx     Social History   Socioeconomic History   Marital status: Divorced    Spouse name: Not on file   Number of children: Not on file  Years of education: Not on file   Highest education level: Not on file  Occupational History   Occupation: Retired  Tobacco Use   Smoking status: Never   Smokeless tobacco: Never  Vaping Use   Vaping status: Never Used  Substance and Sexual Activity   Alcohol use: Yes    Comment: occassionally   Drug use: No   Sexual activity: Never  Other Topics Concern   Not on file  Social History Narrative   Not on file   Social Drivers of Health   Financial Resource Strain: Low Risk  (03/23/2024)   Overall Financial Resource Strain (CARDIA)    Difficulty of Paying Living Expenses: Not hard at all  Food Insecurity: No Food Insecurity (05/20/2023)   Hunger Vital Sign    Worried About Running Out of Food in the Last Year: Never true    Ran  Out of Food in the Last Year: Never true  Transportation Needs: No Transportation Needs (03/23/2024)   PRAPARE - Administrator, Civil Service (Medical): No    Lack of Transportation (Non-Medical): No  Physical Activity: Inactive (03/23/2024)   Exercise Vital Sign    Days of Exercise per Week: 0 days    Minutes of Exercise per Session: 0 min  Stress: No Stress Concern Present (03/23/2024)   [employer] of Occupational Health - Occupational Stress Questionnaire    Feeling of Stress: Not at all  Social Connections: Moderately Isolated (03/23/2024)   Social Connection and Isolation Panel    Frequency of Communication with Friends and Family: More than three times a week    Frequency of Social Gatherings with Friends and Family: More than three times a week    Attends Religious Services: More than 4 times per year    Active Member of [employer] or Organizations: No    Attends Banker Meetings: Never    Marital Status: Divorced  Catering Manager Violence: Not At Risk (03/23/2024)   Humiliation, Afraid, Rape, and Kick questionnaire    Fear of Current or Ex-Partner: No    Emotionally Abused: No    Physically Abused: No    Sexually Abused: No    Outpatient Medications Prior to Visit  Medication Sig Dispense Refill   Acetaminophen  (TYLENOL  PO) Take by mouth daily as needed.     Albuterol  Sulfate (PROAIR  RESPICLICK) 108 (90 Base) MCG/ACT AEPB Inhale 1-2 puffs into the lungs every 6 (six) hours as needed (for wheezing/shortness of breath).      allopurinol (ZYLOPRIM) 100 MG tablet Take 100 mg by mouth daily.     aspirin  EC 81 MG tablet Take 1 tablet (81 mg total) by mouth at bedtime.     azelastine (ASTELIN) 0.1 % nasal spray Place into both nostrils 2 (two) times daily. Use in each nostril as directed     ciclopirox  (PENLAC ) 8 % solution Apply topically at bedtime. Apply over nail and surrounding skin. Apply daily over previous coat. After seven (7) days, may remove  with alcohol and continue cycle. 6.6 mL 1   fluocinonide  cream (LIDEX ) 0.05 % APPLY CREAM EXTERNALLY TO AFFECTED AREA THREE TIMES DAILY FOR PSORIASIS FLARES DO NOT USE LONGER THAN 2 WEEKS 60 g 0   fluticasone  furoate-vilanterol (BREO ELLIPTA ) 200-25 MCG/INH AEPB Inhale 1 puff into the lungs daily.     Glucosamine-Chondroit-Vit C-Mn (GLUCOSAMINE 1500 COMPLEX) CAPS Take by mouth.     hydrALAZINE  (APRESOLINE ) 10 MG tablet Take 1 tablet (10 mg total) by mouth 3 (three)  times daily. 270 tablet 3   loperamide (IMODIUM A-D) 2 MG tablet Take 2 mg by mouth 4 (four) times daily as needed for diarrhea or loose stools.     metoprolol  succinate (TOPROL -XL) 100 MG 24 hr tablet Take 1 tablet (100 mg total) by mouth daily. 90 tablet 3   montelukast  (SINGULAIR ) 10 MG tablet Take 10 mg by mouth at bedtime.      Multiple Vitamin (MULTIVITAMIN) tablet Take 1 tablet by mouth daily. Senior Multivitamin     nystatin  powder Apply 1 Application topically 3 (three) times daily.     omeprazole (PRILOSEC OTC) 20 MG tablet Take 20 mg by mouth daily.     rosuvastatin  (CRESTOR ) 5 MG tablet TAKE 1/2 (ONE-HALF) TABLET BY MOUTH IN THE EVENING 45 tablet 0   triamcinolone  cream (KENALOG ) 0.1 % Apply 1 application topically 2 (two) times daily as needed (for skin rash (Summertime)).      No facility-administered medications prior to visit.    Allergies  Allergen Reactions   Amlodipine Swelling   Flexeril [Cyclobenzaprine] Other (See Comments)    Hallucinations   Chlor-Trimeton [Chlorpheniramine] Other (See Comments)   Ace Inhibitors Cough   Adhesive [Tape] Rash    PAPER TAPE OK   Angiotensin Receptor Blockers Cough   Corn-Containing Products Other (See Comments)    PLEASE BE ADVISED HIGH ALERT FOR GLUCOSE DRIPS/POPSICLES/JELLO FOR SWEETENERS WITH CORN   HEADACHES/RUNNY NOSE/GI UPSET/LARGE INTESTINE GROWTH/STOMACH CRAMPING     Iodine Rash   Triamterene-Hctz Cough    Review of Systems  Genitourinary:  Positive for  dysuria.   Per HPI     Objective:    Physical Exam Constitutional:      General: She is not in acute distress.    Appearance: She is not ill-appearing.  HENT:     Mouth/Throat:     Mouth: Mucous membranes are moist.     Pharynx: Oropharynx is clear.  Eyes:     Extraocular Movements: Extraocular movements intact.     Conjunctiva/sclera: Conjunctivae normal.     Pupils: Pupils are equal, round, and reactive to light.  Cardiovascular:     Rate and Rhythm: Normal rate and regular rhythm.  Pulmonary:     Effort: Pulmonary effort is normal.     Breath sounds: Normal breath sounds.  Abdominal:     General: Bowel sounds are normal. There is no distension.     Palpations: Abdomen is soft.     Tenderness: There is no abdominal tenderness. There is no right CVA tenderness, left CVA tenderness, guarding or rebound.  Musculoskeletal:     Cervical back: Normal range of motion and neck supple.     Right lower leg: No edema.     Left lower leg: No edema.  Skin:    General: Skin is warm and dry.  Neurological:     General: No focal deficit present.     Mental Status: She is alert and oriented to person, place, and time.  Psychiatric:        Mood and Affect: Mood normal.        Behavior: Behavior normal.        Thought Content: Thought content normal.      BP 132/68 (BP Location: Right Arm)   Pulse 65   Temp 97.7 F (36.5 C) (Temporal)   Ht 5' 4 (1.626 m)   Wt 176 lb 3.2 oz (79.9 kg)   SpO2 97%   BMI 30.24 kg/m  Wt Readings from Last  3 Encounters:  04/27/24 176 lb 3.2 oz (79.9 kg)  03/23/24 170 lb (77.1 kg)  01/06/24 172 lb (78 kg)       Assessment & Plan:   Problem List Items Addressed This Visit   None Visit Diagnoses       Acute cystitis with hematuria    -  Primary   Relevant Orders   Urine Culture     Dysuria       Relevant Orders   POCT Urinalysis Dipstick (Completed)     Stage 4 chronic kidney disease (HCC)       Relevant Orders   Basic metabolic panel  with GFR (Completed)       Assessment and Plan Assessment & Plan Acute urinary tract infection with hematuria and dysuria Acute urinary tract infection with symptoms of dysuria, urinary frequency, and hematuria. Symptoms began 4 days ago. No fever, chills, nausea, vomiting, lower abdominal pain, or pelvic pain. No recurrent urinary tract infections. Vaginal itching and burning likely due to psoriasis medication irritation. - Ordered urine culture - Ordered STAT basic metabolic panel to assess renal function - Will prescribe antibiotic based on renal function results  Chronic kidney disease, stage 4 Chronic kidney disease stage 4, requiring careful consideration of antibiotic dosing due to potential nephrotoxicity. Last blood work with nephrologist in June but not in EMR. Last GFR 18 in 06/2023.  - Ordered stat basic metabolic panel to assess renal function - Will adjust antibiotic dosing based on renal function results  Addendum: GFR stable at 18  Estimated Creatinine Clearance: 18.6 mL/min (A) (by C-G formula based on SCr of 2.38 mg/dL (H)).  Augmentin 500-125 mg bid x 5 days prescribed (renal dosing)   I am having Sabrina PARAS. Lewellyn Dickey Ramirez start on amoxicillin-clavulanate. I am also having her maintain her montelukast , triamcinolone  cream, multivitamin, Albuterol  Sulfate, fluticasone  furoate-vilanterol, aspirin  EC, Acetaminophen  (TYLENOL  PO), ciclopirox , allopurinol, nystatin , azelastine, Glucosamine 1500 Complex, omeprazole, loperamide, metoprolol  succinate, hydrALAZINE , rosuvastatin , and fluocinonide  cream.  Meds ordered this encounter  Medications   amoxicillin-clavulanate (AUGMENTIN) 500-125 MG tablet    Sig: Take 1 tablet by mouth 2 (two) times daily.    Dispense:  10 tablet    Refill:  0    Supervising Provider:   ROLLENE Sabrina A [[id number]]

## 2024-04-29 LAB — URINE CULTURE

## 2024-06-11 ENCOUNTER — Other Ambulatory Visit: Payer: Self-pay | Admitting: Family Medicine

## 2024-06-13 ENCOUNTER — Ambulatory Visit: Payer: Self-pay

## 2024-06-13 NOTE — Telephone Encounter (Signed)
 FYI Only or Action Required?: FYI only for provider: appointment scheduled on 06/14/24, advised to go to UC/ED if symptoms worsen.  Patient was last seen in primary care on 04/27/2024 by Lendia Boby CROME, NP-C.  Called Nurse Triage reporting Leg Pain and Leg Swelling.  Symptoms began several weeks ago.  Interventions attempted: OTC medications: Acetaminophen  and Rest, hydration, or home remedies.  Symptoms are: unchanged.  Triage Disposition: See HCP Within 4 Hours (Or PCP Triage)  Patient/caregiver understands and will follow disposition?: Yes  Reason for Disposition  [1] Thigh or calf pain AND [2] only 1 side AND [3] present > 1 hour  (Exception: Chronic unchanged pain.)  Answer Assessment - Initial Assessment Questions Office visit advised, scheduled 06/14/24 early morning. Advised to go to UC/ED if symptoms worsen.   1. ONSET: When did the pain start?      2 weeks ago  2. LOCATION: Where is the pain located?      Back of left thigh from hip down to knee  3. PAIN: How bad is the pain?    (Scale 1-10; or mild, moderate, severe)     3-5/10  4. WORK OR EXERCISE: Has there been any recent work or exercise that involved this part of the body?      No  5. CAUSE: What do you think is causing the leg pain?     Pulled muscle  6. OTHER SYMPTOMS: Do you have any other symptoms? (e.g., chest pain, back pain, breathing difficulty, swelling, rash, fever, numbness, weakness)     Mild swelling to thigh, reports redness, stiffness, and occasional numbness in left foot; weakness-states her leg gave out when she went to bear weight on it recently, but she is able to walk around  7. PREGNANCY: Is there any chance you are pregnant? When was your last menstrual period?     NA  Protocols used: Leg Pain-A-AH  Copied from CRM #[id number]. Topic: Clinical - Red Word Triage >> Jun 13, 2024  2:57 PM Montie POUR wrote: Red Word that prompted transfer to Nurse Triage:  For the past 2  weeks, her left back thigh (from hip to knee) swollen. Pain level from 3 to 10. More pain standing up.

## 2024-06-14 ENCOUNTER — Ambulatory Visit (HOSPITAL_COMMUNITY)
Admission: RE | Admit: 2024-06-14 | Discharge: 2024-06-14 | Disposition: A | Discharge status: Home / Self Care | Source: Ambulatory Visit | Attending: Vascular Surgery | Admitting: Vascular Surgery

## 2024-06-14 ENCOUNTER — Encounter: Payer: Self-pay | Admitting: Internal Medicine

## 2024-06-14 ENCOUNTER — Ambulatory Visit: Admitting: Internal Medicine

## 2024-06-14 ENCOUNTER — Ambulatory Visit: Payer: Self-pay | Admitting: Internal Medicine

## 2024-06-14 ENCOUNTER — Ambulatory Visit: Payer: Self-pay

## 2024-06-14 VITALS — BP 130/72 | HR 72 | Temp 97.2°F | Ht 64.0 in | Wt 174.6 lb

## 2024-06-14 DIAGNOSIS — M7989 Other specified soft tissue disorders: Secondary | ICD-10-CM

## 2024-06-14 DIAGNOSIS — M79605 Pain in left leg: Secondary | ICD-10-CM | POA: Diagnosis not present

## 2024-06-14 DIAGNOSIS — N184 Chronic kidney disease, stage 4 (severe): Secondary | ICD-10-CM

## 2024-06-14 MED ORDER — APIXABAN 2.5 MG PO TABS: 2.5000 mg | ORAL_TABLET | Freq: Two times a day (BID) | ORAL | 0 refills | Status: AC

## 2024-06-14 MED ORDER — TRAMADOL HCL 50 MG PO TABS: 50.0000 mg | ORAL_TABLET | Freq: Three times a day (TID) | ORAL | 0 refills | Status: AC | PRN

## 2024-06-14 NOTE — Progress Notes (Signed)
 Great news!  They probably already told you but there was no blood clot.   This means you should NOT take apixaban , but you can still take tramadol  for pain.  Given the obvious swelling of the soft tissue, I think its just muscle injury that is slow healing from sitting up on  the rolled up sweater.   It should improve over next week or two if that is true.  Regardless I want you to follow up with Dr. Vernell.   Also feel free to massage the pain all you like now.   Heat and ice are also fine.   If its persisting its ok to stop rosuvastatin  to see if that helps.

## 2024-06-14 NOTE — Progress Notes (Signed)
 ==============================  [name]: [phone]   -- Medical Office Visit --  Patient: Sabrina Ramirez      Age: 84 y.o.       Sex:  female  Date:   06/14/2024 Today's Healthcare Provider: Bernardino KANDICE Cone, MD  ==============================   Chief Complaint: Thigh pain and Leg Pain (Left leg pain has been going on couple weeks. Aches all the time hard to walk at times )  Discussed the use of AI scribe software for clinical note transcription with the patient, who gave verbal consent to proceed. History of Present Illness 84 year old female with degenerative disc disease who presents with a deep ache in the left thigh.  She has been experiencing a bone-deep ache in the back of her left thigh, extending from the hip to the knee, for over two weeks. The pain began after sitting on a balled-up coat and has not improved. The pain is described as deep, sometimes feeling like it originates from the joint, but primarily felt in the muscle.  No swelling or redness has been observed, although her daughter noted the area appeared slightly raised compared to the other leg. She denies any history of blood clots and has not noticed any rope-like structures in the affected area. No recent falls except one incident six months ago.  Her current medications include omeprazole and Crestor , which she has been taking for a long time. She has been using extra strength Tylenol  for pain, but it has not been very effective. 13.6 and 19.7 left leg 13.7 and 19.5 right leg Photographs Taken 06/14/2024 :      Background Reviewed: Problem List: has Essential hypertension; Chronic allergic rhinitis; Colon polyp; Conjunctivitis, allergic, chronic; GERD (gastroesophageal reflux disease); Hypertriglyceridemia; Irritable bowel syndrome with diarrhea; Obesity, Class I, BMI 30-34.9; Ophthalmic migraine; Osteoarthritis, multiple sites; Psoriasis; Rosacea; Essential tremor;  Allergic asthma, mild persistent, uncomplicated; Spondylolisthesis at L5-S1 level; On statin therapy due to risk of future cardiovascular event; Localized primary osteoarthritis of carpometacarpal joint of left thumb; IFG (impaired fasting glucose); Nephrolithiasis; Chronic kidney disease, stage 4 (severe) (HCC); Anemia associated with stage 4 chronic renal failure (HCC); Allergic rhinitis due to pollen; Gout due to renal impairment; Sensorineural hearing loss (SNHL) of both ears; and Female pattern hair loss on their problem list. Past Medical History:  has a past medical history of Allergic asthma, mild intermittent, uncomplicated (05/25/2017), Arthritis, Chronic kidney disease, GERD (gastroesophageal reflux disease), Gout flare (01/17/2022), Hemorrhoids, History of kidney stones, Hypertension, IBS (irritable bowel syndrome), IFG (impaired fasting glucose) (03/09/2019), Nephrolithiasis (03/22/2021), and Psoriasis. Past Surgical History:   has a past surgical history that includes Cholecystectomy; Hemorrhoid surgery; Cataract extraction w/ intraocular lens  implant, bilateral (Bilateral, 2015); Abdominal hysterectomy (1987); Tonsillectomy; and Spine surgery (07/10/2017). Social History:   reports that she has never smoked. She has never used smokeless tobacco. She reports current alcohol use. She reports that she does not use drugs. Family History:  family history includes Arthritis in her maternal aunt; COPD in her maternal aunt and maternal grandmother; Deep vein thrombosis in her maternal grandfather and paternal grandmother; Diabetes in her father, paternal uncle, and sister; Heart attack in her father; Heart disease in her father, maternal grandmother, and paternal uncle; Hyperlipidemia in her father; Hypertension in her daughter, father, maternal aunt, paternal grandmother, and sister; Stroke in her father, maternal grandfather, mother, and paternal uncle; Transient ischemic attack in her  sister. Allergies:  is allergic to amlodipine, flexeril [cyclobenzaprine],  chlor-trimeton [chlorpheniramine], ace inhibitors, adhesive [tape], angiotensin receptor blockers, corn-containing products, iodine, and triamterene-hctz.   Medication Reconciliation: Current Outpatient Medications on File Prior to Visit  Medication Sig   Acetaminophen  (TYLENOL  PO) Take by mouth daily as needed.   Albuterol  Sulfate (PROAIR  RESPICLICK) 108 (90 Base) MCG/ACT AEPB Inhale 1-2 puffs into the lungs every 6 (six) hours as needed (for wheezing/shortness of breath).    allopurinol (ZYLOPRIM) 100 MG tablet Take 100 mg by mouth daily.   amoxicillin -clavulanate (AUGMENTIN ) 500-125 MG tablet Take 1 tablet by mouth 2 (two) times daily.   aspirin  EC 81 MG tablet Take 1 tablet (81 mg total) by mouth at bedtime.   azelastine (ASTELIN) 0.1 % nasal spray Place into both nostrils 2 (two) times daily. Use in each nostril as directed   ciclopirox  (PENLAC ) 8 % solution Apply topically at bedtime. Apply over nail and surrounding skin. Apply daily over previous coat. After seven (7) days, may remove with alcohol and continue cycle.   fluocinonide  cream (LIDEX ) 0.05 % APPLY CREAM EXTERNALLY TO AFFECTED AREA THREE TIMES DAILY FOR PSORIASIS FLARES DO NOT USE LONGER THAN 2 WEEKS   fluticasone  furoate-vilanterol (BREO ELLIPTA ) 200-25 MCG/INH AEPB Inhale 1 puff into the lungs daily.   Glucosamine-Chondroit-Vit C-Mn (GLUCOSAMINE 1500 COMPLEX) CAPS Take by mouth.   hydrALAZINE  (APRESOLINE ) 10 MG tablet Take 1 tablet (10 mg total) by mouth 3 (three) times daily.   loperamide (IMODIUM A-D) 2 MG tablet Take 2 mg by mouth 4 (four) times daily as needed for diarrhea or loose stools.   metoprolol  succinate (TOPROL -XL) 100 MG 24 hr tablet Take 1 tablet (100 mg total) by mouth daily.   montelukast  (SINGULAIR ) 10 MG tablet Take 10 mg by mouth at bedtime.    Multiple Vitamin (MULTIVITAMIN) tablet Take 1 tablet by mouth daily. Senior Multivitamin    nystatin  powder Apply 1 Application topically 3 (three) times daily.   omeprazole (PRILOSEC OTC) 20 MG tablet Take 20 mg by mouth daily.   rosuvastatin  (CRESTOR ) 5 MG tablet TAKE 1/2 (ONE-HALF) TABLET BY MOUTH IN THE EVENING   triamcinolone  cream (KENALOG ) 0.1 % Apply 1 application topically 2 (two) times daily as needed (for skin rash (Summertime)).    No current facility-administered medications on file prior to visit.  There are no discontinued medications.   Physical Exam:    06/14/2024    9:09 AM 04/27/2024    1:05 PM 04/04/2024   11:24 AM  Vitals with BMI  Height 5' 4 5' 4   Weight 174 lbs 10 oz 176 lbs 3 oz   BMI 29.96 30.23   Systolic 130 132 851  Diastolic 72 68 73  Pulse 72 65 59  Vital signs reviewed.  Nursing notes reviewed. Weight trend reviewed. Physical Activity: Inactive (03/23/2024)   Exercise Vital Sign    Days of Exercise per Week: 0 days    Minutes of Exercise per Session: 0 min   General Appearance:  No acute distress appreciable.   Well-groomed, healthy-appearing female.  Well proportioned with no abnormal fat distribution.  Good muscle tone. Pulmonary:  Normal work of breathing at rest, no respiratory distress apparent. SpO2: 98 %  Musculoskeletal: All extremities are intact.  Neurological:  Awake, alert, oriented, and engaged.  No obvious focal neurological deficits or cognitive impairments.  Sensorium seems unclouded.   Speech is clear and coherent with logical content. Psychiatric:  Appropriate mood, pleasant and cooperative demeanor, thoughtful and engaged during the exam   Verbalized to patient: Physical Exam  EXTREMITIES: Left calf circumference 13.6 inches, right calf 13.7 inches. Left thigh circumference 19.7 inches, right thigh 19.5 inches. Legs appear symmetrical except in images . Muscle tone symmetrical upon contraction. No cyanosis or edema.  Photographs Taken 06/14/2024 with chaperone Maire Fries present :         03/23/2024    8:34 AM  01/06/2024   11:44 AM 07/08/2023   10:20 AM 05/20/2023   11:51 AM  PHQ 2/9 Scores  PHQ - 2 Score 0 0 0 0   Office Visit on 04/27/2024  Component Date Value Ref Range Status   Color, UA 04/27/2024 dark yellow   Final   Clarity, UA 04/27/2024 cloudy   Final   Glucose, UA 04/27/2024 Negative  Negative Final   Bilirubin, UA 04/27/2024 negative   Final   Ketones, UA 04/27/2024 negative   Final   Spec Grav, UA 04/27/2024 1.020  1.010 - 1.025 Final   Blood, UA 04/27/2024 positive   Final   pH, UA 04/27/2024 6.0  5.0 - 8.0 Final   Protein, UA 04/27/2024 Positive (A)  Negative Final   Urobilinogen, UA 04/27/2024 0.2  0.2 or 1.0 E.U./dL Final   Nitrite, UA 88/80/7974 negative   Final   Leukocytes, UA 04/27/2024 Large (3+) (A)  Negative Final   Source: 04/27/2024 URINE   Final   Status: 04/27/2024 FINAL   Final   Isolate 1: 04/27/2024 Escherichia coli (A)   Final   Sodium 04/27/2024 141  135 - 145 mEq/L Final   Potassium 04/27/2024 4.8  3.5 - 5.1 mEq/L Final   Chloride 04/27/2024 111  96 - 112 mEq/L Final   CO2 04/27/2024 22  19 - 32 mEq/L Final   Glucose, Bld 04/27/2024 150 (H)  70 - 99 mg/dL Final   BUN 88/80/7974 35 (H)  6 - 23 mg/dL Final   Creatinine, Ser 04/27/2024 2.38 (H)  0.40 - 1.20 mg/dL Final   GFR 88/80/7974 18.47 (L)  >60.00 mL/min Final   Calcium  04/27/2024 9.5  8.4 - 10.5 mg/dL Final  Office Visit on 03/07/2024  Component Date Value Ref Range Status   SURGICAL PATHOLOGY 03/07/2024    Final-Edited                   Value:SURGICAL PATHOLOGY [facility] [address] [place], [place] [address] Telephone [phone] or [phone] Fax [phone]  REPORT OF DERMATOPATHOLOGY   Accession #: [id number] Patient Name: DISA, RIEDLINGER Visit # : [id number]  MRN: [id number] Cytotechnologist: Ephriam Rolla Edelman, Dermatopathologist, Electronic Signature DOB/Age September 14, 1940 (Age: 15) Gender: F Collected Date: 03/07/2024 Received Date:  03/07/2024  FINAL DIAGNOSIS       1. 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 There are scale-crust, irregular psoriasiform hyperplasia, fibroplasia, and telangiectases.  CASE COMMENTS STAINS USED IN DIAGNOSIS: H&E    CLINICAL HISTORY  SPECIMEN(S) OBTAINED 1. Skin, Left Ear  SPECIMEN COMMENTS: 1. 5mm pink s                         caly papule SPECIMEN CLINICAL INFORMATION: 1. Neoplasm of uncertain behavior of skin, R/O NMSC vs CNH    Gross Description 1. Formalin fixed specimen received:  7 X 5 X 1 MM, TOTO (3 P) (1  B) ( QB )        Report signed out from the following location(s) [facility] [address] [place], [place] [address] CLIA #: [id number]  [facility] [address] [place], [place] [address] CLIA #: [id number]   Office Visit on 01/06/2024  Component Date Value Ref Range Status   Hemoglobin A1C 01/06/2024 6.0 (A)  4.0 - 5.6 % Final  Office Visit on 07/08/2023  Component Date Value Ref Range Status   WBC 07/08/2023 7.1  4.0 - 10.5 K/uL Final   RBC 07/08/2023 3.42 (L)  3.87 - 5.11 Mil/uL Final   Hemoglobin 07/08/2023 11.2 (L)  12.0 - 15.0 g/dL Final   HCT 98/70/7974 33.8 (L)  36.0 - 46.0 % Final   MCV 07/08/2023 98.8  78.0 - 100.0 fl Final   MCHC 07/08/2023 33.2  30.0 - 36.0 g/dL Final   RDW 98/70/7974 14.0  11.5 - 15.5 % Final   Platelets 07/08/2023 191.0  150.0 - 400.0 K/uL Final   Neutrophils Relative % 07/08/2023 57.5  43.0 - 77.0 % Final   Lymphocytes Relative 07/08/2023 27.0  12.0 - 46.0 % Final   Monocytes Relative 07/08/2023 8.2  3.0 - 12.0 % Final   Eosinophils Relative 07/08/2023 6.6 (H)  0.0 - 5.0 % Final   Basophils Relative 07/08/2023 0.7  0.0 - 3.0 % Final   Neutro Abs 07/08/2023 4.1  1.4 -  7.7 K/uL Final   Lymphs Abs 07/08/2023 1.9  0.7 - 4.0 K/uL Final   Monocytes Absolute 07/08/2023 0.6  0.1 - 1.0 K/uL Final   Eosinophils Absolute 07/08/2023 0.5  0.0 - 0.7 K/uL Final   Basophils Absolute 07/08/2023 0.1  0.0 - 0.1 K/uL Final   Sodium 07/08/2023 141  135 - 145 mEq/L Final   Potassium 07/08/2023 4.2  3.5 - 5.1 mEq/L Final   Chloride 07/08/2023 110  96 - 112 mEq/L Final   CO2 07/08/2023 22  19 - 32 mEq/L Final   Glucose, Bld 07/08/2023 114 (H)  70 - 99 mg/dL Final   BUN 98/70/7974 38 (H)  6 - 23 mg/dL Final   Creatinine, Ser 07/08/2023 2.37 (H)  0.40 - 1.20 mg/dL Final   Total Bilirubin 07/08/2023 0.5  0.2 - 1.2 mg/dL Final   Alkaline Phosphatase 07/08/2023 85  39 - 117 U/L Final   AST 07/08/2023 19  0 - 37 U/L Final   ALT 07/08/2023 15  0 - 35 U/L Final   Total Protein 07/08/2023 6.9  6.0 - 8.3 g/dL Final   Albumin  07/08/2023 4.0  3.5 - 5.2 g/dL Final   GFR 98/70/7974 18.67 (L)  >60.00 mL/min Final   Calcium  07/08/2023 9.4  8.4 - 10.5 mg/dL Final   Cholesterol 98/70/7974 154  0 - 200 mg/dL Final   Triglycerides 98/70/7974 238.0 (H)  0.0 - 149.0 mg/dL Final   HDL 98/70/7974 41.20  >39.00 mg/dL Final   VLDL 98/70/7974 47.6 (H)  0.0 - 40.0 mg/dL Final   LDL Cholesterol 07/08/2023 65  0 - 99 mg/dL Final   Total CHOL/HDL Ratio 07/08/2023 4   Final   NonHDL 07/08/2023 112.84   Final   Hgb A1c MFr Bld 07/08/2023 6.4  4.6 - 6.5 % Final   TSH 07/08/2023 2.48  0.35 - 5.50 uIU/mL Final   Uric Acid, Serum 07/08/2023 6.3  2.4 - 7.0 mg/dL Final   Iron 98/70/7974 81  45 - 160 mcg/dL Final   TIBC 98/70/7974 297  250 - 450 mcg/dL (calc) Final   %SAT 98/70/7974 27  16 - 45 % (calc) Final   Ferritin 07/08/2023 55  16 - 288 ng/mL Final  Scanned Document on 07/21/2022  Component Date Value Ref Range Status   EGFR 07/09/2022 22.0   Final   Creatinine, POC 07/09/2022 197  mg/dL Final   Protein/Creatinine Ratio 07/09/2022 303   Final  Office Visit on 06/27/2022  Component Date Value Ref  Range Status   WBC 06/27/2022 7.0  4.0 - 10.5 K/uL Final   RBC 06/27/2022 3.81 (L)  3.87 - 5.11 Mil/uL Final   Hemoglobin 06/27/2022 12.2  12.0 - 15.0 g/dL Final   HCT 98/80/7975 36.8  36.0 - 46.0 % Final   MCV 06/27/2022 96.7  78.0 - 100.0 fl Final   MCHC 06/27/2022 33.2  30.0 - 36.0 g/dL Final   RDW 98/80/7975 13.7  11.5 - 15.5 % Final   Platelets 06/27/2022 227.0  150.0 - 400.0 K/uL Final   Neutrophils Relative % 06/27/2022 59.5  43.0 - 77.0 % Final   Lymphocytes Relative 06/27/2022 29.1  12.0 - 46.0 % Final   Monocytes Relative 06/27/2022 7.1  3.0 - 12.0 % Final   Eosinophils Relative 06/27/2022 3.6  0.0 - 5.0 % Final   Basophils Relative 06/27/2022 0.7  0.0 - 3.0 % Final   Neutro Abs 06/27/2022 4.2  1.4 - 7.7 K/uL Final   Lymphs Abs 06/27/2022 2.0  0.7 - 4.0 K/uL Final   Monocytes Absolute 06/27/2022 0.5  0.1 - 1.0 K/uL Final   Eosinophils Absolute 06/27/2022 0.3  0.0 - 0.7 K/uL Final   Basophils Absolute 06/27/2022 0.0  0.0 - 0.1 K/uL Final   Total Bilirubin 06/27/2022 0.6  0.2 - 1.2 mg/dL Final   Bilirubin, Direct 06/27/2022 0.1  0.0 - 0.3 mg/dL Final   Alkaline Phosphatase 06/27/2022 86  39 - 117 U/L Final   AST 06/27/2022 20  0 - 37 U/L Final   ALT 06/27/2022 14  0 - 35 U/L Final   Total Protein 06/27/2022 7.1  6.0 - 8.3 g/dL Final   Albumin  06/27/2022 4.2  3.5 - 5.2 g/dL Final   Sodium 98/80/7975 145  135 - 145 mEq/L Final   Potassium 06/27/2022 4.7  3.5 - 5.1 mEq/L Final   Chloride 06/27/2022 111  96 - 112 mEq/L Final   CO2 06/27/2022 23  19 - 32 mEq/L Final   Albumin  06/27/2022 4.2  3.5 - 5.2 g/dL Final   BUN 98/80/7975 45 (H)  6 - 23 mg/dL Final   Creatinine, Ser 06/27/2022 2.20 (H)  0.40 - 1.20 mg/dL Final   Glucose, Bld 98/80/7975 116 (H)  70 - 99 mg/dL Final   Phosphorus 98/80/7975 3.8  2.3 - 4.6 mg/dL Final   GFR 98/80/7975 20.57 (L)  >60.00 mL/min Final   Calcium  06/27/2022 9.8  8.4 - 10.5 mg/dL Final   TSH 98/80/7975 1.64  0.35 - 5.50 uIU/mL Final   Hgb A1c  MFr Bld 06/27/2022 6.2  4.6 - 6.5 % Final   Uric Acid, Serum 06/27/2022 6.9  2.4 - 7.0 mg/dL Final  No image results found. VAS US  LOWER EXTREMITY VENOUS (DVT) Result Date: 06/14/2024  Lower Venous DVT Study Patient Name:  Beonca Gibb Pemberton  Date of Exam:   06/14/2024 Medical Rec #: [id number]        Accession #:    [id number] Date of Birth: 08/03/1940       Patient Gender: F Patient Age:  83 years Exam Location:  [address] Procedure:      VAS US  LOWER EXTREMITY VENOUS (DVT) Referring Phys: Darwyn Ponzo --------------------------------------------------------------------------------  Indications: Posterior thigh pain.  Comparison Study: No previous exams Performing Technologist: Jody Hill RVT, RDMS  Examination Guidelines: A complete evaluation includes B-mode imaging, spectral Doppler, color Doppler, and power Doppler as needed of all accessible portions of each vessel. Bilateral testing is considered an integral part of a complete examination. Limited examinations for reoccurring indications may be performed as noted. The reflux portion of the exam is performed with the patient in reverse Trendelenburg.  +-----+---------------+---------+-----------+----------+--------------+ RIGHTCompressibilityPhasicitySpontaneityPropertiesThrombus Aging +-----+---------------+---------+-----------+----------+--------------+ CFV  Full           Yes      Yes                                 +-----+---------------+---------+-----------+----------+--------------+   +---------+---------------+---------+-----------+----------+--------------+ LEFT     CompressibilityPhasicitySpontaneityPropertiesThrombus Aging +---------+---------------+---------+-----------+----------+--------------+ CFV      Full           Yes      Yes                                 +---------+---------------+---------+-----------+----------+--------------+ SFJ      Full                                                         +---------+---------------+---------+-----------+----------+--------------+ FV Prox  Full           Yes      Yes                                 +---------+---------------+---------+-----------+----------+--------------+ FV Mid   Full           Yes      Yes                                 +---------+---------------+---------+-----------+----------+--------------+ FV DistalFull           Yes      Yes                                 +---------+---------------+---------+-----------+----------+--------------+ PFV      Full                                                        +---------+---------------+---------+-----------+----------+--------------+ POP      Full           Yes      Yes                                 +---------+---------------+---------+-----------+----------+--------------+ PTV      Full                                                        +---------+---------------+---------+-----------+----------+--------------+  PERO     Full                                                        +---------+---------------+---------+-----------+----------+--------------+     Summary: RIGHT: - No evidence of common femoral vein obstruction.   LEFT: - There is no evidence of deep vein thrombosis in the lower extremity.  - No cystic structure found in the popliteal fossa.  *See table(s) above for measurements and observations. Electronically signed by Lonni Gaskins MD on 06/14/2024 at 12:33:37 PM.    Final          ASSESSMENT & PLAN   Assessment & Plan Leg swelling Pain of left lower extremity Suspected acute DVT in the left lower extremity, likely due to prolonged sitting on a balled-up sweater. Symptoms include a deep ache from hip to knee, persistent for over two weeks. DVT is the primary concern due to the nature of the pain and slight localized swelling, though muscle pain or nerve involvement is considered. No history of blood clots or cancer, but  high risk due to age and chronic kidney disease. Imaging is pending to confirm the diagnosis. Ordered a stat ultrasound of the left lower extremity. Prescribed apixaban  2.5 mg twice daily, adjusted for age and kidney function, and advised starting it if the ultrasound is delayed or not performed today. Scheduled follow-up with Doctor Jodie in two weeks to reassess swelling and pain. Prescribed tramadol  for pain management as pain is severe and can't take nsaids, with caution regarding driving and sharing medication, and recommended gentle use of lidocaine  for pain relief. Advised wearing compression socks to alleviate pain and prevent clot enlargement.  Chronic kidney disease stage 4   CKD stage 4 affects medication dosing, particularly anticoagulants, requiring careful consideration to avoid further renal impairment. Adjusted apixaban  dosage to 2.5 mg twice daily due to CKD and age. Chronic kidney disease (CKD), active medical management without dialysis, stage 4 (severe) (HCC) CKD stage 4 affects medication dosing, particularly anticoagulants,  requiring careful consideration to avoid further renal impairment. Adjusted apixaban  dosage to 2.5 mg twice daily due to CKD and age. Also no NSAIDs to be used for pain  ORDER ASSOCIATIONS  #   DIAGNOSIS / CONDITION ICD-10 ENCOUNTER ORDER     ICD-10-CM   1. Leg swelling  M79.89 apixaban  (ELIQUIS ) 2.5 MG TABS tablet    VAS US  LOWER EXTREMITY VENOUS (DVT)    CANCELED: US  Venous Img Lower Unilateral Left (DVT)    CANCELED: US  Venous Img Lower Unilateral Left (DVT)    2. Pain of left lower extremity  M79.605 traMADol  (ULTRAM ) 50 MG tablet          Orders Placed in Encounter:   Imaging Orders         VAS US  LOWER EXTREMITY VENOUS (DVT)     Meds ordered this encounter  Medications   apixaban  (ELIQUIS ) 2.5 MG TABS tablet    Sig: Take 1 tablet (2.5 mg total) by mouth 2 (two) times daily.    Dispense:  60 tablet    Refill:  0   traMADol  (ULTRAM ) 50 MG  tablet    Sig: Take 1 tablet (50 mg total) by mouth every 8 (eight) hours as needed for up to 5 days.    Dispense:  15 tablet  Refill:  0    On the [place] of the visit, I dedicated 34 minutes to both direct and indirect patient care activities.  The time was spent: History: I obtained, documented, and reviewed a thorough medical history. I reviewed the patient's reported symptoms and clarified their context and significance in relation to the current visit. Examination: I conducted a medically appropriate physical evaluation. Data Synthesis: I synthesized information for clinical decision-making. Communication: I communicated clinical status and plan to the patient and/or family/caregiver. Counseling & Education: I provided personalized counseling on condition and treatment. Documentation: Documenting clinical findings and medical decision-making, and creating and providing documentation for patient review. Treatment Plan: I worked collaboratively with the patient to formulate and communicate an individualized plan (including shared decision-making). Orders: I placed necessary orders (medications, labs, imaging, referrals) in the EMR. Referrals and Communication: I referred the patient to other health care professionals as needed and communicated with them to ensure coordinated care.  This time was spent independently of any separately billable procedures. Please note that this statement is intended to provide a clear and comprehensive account of the time and services provided during the patient's visit.  The extended time spent was necessary to provide safe, effective, and comprehensive care due to the following factors:, Extensive Comorbidities: The patient's multiple chronic conditions necessitated careful coordination, monitoring, and integration of care plans., Critical Decision Making was necessary.  In-depth discussion of possible hospitalization and/or major surgery based on the patient's  health status and preferences. , and Complex Medication Education Requested by Patient: The patient requested more detailed teaching about their new medication regimen-including dosing, side effects, monitoring-which required extended education time not reflected in standard medication counseling.   Managing possibility of apixaban  while awaiting deep vein thrombosis confirmation was complex. Also managing pain management with chronic kidney disease 4 was complex  Medical Decision Making: 1 undiagnosed new problem with uncertain prognosis Prescription drug management  Decision regarding minor surgery with identified patient or procedure risk factors   not surgery but stat deep vein thrombosis rule out imaging same [place] to hospital with patient risk factor.      This document was synthesized by artificial intelligence ([employer]bridge) using HIPAA-compliant recording of the clinical interaction;   We discussed the use of AI scribe software for clinical note transcription with the patient, who gave verbal consent to proceed. additional Info: This encounter employed state-of-the-art, real-time, collaborative documentation. The patient actively reviewed and assisted in updating their electronic medical record on a shared screen, ensuring transparency and facilitating joint problem-solving for the problem list, overview, and plan. This approach promotes accurate, informed care. The treatment plan was discussed and reviewed in detail, including medication safety, potential side effects, and all patient questions. We confirmed understanding and comfort with the plan. Follow-up instructions were established, including contacting the office for any concerns, returning if symptoms worsen, persist, or new symptoms develop, and precautions for potential emergency department visits.

## 2024-06-14 NOTE — Patient Instructions (Signed)
 It was a pleasure seeing you today! Your health and satisfaction are our top priorities.  Sabrina Cone, MD  VISIT SUMMARY: Today, we discussed the deep ache you have been experiencing in your left thigh for over two weeks. We reviewed your symptoms, including the pain's location and characteristics, and considered possible causes. We also discussed your chronic kidney disease and how it affects your treatment plan.  YOUR PLAN: -ACUTE DEEP VEIN THROMBOSIS (DVT) OF LEFT LOWER EXTREMITY: DVT is a condition where a blood clot forms in a deep vein, usually in the legs. This can cause pain, swelling, and other symptoms. We suspect DVT due to your symptoms and have ordered an urgent ultrasound to confirm the diagnosis. In the meantime, you are prescribed apixaban  2.5 mg twice daily to prevent clotting, especially if the ultrasound is delayed. For pain, you are prescribed tramadol , and you can also use lidocaine  gently. Wearing compression socks is recommended to help with pain and prevent the clot from getting bigger.  -CHRONIC KIDNEY DISEASE STAGE 4: Chronic kidney disease (CKD) stage 4 means your kidneys are significantly impaired, which affects how your body handles certain medications. We have adjusted the dosage of apixaban  to 2.5 mg twice daily to account for your kidney function and age.  INSTRUCTIONS: Please get the ultrasound of your left leg as soon as possible. Start taking apixaban  2.5 mg twice daily if the ultrasound is delayed or not done today. Follow up with Doctor Jodie in two weeks to reassess your swelling and pain. Use tramadol  for pain management as prescribed, and be cautious about driving and sharing this medication. You can also use lidocaine  gently for pain relief. Wear compression socks to help with pain and prevent the clot from enlarging.  Your Providers PCP: Jodie Lavern CROME, MD,  [phone]) Referring Provider: Jodie Lavern CROME, MD,  [phone]) Care Team Provider: Frutoso Luz, MD,  [phone]) Care Team Provider: Lanis Pupa, MD,  [phone]) Care Team Provider: Sharalyn Browning Care Team Provider: Porter Andrez SAUNDERS, PA-C Care Team Provider: Macel Jayson PARAS, MD,  [phone])  NEXT STEPS: [x]  Early Intervention: Schedule sooner appointment, call our on-call services, or go to emergency room if there is any significant Increase in pain or discomfort New or worsening symptoms Sudden or severe changes in your health [x]  Flexible Follow-Up: We recommend a Return in about 2 weeks (around 06/28/2024) for close follow up acute illness see Dr. Vernell for leg swelling/pain ?dvt. for optimal routine care. This allows for progress monitoring and treatment adjustments. [x]  Preventive Care: Schedule your annual preventive care visit! It's typically covered by insurance and helps identify potential health issues early. [x]  Lab & X-ray Appointments: Incomplete tests scheduled today, or call to schedule. X-rays: [facility] (M-F, 8:30am-noon or 1pm-5pm). [x]  Medical Information Release: Sign a release form at front desk to obtain relevant medical information we don't have.  MAKING THE MOST OF OUR FOCUSED 20 MINUTE APPOINTMENTS: [x]   Clearly state your top concerns at the beginning of the visit to focus our discussion [x]   If you anticipate you will need more time, please inform the front desk during scheduling - we can book multiple appointments in the same week. [x]   If you have transportation problems- use our convenient video appointments or ask about transportation support. [x]   We can get down to business faster if you use MyChart to update information before the visit and submit non-urgent questions before your visit. Thank you for taking the time to provide  details through MyChart.  Let our nurse know and she can import this information into your encounter documents.  Arrival and Wait Times: [x]   Arriving on time ensures that  everyone receives prompt attention. [x]   Early morning (8a) and afternoon (1p) appointments tend to have shortest wait times. [x]   Unfortunately, we cannot delay appointments for late arrivals or hold slots during phone calls.  Getting Answers and Following Up [x]   Simple Questions & Concerns: For quick questions or basic follow-up after your visit, reach us  at [phone] or MyChart messaging. [x]   Complex Concerns: If your concern is more complex, scheduling an appointment might be best. Discuss this with the staff to find the most suitable option. [x]   Lab & Imaging Results: We'll contact you directly if results are abnormal or you don't use MyChart. Most normal results will be on MyChart within 2-3 business days, with a review message from Dr. Jesus. Haven't heard back in 2 weeks? Need results sooner? Contact us  at [phone]. [x]   Referrals: Our referral coordinator will manage specialist referrals. The specialist's office should contact you within 2 weeks to schedule an appointment. Call us  if you haven't heard from them after 2 weeks.  Staying Connected [x]   MyChart: Activate your MyChart for the fastest way to access results and message us . See the last page of this paperwork for instructions on how to activate.  Bring to Your Next Appointment [x]   Medications: Please bring all your medication bottles to your next appointment to ensure we have an accurate record of your prescriptions. [x]   Health Diaries: If you're monitoring any health conditions at home, keeping a diary of your readings can be very helpful for discussions at your next appointment.  Billing [x]   X-ray & Lab Orders: These are billed by separate companies. Contact the invoicing company directly for questions or concerns. [x]   Visit Charges: Discuss any billing inquiries with our administrative services team.  Your Satisfaction Matters [x]   Share Your Experience: We strive for your satisfaction! If you have any  complaints, or preferably compliments, please let Dr. Jesus know directly or contact our Practice Administrators, Manuelita Rubin or [employer], by asking at the front desk.   Reviewing Your Records [x]   Review this early draft of your clinical encounter notes below and the final encounter summary tomorrow on MyChart after its been completed.  All orders placed so far are visible here: Leg swelling -     Apixaban ; Take 1 tablet (2.5 mg total) by mouth 2 (two) times daily.  Dispense: 60 tablet; Refill: 0 -     VAS US  LOWER EXTREMITY VENOUS (DVT); Future  Pain of left lower extremity -     traMADol  HCl; Take 1 tablet (50 mg total) by mouth every 8 (eight) hours as needed for up to 5 days.  Dispense: 15 tablet; Refill: 0

## 2024-06-14 NOTE — Telephone Encounter (Signed)
 FYI Only or Action Required?: Action required by provider: clinical question for provider and update on patient condition.  Patient was last seen in primary care on 06/14/2024 by Sabrina Bernardino MATSU, MD.  Called Nurse Triage reporting Advice Only.   Triage Disposition: No Contact Calls  Patient/caregiver understands and will follow disposition?: Yes   Message from Sabrina Ramirez sent at 06/14/2024  1:10 PM EST  Reason for Triage: Patients son Sabrina Ramirez like to know if patient should take blood thinner apixaban  (ELIQUIS ) 2.5 MG TABS tablet even though ultrasound showed no blood and should she be taking anti-inflammatory. Please call [phone]     Reason for Disposition  Message left on identified voice mail  Answer Assessment - Initial Assessment Questions Attempted to contact pt but NA. Pt did have identified VM with full name and left detailed message that Eliquis  was to be taken in event that U/S was delayed and not performed today, recommended she hold rx at this time. U/S performed today. Provided pt with clinic contact number for further questions or concerns.   06/14/24 Per chart, Dr. [name] Prescribed apixaban  2.5 mg twice daily, adjusted for age and kidney function, and advised starting it if the ultrasound is delayed or not performed today.   U/S performed today  Tramadol  PRN for pain  Protocols used: No Contact or Duplicate Contact Call-A-AH

## 2024-06-14 NOTE — Telephone Encounter (Signed)
 Patient's son, Alm (on [place]) is calling back in. Patient had ultrasound completed today and no blood clot was found. Son would like provider's input on whether or not patient should still take the prescribed Eliquis , as blood clot has been ruled out. Please advise.  Son would also like provider's input on what else could be causing the symptoms, now that blood clot has been ruled out, and whether an anti-inflammatory medication would be beneficial. Please advise.   Secondary call back number is [phone] [place]).

## 2024-06-14 NOTE — Telephone Encounter (Signed)
 Appt today

## 2024-06-29 ENCOUNTER — Ambulatory Visit: Admitting: Family Medicine

## 2024-06-29 ENCOUNTER — Encounter: Payer: Self-pay | Admitting: Family Medicine

## 2024-06-29 VITALS — BP 142/64 | HR 66 | Temp 97.9°F | Ht 64.0 in | Wt 172.0 lb

## 2024-06-29 DIAGNOSIS — L409 Psoriasis, unspecified: Secondary | ICD-10-CM

## 2024-06-29 DIAGNOSIS — M150 Primary generalized (osteo)arthritis: Secondary | ICD-10-CM | POA: Diagnosis not present

## 2024-06-29 DIAGNOSIS — M5432 Sciatica, left side: Secondary | ICD-10-CM | POA: Diagnosis not present

## 2024-06-29 DIAGNOSIS — R2681 Unsteadiness on feet: Secondary | ICD-10-CM | POA: Diagnosis not present

## 2024-06-29 MED ORDER — PREDNISONE 10 MG PO TABS: ORAL_TABLET | ORAL | 0 refills | Status: AC

## 2024-06-29 NOTE — Progress Notes (Signed)
 "  Subjective  CC:  Chief Complaint  Patient presents with   Knee Pain    Says she has pain in knee, hip, and leg    HPI: Sabrina Ramirez is a 84 y.o. female who presents to the office today to address the problems listed above in the chief complaint. Discussed the use of AI scribe software for clinical note transcription with the patient, who gave verbal consent to proceed.  History of Present Illness Sabrina Ramirez is an 84 year old female who presents with a deep ache in her left leg. I reviewed notes from January 6, evaluated for leg pain, ruled out DVT with Doppler and treated with tramadol .  However pain persists Left leg pain - Deep, bone-deep ache in the left leg for approximately one month - Pain originates near the left hip and radiates down the posterior-lateral aspect of the leg to knee - Exacerbated by sitting and standing, with sitting being more painful - Does not disturb sleep at night - Occasionally pain extends to the foot, uncertain if related - No numbness or tingling - Pain described as dull and boring, not shooting - No previous episodes of similar pain - No swelling in the leg - No significant paresthesias or weakness.  Response to analgesics - Trial of tramadol  for pain relief, two doses taken - No significant relief from tramadol   Impaired grip strength - Decreased grip strength in the hands - Dropping items due to lack of strength - Initiated hand strengthening exercises  Dermatologic lesion/psoriasis - History of a slow-healing spot on the ear - Received a sample medication from dermatologist with improvement, request refill   Assessment  1. Left sided sciatica   2. Primary osteoarthritis involving multiple joints   3. Unsteady gait   4. Psoriasis      Plan  Assessment and Plan Assessment & Plan Sciatica Chronic deep ache in the left leg, starting from the hip and running down to the knee, exacerbated by sitting and  relieved by lying down. No numbness or tingling. Likely due to sciatic nerve impingement, possibly exacerbated by sitting posture and loss of fat pads. Examination suggests nerve involvement. - Prescribed prednisone  to reduce inflammation around the sciatic nerve. - Recommended physical therapy to address sciatica symptoms. - Avoid gabapentin due to potential drowsiness and lack of sleep issues, defer for now but can add this if needed.  No red flag symptoms.  Psoriasis Slow healing of a spot on the ear. Dermatologist provided a sample of a new treatment for psoriasis, which has shown improvement in healing. - Ordered prescription for the new psoriasis treatment as recommended by the dermatologist.  Bilateral hand osteoarthritis Decreased grip strength and occasional dropping of objects. Visible joint enlargement and bending. No pain reported, but weakness noted. - Recommended hand strengthening exercises using small weights or household items. - Advised use of Tylenol  for any pain management if needed.  Unsteady gait: Discussed possible need for rollator per patient request.    Follow up: As scheduled for complete physical Orders Placed This Encounter  Procedures   Ambulatory referral to Physical Therapy   Meds ordered this encounter  Medications   predniSONE  (DELTASONE ) 10 MG tablet    Sig: Take 4 tabs qd x 2 days, 3 qd x 2 days, 2 qd x 2d, 1qd x 3 days    Dispense:  21 tablet    Refill:  0     I reviewed the patients updated PMH, FH,  and SocHx.  Patient Active Problem List   Diagnosis Date Noted   Anemia associated with stage 4 chronic renal failure (HCC) 09/25/2021    Priority: High   Chronic kidney disease, stage 4 (severe) (HCC) 09/24/2021    Priority: High   IFG (impaired fasting glucose) 03/09/2019    Priority: High   Obesity, Class I, BMI 30-34.9 04/16/2015    Priority: High   Hypertriglyceridemia 09/22/2011    Priority: High   Essential hypertension 05/23/2010     Priority: High   Gout due to renal impairment 05/12/2022    Priority: Medium    Nephrolithiasis 03/22/2021    Priority: Medium    Localized primary osteoarthritis of carpometacarpal joint of left thumb 03/08/2019    Priority: Medium    On statin therapy due to risk of future cardiovascular event 12/01/2017    Priority: Medium    Spondylolisthesis at L5-S1 level 08/13/2017    Priority: Medium    Colon polyp 05/25/2017    Priority: Medium    Ophthalmic migraine 05/25/2017    Priority: Medium    Essential tremor 05/25/2017    Priority: Medium    Allergic asthma, mild persistent, uncomplicated 05/25/2017    Priority: Medium    Irritable bowel syndrome with diarrhea 07/08/2010    Priority: Medium    Chronic allergic rhinitis 02/25/2010    Priority: Medium    GERD (gastroesophageal reflux disease) 02/25/2010    Priority: Medium    Osteoarthritis, multiple sites 06/12/2009    Priority: Medium    Female pattern hair loss 11/24/2022    Priority: Low   Sensorineural hearing loss (SNHL) of both ears 06/27/2022    Priority: Low   Allergic rhinitis due to pollen 01/24/2022    Priority: Low   Conjunctivitis, allergic, chronic 10/01/2012    Priority: Low   Rosacea 06/25/2011    Priority: Low   Psoriasis 02/25/2010    Priority: Low   Active Medications[1] Allergies: Patient is allergic to amlodipine, flexeril [cyclobenzaprine], ace inhibitors, adhesive [tape], angiotensin receptor blockers, chlor-trimeton [chlorpheniramine], corn-containing products, iodine, and triamterene-hctz. Family History: Patient family history includes Arthritis in her maternal aunt; COPD in her maternal aunt and maternal grandmother; Deep vein thrombosis in her maternal grandfather and paternal grandmother; Diabetes in her father, paternal uncle, and sister; Heart attack in her father; Heart disease in her father, maternal grandmother, and paternal uncle; Hyperlipidemia in her father; Hypertension in her  daughter, father, maternal aunt, paternal grandmother, and sister; Stroke in her father, maternal grandfather, mother, and paternal uncle; Transient ischemic attack in her sister. Social History:  Patient  reports that she has never smoked. She has never used smokeless tobacco. She reports current alcohol use. She reports that she does not use drugs.  Review of Systems: Constitutional: Negative for fever malaise or anorexia Cardiovascular: negative for chest pain Respiratory: negative for SOB or persistent cough Gastrointestinal: negative for abdominal pain  Objective  Vitals: BP (!) 142/64   Pulse 66   Temp 97.9 F (36.6 C) (Temporal)   Ht 5' 4 (1.626 m)   Wt 172 lb (78 kg)   SpO2 99%   BMI 29.52 kg/m  General: no acute distress , A&Ox3 HEENT: PEERL, conjunctiva normal, neck is supple Left-sided nontender, nontender hamstring, full range of motion at hip and knee, negative straight leg raise bilaterally.  Normal strength throughout lower extremities.  Normal DTRs. Commons side effects, risks, benefits, and alternatives for medications and treatment plan prescribed today were discussed, and the patient expressed  understanding of the given instructions. Patient is instructed to call or message via MyChart if he/she has any questions or concerns regarding our treatment plan. No barriers to understanding were identified. We discussed Red Flag symptoms and signs in detail. Patient expressed understanding regarding what to do in case of urgent or emergency type symptoms.  Medication list was reconciled, printed and provided to the patient in AVS. Patient instructions and summary information was reviewed with the patient as documented in the AVS. This note was prepared with assistance of Dragon voice recognition software. Occasional wrong-word or sound-a-like substitutions may have occurred due to the inherent limitations of voice recognition software    [1]  Current Meds  Medication Sig    Acetaminophen  (TYLENOL  PO) Take by mouth daily as needed.   Albuterol  Sulfate (PROAIR  RESPICLICK) 108 (90 Base) MCG/ACT AEPB Inhale 1-2 puffs into the lungs every 6 (six) hours as needed (for wheezing/shortness of breath).    allopurinol (ZYLOPRIM) 100 MG tablet Take 100 mg by mouth daily.   apixaban  (ELIQUIS ) 2.5 MG TABS tablet Take 1 tablet (2.5 mg total) by mouth 2 (two) times daily.   aspirin  EC 81 MG tablet Take 1 tablet (81 mg total) by mouth at bedtime.   azelastine (ASTELIN) 0.1 % nasal spray Place into both nostrils 2 (two) times daily. Use in each nostril as directed   ciclopirox  (PENLAC ) 8 % solution Apply topically at bedtime. Apply over nail and surrounding skin. Apply daily over previous coat. After seven (7) days, may remove with alcohol and continue cycle.   fluocinonide  cream (LIDEX ) 0.05 % APPLY CREAM EXTERNALLY TO AFFECTED AREA THREE TIMES DAILY FOR PSORIASIS FLARES DO NOT USE LONGER THAN 2 WEEKS   fluticasone  furoate-vilanterol (BREO ELLIPTA ) 200-25 MCG/INH AEPB Inhale 1 puff into the lungs daily.   Glucosamine-Chondroit-Vit C-Mn (GLUCOSAMINE 1500 COMPLEX) CAPS Take by mouth.   hydrALAZINE  (APRESOLINE ) 10 MG tablet Take 1 tablet (10 mg total) by mouth 3 (three) times daily.   loperamide (IMODIUM A-D) 2 MG tablet Take 2 mg by mouth 4 (four) times daily as needed for diarrhea or loose stools.   metoprolol  succinate (TOPROL -XL) 100 MG 24 hr tablet Take 1 tablet (100 mg total) by mouth daily.   montelukast  (SINGULAIR ) 10 MG tablet Take 10 mg by mouth at bedtime.    Multiple Vitamin (MULTIVITAMIN) tablet Take 1 tablet by mouth daily. Senior Multivitamin   nystatin  powder Apply 1 Application topically 3 (three) times daily.   omeprazole (PRILOSEC OTC) 20 MG tablet Take 20 mg by mouth daily.   predniSONE  (DELTASONE ) 10 MG tablet Take 4 tabs qd x 2 days, 3 qd x 2 days, 2 qd x 2d, 1qd x 3 days   Roflumilast (ZORYVE) 0.3 % CREA Apply 1 application  topically in the morning and at bedtime.    rosuvastatin  (CRESTOR ) 5 MG tablet TAKE 1/2 (ONE-HALF) TABLET BY MOUTH IN THE EVENING   triamcinolone  cream (KENALOG ) 0.1 % Apply 1 application topically 2 (two) times daily as needed (for skin rash (Summertime)).    "

## 2024-07-08 ENCOUNTER — Ambulatory Visit: Admitting: Family Medicine

## 2024-07-08 ENCOUNTER — Encounter: Payer: Self-pay | Admitting: Family Medicine

## 2024-07-08 VITALS — BP 112/66 | HR 54 | Temp 97.7°F | Ht 64.0 in | Wt 174.2 lb

## 2024-07-08 DIAGNOSIS — I10 Essential (primary) hypertension: Secondary | ICD-10-CM

## 2024-07-08 DIAGNOSIS — M1030 Gout due to renal impairment, unspecified site: Secondary | ICD-10-CM

## 2024-07-08 DIAGNOSIS — Z0001 Encounter for general adult medical examination with abnormal findings: Secondary | ICD-10-CM

## 2024-07-08 DIAGNOSIS — E781 Pure hyperglyceridemia: Secondary | ICD-10-CM

## 2024-07-08 DIAGNOSIS — R7301 Impaired fasting glucose: Secondary | ICD-10-CM

## 2024-07-08 DIAGNOSIS — D631 Anemia in chronic kidney disease: Secondary | ICD-10-CM

## 2024-07-08 DIAGNOSIS — N184 Chronic kidney disease, stage 4 (severe): Secondary | ICD-10-CM

## 2024-07-08 LAB — CBC WITH DIFFERENTIAL/PLATELET
Basophils Absolute: 0 10*3/uL (ref 0.0–0.1)
Basophils Relative: 0.1 % (ref 0.0–3.0)
Eosinophils Absolute: 0 10*3/uL (ref 0.0–0.7)
Eosinophils Relative: 0.3 % (ref 0.0–5.0)
HCT: 37.8 % (ref 36.0–46.0)
Hemoglobin: 12.4 g/dL (ref 12.0–15.0)
Lymphocytes Relative: 13.6 % (ref 12.0–46.0)
Lymphs Abs: 1.6 10*3/uL (ref 0.7–4.0)
MCHC: 32.9 g/dL (ref 30.0–36.0)
MCV: 97.4 fl (ref 78.0–100.0)
Monocytes Absolute: 1 10*3/uL (ref 0.1–1.0)
Monocytes Relative: 7.9 % (ref 3.0–12.0)
Neutro Abs: 9.4 10*3/uL — ABNORMAL HIGH (ref 1.4–7.7)
Neutrophils Relative %: 78.1 % — ABNORMAL HIGH (ref 43.0–77.0)
Platelets: 215 10*3/uL (ref 150.0–400.0)
RBC: 3.88 Mil/uL (ref 3.87–5.11)
RDW: 13.7 % (ref 11.5–15.5)
WBC: 12 10*3/uL — ABNORMAL HIGH (ref 4.0–10.5)

## 2024-07-08 LAB — LIPID PANEL
Cholesterol: 198 mg/dL (ref 28–200)
HDL: 51.3 mg/dL
LDL Cholesterol: 95 mg/dL (ref 10–99)
NonHDL: 146.56
Total CHOL/HDL Ratio: 4
Triglycerides: 257 mg/dL — ABNORMAL HIGH (ref 10.0–149.0)
VLDL: 51.4 mg/dL — ABNORMAL HIGH (ref 0.0–40.0)

## 2024-07-08 LAB — COMPREHENSIVE METABOLIC PANEL WITH GFR
ALT: 15 U/L (ref 3–35)
AST: 16 U/L (ref 5–37)
Albumin: 4 g/dL (ref 3.5–5.2)
Alkaline Phosphatase: 77 U/L (ref 39–117)
BUN: 56 mg/dL — ABNORMAL HIGH (ref 6–23)
CO2: 23 meq/L (ref 19–32)
Calcium: 9.5 mg/dL (ref 8.4–10.5)
Chloride: 109 meq/L (ref 96–112)
Creatinine, Ser: 2.28 mg/dL — ABNORMAL HIGH (ref 0.40–1.20)
GFR: 19.42 mL/min — ABNORMAL LOW
Glucose, Bld: 107 mg/dL — ABNORMAL HIGH (ref 70–99)
Potassium: 4.8 meq/L (ref 3.5–5.1)
Sodium: 141 meq/L (ref 135–145)
Total Bilirubin: 0.5 mg/dL (ref 0.2–1.2)
Total Protein: 7.2 g/dL (ref 6.0–8.3)

## 2024-07-08 LAB — TSH: TSH: 1.22 u[IU]/mL (ref 0.35–5.50)

## 2024-07-08 LAB — URIC ACID: Uric Acid, Serum: 6.1 mg/dL (ref 2.4–7.0)

## 2024-07-08 LAB — HEMOGLOBIN A1C: Hgb A1c MFr Bld: 6.3 % (ref 4.6–6.5)

## 2024-07-08 MED ORDER — GABAPENTIN 100 MG PO CAPS: 100.0000 mg | ORAL_CAPSULE | Freq: Two times a day (BID) | ORAL | 0 refills | Status: AC | PRN

## 2024-07-09 ENCOUNTER — Ambulatory Visit: Payer: Self-pay | Admitting: Family Medicine

## 2024-07-09 LAB — IRON,TIBC AND FERRITIN PANEL
%SAT: 32 % (ref 16–45)
Ferritin: 56 ng/mL (ref 16–288)
Iron: 108 ug/dL (ref 45–160)
TIBC: 333 ug/dL (ref 250–450)

## 2024-07-09 NOTE — Progress Notes (Signed)
 See mychart note Dear Ms. Kropp, It was good seeing you.  Your lab results are stable. Your WBC is elevated due to the prednisone  and will resolve once out of the system.  Your sugar test remains in the prediabetic range; your cholesterol is slightly higher.  Do the best you can with your diet. No medication changes are needed at this time.  I am hopeful we can get your sciatica pain calmed down.  Sincerely, Dr. Jodie

## 2024-07-09 NOTE — Progress Notes (Signed)
 " Subjective  Chief Complaint  Patient presents with   Annual Exam    Pt here for Annual Exam and is not currenly fasting    Hypertension    IFG (impaired fasting glucose)     HPI: Sabrina Ramirez is a 84 y.o. female who presents to [facility] [place] today for a Female Wellness Visit. She also has the concerns and/or needs as listed above in the chief complaint. These will be addressed in addition to the Health Maintenance Visit.   Wellness Visit: annual visit with health maintenance review and exam  HM: no longer warrants screens. Imms are current. Overall doing well  Chronic disease f/u and/or acute problem visit: (deemed necessary to be done in addition to the wellness visit): Discussed the use of AI scribe software for clinical note transcription with the patient, who gave verbal consent to proceed.  History of Present Illness Sabrina Ramirez Seta is an 84 year old female who presents for follow up chronic   Sciatica pain persists despite current medication regimen. - Currently taking prednisone , with today being the last dose. - Slight improvement in pain with prednisone , but pain continues. - Takes tramadol  for pain relief and inquires about compatibility with prednisone . - Pain worsens with standing or sitting, less severe at night. No new sxs; see last note.  HLD and hypertriglycerides on statin. Due for recheck. Non-fasting today. No side effects. Eats low fat  HTN: Feeling well. Taking medications w/o adverse effects. No symptoms of CHF, angina; no palpitations, sob, cp or lower extremity edema. Compliant with meds.  Bp elevated today: in pain and had to drive in ice to get here today. Hasn't checked at home but recent renal visit bp was excellent. I reviewed note  CKD: reviewed recent nephrologhy note. No changes made. CKD 4 is stable. No sxs.  Anemia due to CKD: due for recheck. No sxs of fatigue or sob. Not on oral iron nor areanesp  H/o  gout w/o recent flares  IFG: no sxs of hyperglycemia.      Assessment  1. Encounter for well adult exam with abnormal findings   2. Chronic kidney disease, stage 4 (severe) (HCC)   3. Anemia associated with stage 4 chronic renal failure (HCC)   4. Essential hypertension   5. Hypertriglyceridemia   6. IFG (impaired fasting glucose)   7. Gout due to renal impairment, unspecified chronicity, unspecified site      Plan  Female Wellness Visit: Age appropriate Health Maintenance and Prevention measures were discussed with patient. Included topics are cancer screening recommendations, ways to keep healthy (see AVS) including dietary and exercise recommendations, regular eye and dental care, use of seat belts, and avoidance of moderate alcohol use and tobacco use.  BMI: discussed patient's BMI and encouraged positive lifestyle modifications to help get to or maintain a target BMI. HM needs and immunizations were addressed and ordered. See below for orders. See HM and immunization section for updates. Routine labs and screening tests ordered including cmp, cbc and lipids where appropriate. Discussed recommendations regarding Vit D and calcium  supplementation (see AVS)  Chronic disease management visit and/or acute problem visit: Assessment and Plan Assessment & Plan Left sided sciatica Persistent left-sided sciatica with some improvement after prednisone . Pain persists, especially when standing or sitting. Gabapentin  discussed for nerve pain management, with potential side effects of drowsiness and feeling loopy. Physical therapy recommended but not yet initiated. - Continue prednisone  as prescribed. - Start gabapentin   100 mg at night, increase to 300 mg at night if tolerated, then consider 100 mg twice daily if needed. - Instructed to inquire about physical therapy initiation once weather clears. - Use tramadol  or acetaminophen  for pain management as needed.  Chronic kidney disease, stage  4 Recent kidney function tests indicate well-managed kidney function with a favorable prognosis. No immediate need for dialysis. Discussion with nephrologist about managing kidney disease and educational resources provided. - Continue current management as per nephrologist's recommendations. - Reviewed recent blood work results to ensure no overlap with current medications.  Essential hypertension Blood pressure elevated today, likely due to pain and stress from driving on ice. No changes to antihypertensive regimen at this time due to recent prednisone  use and pain. - Monitor blood pressure at home once pain levels decrease. - Report home blood pressure readings in a week or two.  HLD and elevated trigs: non fasting lipids checked today on statin. Eat low fat Check lfts  Anemia due to renal dysfunction; recheck today and monitor. Asypmtomatic  Gout: monitor uric acid on allopurinol    Follow up: 6 mo for recheck  Orders Placed This Encounter  Procedures   CBC with Differential/Platelet   Comprehensive metabolic panel with GFR   Lipid panel   Hemoglobin A1c   TSH   Iron, TIBC and Ferritin Panel   Uric acid   Meds ordered this encounter  Medications   gabapentin  (NEURONTIN ) 100 MG capsule    Sig: Take 1-3 capsules (100-300 mg total) by mouth 2 (two) times daily as needed.    Dispense:  90 capsule    Refill:  0      Body mass index is 29.9 kg/m. Wt Readings from Last 3 Encounters:  07/08/24 174 lb 3.2 oz (79 kg)  06/29/24 172 lb (78 kg)  06/14/24 174 lb 9.6 oz (79.2 kg)     Patient Active Problem List   Diagnosis Date Noted Date Diagnosed   Anemia associated with stage 4 chronic renal failure (HCC) 09/25/2021     Priority: High   Chronic kidney disease, stage 4 (severe) (HCC) 09/24/2021     Priority: High    Nl SPEP,UPEP and mild cortical thinning on renal ultrasound 2014 [facility] 2023    IFG (impaired fasting glucose) 03/09/2019     Priority: High     a1c 6.3 02/2019. 6.2 06/2022    Obesity, Class I, BMI 30-34.9 04/16/2015     Priority: High   Hypertriglyceridemia 09/22/2011     Priority: High   Essential hypertension 05/23/2010     Priority: High    She has had difficulties with multiple antihypertensives including ACE, ARB, diuretics. She had what she noted to be intolerable swelling with amlodipine.  Treated with metoprolol  and hydralazine     Gout due to renal impairment 05/12/2022     Priority: Medium    Nephrolithiasis 03/22/2021     Priority: Medium     Right ureteral stone; [facility] 2022    Localized primary osteoarthritis of carpometacarpal joint of left thumb 03/08/2019     Priority: Medium    On statin therapy due to risk of future cardiovascular event 12/01/2017     Priority: Medium    Spondylolisthesis at L5-S1 level 08/13/2017     Priority: Medium    Colon polyp 05/25/2017     Priority: Medium     Overview:  1968, none on subsequent colonoscopy, [facility], last 2012    Ophthalmic migraine 05/25/2017  Priority: Medium    Essential tremor 05/25/2017     Priority: Medium    Allergic asthma, mild persistent, uncomplicated 05/25/2017     Priority: Medium     Dr. Frutoso, allergist    Irritable bowel syndrome with diarrhea 07/08/2010     Priority: Medium    Chronic allergic rhinitis 02/25/2010     Priority: Medium    GERD (gastroesophageal reflux disease) 02/25/2010     Priority: Medium     Overview:  On chronic PPI since early 2000s. Neg H.pylor    Osteoarthritis, multiple sites 06/12/2009     Priority: Medium    Female pattern hair loss 11/24/2022     Priority: Low   Sensorineural hearing loss (SNHL) of both ears 06/27/2022     Priority: Low   Allergic rhinitis due to pollen 01/24/2022 01/24/2022    Priority: Low   Conjunctivitis, allergic, chronic 10/01/2012     Priority: Low   Rosacea 06/25/2011     Priority: Low   Psoriasis 02/25/2010     Priority: Low   Health Maintenance  Topic Date Due    COVID-19 Vaccine (6 - 2025-26 season) 07/24/2024 (Originally 02/08/2024)   Medicare Annual Wellness (AWV)  03/23/2025   DTaP/Tdap/Td (3 - Td or Tdap) 01/25/2032   Pneumococcal Vaccine: 50+ Years  Completed   Influenza Vaccine  Completed   Bone Density Scan  Completed   Zoster Vaccines- Shingrix  Completed   Meningococcal B Vaccine  Aged Out   Mammogram  Discontinued   Immunization History  Administered Date(s) Administered   Fluad Quad(high Dose 65+) 03/06/2016, 04/09/2017, 03/08/2019, 03/27/2020, 03/22/2021, 03/10/2022, 04/04/2024   INFLUENZA, HIGH DOSE SEASONAL PF 03/02/2012, 04/06/2013, 04/21/2014, 04/16/2015, 03/06/2016, 04/09/2017, 03/01/2018   Influenza-Unspecified 04/12/2011   PFIZER(Purple Top)SARS-COV-2 Vaccination 07/02/2019, 07/23/2019, 09/16/2019, 03/06/2020, 03/20/2021   Pfizer Covid-19 Vaccine Bivalent Booster 2yrs & up 03/20/2021   Pneumococcal Conjugate-13 10/05/2014   Pneumococcal Polysaccharide-23 04/23/2010, 11/12/2015, 07/01/2016, 07/13/2017, 01/11/2018, 09/09/2021, 09/28/2023   Pneumococcal-Unspecified 04/23/2010   Tdap 05/23/2010, 01/24/2022   Zoster Recombinant(Shingrix) 11/18/2017, 03/23/2018   Zoster, Live 06/09/2004   We updated and reviewed the patient's past history in detail and it is documented below. Allergies: Patient is allergic to amlodipine, flexeril [cyclobenzaprine], ace inhibitors, adhesive [tape], angiotensin receptor blockers, chlor-trimeton [chlorpheniramine], corn-containing products, iodine, and triamterene-hctz. Past Medical History Patient  has a past medical history of Allergic asthma, mild intermittent, uncomplicated (05/25/2017), Arthritis, Chronic kidney disease, GERD (gastroesophageal reflux disease), Gout flare (01/17/2022), Hemorrhoids, History of kidney stones, Hypertension, IBS (irritable bowel syndrome), IFG (impaired fasting glucose) (03/09/2019), Nephrolithiasis (03/22/2021), and Psoriasis. Past Surgical History Patient  has a  past surgical history that includes Cholecystectomy; Hemorrhoid surgery; Cataract extraction w/ intraocular lens  implant, bilateral (Bilateral, 2015); Abdominal hysterectomy (1987); Tonsillectomy; and Spine surgery (07/10/2017). Family History: Patient family history includes Arthritis in her maternal aunt; COPD in her maternal aunt and maternal grandmother; Deep vein thrombosis in her maternal grandfather and paternal grandmother; Diabetes in her father, paternal uncle, and sister; Heart attack in her father; Heart disease in her father, maternal grandmother, and paternal uncle; Hyperlipidemia in her father; Hypertension in her daughter, father, maternal aunt, paternal grandmother, and sister; Stroke in her father, maternal grandfather, mother, and paternal uncle; Transient ischemic attack in her sister. Social History:  Patient  reports that she has never smoked. She has never used smokeless tobacco. She reports current alcohol use. She reports that she does not use drugs.  Review of Systems: Constitutional: negative for fever or malaise Ophthalmic: negative  for photophobia, double vision or loss of vision Cardiovascular: negative for chest pain, dyspnea on exertion, or new LE swelling Respiratory: negative for SOB or persistent cough Gastrointestinal: negative for abdominal pain, change in bowel habits or melena Genitourinary: negative for dysuria or gross hematuria, no abnormal uterine bleeding or disharge Musculoskeletal: negative for new gait disturbance or muscular weakness Integumentary: negative for new or persistent rashes, no breast lumps Neurological: negative for TIA or stroke symptoms Psychiatric: negative for SI or delusions Allergic/Immunologic: negative for hives  Patient Care Team    Relationship Specialty Notifications Start End  Jodie Lavern CROME, MD PCP - General Family Medicine  05/25/17   Frutoso Luz, MD Referring Physician Allergy  05/25/17   Lanis Pupa, MD  Consulting Physician Neurosurgery  10/28/17   [place], [place]  Dentistry  10/28/17   Porter Andrez JONELLE DEVONNA (Inactive) Physician Assistant Dermatology  09/18/21   Macel Jayson PARAS, MD Consulting Physician Nephrology  12/25/21     Objective  Vitals: BP 112/66 Comment: at recent nephro visit, documented; today anxious and in pain  Pulse (!) 54   Temp 97.7 F (36.5 C)   Ht 5' 4 (1.626 m)   Wt 174 lb 3.2 oz (79 kg)   SpO2 97%   BMI 29.90 kg/m  General:  Well developed, well nourished, no acute distress  Psych:  Alert and orientedx3,normal mood and affect HEENT:  Normocephalic, atraumatic, non-icteric sclera,  supple neck without adenopathy, mass or thyromegaly Cardiovascular:  Normal S1, S2, RRR without gallop, rub or murmur Respiratory:  Good breath sounds bilaterally, CTAB with normal respiratory effort Gastrointestinal: normal bowel sounds, soft, non-tender, no noted masses. No HSM MSK: extremities without edema, joints without erythema or swelling Neg SLR bilateral with normal LE reflexes and strength Neurologic:    Mental status is normal.  Gross motor and sensory exams are normal.  No tremor  Commons side effects, risks, benefits, and alternatives for medications and treatment plan prescribed today were discussed, and the patient expressed understanding of the given instructions. Patient is instructed to call or message via MyChart if he/she has any questions or concerns regarding our treatment plan. No barriers to understanding were identified. We discussed Red Flag symptoms and signs in detail. Patient expressed understanding regarding what to do in case of urgent or emergency type symptoms.  Medication list was reconciled, printed and provided to the patient in AVS. Patient instructions and summary information was reviewed with the patient as documented in the AVS. This note was prepared with assistance of Dragon voice recognition software. Occasional wrong-word or sound-a-like  substitutions may have occurred due to the inherent limitations of voice recognition software     "

## 2024-07-22 ENCOUNTER — Ambulatory Visit: Admitting: Physical Therapy

## 2025-01-02 ENCOUNTER — Ambulatory Visit: Admitting: Family Medicine

## 2025-03-29 ENCOUNTER — Ambulatory Visit

## 2025-04-05 ENCOUNTER — Ambulatory Visit: Admitting: Physician Assistant
